# Patient Record
Sex: Male | Born: 1935 | Race: White | Hispanic: No | Marital: Married | Smoking: Never smoker
Health system: Southern US, Community
[De-identification: ages and names within clinical notes are randomized; demographics above are authoritative.]

## PROBLEM LIST (undated history)

## (undated) DIAGNOSIS — A159 Respiratory tuberculosis unspecified: Secondary | ICD-10-CM

## (undated) DIAGNOSIS — I639 Cerebral infarction, unspecified: Secondary | ICD-10-CM

## (undated) DIAGNOSIS — I10 Essential (primary) hypertension: Secondary | ICD-10-CM

## (undated) DIAGNOSIS — K4090 Unilateral inguinal hernia, without obstruction or gangrene, not specified as recurrent: Secondary | ICD-10-CM

## (undated) DIAGNOSIS — Z923 Personal history of irradiation: Secondary | ICD-10-CM

## (undated) DIAGNOSIS — G4700 Insomnia, unspecified: Principal | ICD-10-CM

## (undated) DIAGNOSIS — H269 Unspecified cataract: Secondary | ICD-10-CM

## (undated) DIAGNOSIS — R21 Rash and other nonspecific skin eruption: Secondary | ICD-10-CM

## (undated) DIAGNOSIS — C029 Malignant neoplasm of tongue, unspecified: Secondary | ICD-10-CM

## (undated) DIAGNOSIS — Z978 Presence of other specified devices: Secondary | ICD-10-CM

## (undated) DIAGNOSIS — E785 Hyperlipidemia, unspecified: Secondary | ICD-10-CM

## (undated) DIAGNOSIS — E119 Type 2 diabetes mellitus without complications: Secondary | ICD-10-CM

## (undated) DIAGNOSIS — T884XXA Failed or difficult intubation, initial encounter: Secondary | ICD-10-CM

## (undated) HISTORY — DX: Essential (primary) hypertension: I10

## (undated) HISTORY — DX: Type 2 diabetes mellitus without complications: E11.9

## (undated) HISTORY — PX: EYE SURGERY: SHX253

## (undated) HISTORY — DX: Insomnia, unspecified: G47.00

## (undated) HISTORY — DX: Unilateral inguinal hernia, without obstruction or gangrene, not specified as recurrent: K40.90

## (undated) HISTORY — DX: Unspecified cataract: H26.9

## (undated) HISTORY — DX: Hyperlipidemia, unspecified: E78.5

## (undated) HISTORY — DX: Rash and other nonspecific skin eruption: R21

## (undated) HISTORY — DX: Malignant neoplasm of tongue, unspecified: C02.9

## (undated) HISTORY — DX: Cerebral infarction, unspecified: I63.9

---

## 1999-02-14 DIAGNOSIS — I639 Cerebral infarction, unspecified: Secondary | ICD-10-CM

## 1999-02-14 HISTORY — DX: Cerebral infarction, unspecified: I63.9

## 2000-10-28 ENCOUNTER — Inpatient Hospital Stay (HOSPITAL_COMMUNITY): Admission: EM | Admit: 2000-10-28 | Discharge: 2000-10-31 | Payer: Self-pay | Admitting: Emergency Medicine

## 2000-10-28 ENCOUNTER — Encounter: Payer: Self-pay | Admitting: Emergency Medicine

## 2000-11-02 ENCOUNTER — Encounter: Admission: RE | Admit: 2000-11-02 | Discharge: 2000-11-12 | Payer: Self-pay | Admitting: Neurology

## 2004-12-27 ENCOUNTER — Ambulatory Visit: Payer: Self-pay | Admitting: Sports Medicine

## 2005-01-31 ENCOUNTER — Ambulatory Visit: Payer: Self-pay | Admitting: Sports Medicine

## 2011-09-19 DIAGNOSIS — E785 Hyperlipidemia, unspecified: Secondary | ICD-10-CM | POA: Diagnosis not present

## 2011-09-19 DIAGNOSIS — I10 Essential (primary) hypertension: Secondary | ICD-10-CM | POA: Diagnosis not present

## 2011-09-19 DIAGNOSIS — E119 Type 2 diabetes mellitus without complications: Secondary | ICD-10-CM | POA: Diagnosis not present

## 2011-09-25 DIAGNOSIS — Z1212 Encounter for screening for malignant neoplasm of rectum: Secondary | ICD-10-CM | POA: Diagnosis not present

## 2011-09-27 DIAGNOSIS — E11339 Type 2 diabetes mellitus with moderate nonproliferative diabetic retinopathy without macular edema: Secondary | ICD-10-CM | POA: Diagnosis not present

## 2011-09-27 DIAGNOSIS — Z125 Encounter for screening for malignant neoplasm of prostate: Secondary | ICD-10-CM | POA: Diagnosis not present

## 2011-09-27 DIAGNOSIS — Z0000 Encounter for general adult medical examination without abnormal findings: Secondary | ICD-10-CM | POA: Diagnosis not present

## 2012-01-22 DIAGNOSIS — E1139 Type 2 diabetes mellitus with other diabetic ophthalmic complication: Secondary | ICD-10-CM | POA: Diagnosis not present

## 2012-01-22 DIAGNOSIS — H35049 Retinal micro-aneurysms, unspecified, unspecified eye: Secondary | ICD-10-CM | POA: Diagnosis not present

## 2012-01-22 DIAGNOSIS — E11339 Type 2 diabetes mellitus with moderate nonproliferative diabetic retinopathy without macular edema: Secondary | ICD-10-CM | POA: Diagnosis not present

## 2012-01-25 ENCOUNTER — Telehealth (INDEPENDENT_AMBULATORY_CARE_PROVIDER_SITE_OTHER): Payer: Self-pay | Admitting: General Surgery

## 2012-01-25 NOTE — Telephone Encounter (Signed)
Called and left message for patient to return call. Need to know if he was seen recently by any physician who confirmed he has an inguinal hernia. Also need to know if any tests have been performed as they are needed for review before appointment on 01/31/12 with Dr. Derrell Lolling.

## 2012-01-26 ENCOUNTER — Telehealth (INDEPENDENT_AMBULATORY_CARE_PROVIDER_SITE_OTHER): Payer: Self-pay | Admitting: General Surgery

## 2012-01-26 NOTE — Telephone Encounter (Signed)
Pt returned call; he has not seen physician but is self-diagnosed.  He has been able to reduce the hernia twice and will provide a full history at the time of his appt.

## 2012-01-31 ENCOUNTER — Ambulatory Visit (INDEPENDENT_AMBULATORY_CARE_PROVIDER_SITE_OTHER): Payer: Medicare Other | Admitting: General Surgery

## 2012-01-31 ENCOUNTER — Encounter (INDEPENDENT_AMBULATORY_CARE_PROVIDER_SITE_OTHER): Payer: Self-pay | Admitting: General Surgery

## 2012-01-31 VITALS — BP 118/68 | HR 70 | Temp 97.8°F | Resp 16 | Ht 74.0 in | Wt 186.1 lb

## 2012-01-31 DIAGNOSIS — K4090 Unilateral inguinal hernia, without obstruction or gangrene, not specified as recurrent: Secondary | ICD-10-CM

## 2012-01-31 NOTE — Patient Instructions (Signed)
Your examination reveals a reducible right inguinal hernia. There is no evidence of hernia on the side.  You will be scheduled for open repair of your right inguinal hernia with mesh.    Inguinal Hernia, Adult Muscles help keep everything in the body in its proper place. But if a weak spot in the muscles develops, something can poke through. That is called a hernia. When this happens in the lower part of the belly (abdomen), it is called an inguinal hernia. (It takes its name from a part of the body in this region called the inguinal canal.) A weak spot in the wall of muscles lets some fat or part of the small intestine bulge through. An inguinal hernia can develop at any age. Men get them more often than women. CAUSES  In adults, an inguinal hernia develops over time.  It can be triggered by:  Suddenly straining the muscles of the lower abdomen.  Lifting heavy objects.  Straining to have a bowel movement. Difficult bowel movements (constipation) can lead to this.  Constant coughing. This may be caused by smoking or lung disease.  Being overweight.  Being pregnant.  Working at a job that requires long periods of standing or heavy lifting.  Having had an inguinal hernia before. One type can be an emergency situation. It is called a strangulated inguinal hernia. It develops if part of the small intestine slips through the weak spot and cannot get back into the abdomen. The blood supply can be cut off. If that happens, part of the intestine may die. This situation requires emergency surgery. SYMPTOMS  Often, a small inguinal hernia has no symptoms. It is found when a healthcare provider does a physical exam. Larger hernias usually have symptoms.   In adults, symptoms may include:  A lump in the groin. This is easier to see when the person is standing. It might disappear when lying down.  In men, a lump in the scrotum.  Pain or burning in the groin. This occurs especially when  lifting, straining or coughing.  A dull ache or feeling of pressure in the groin.  Signs of a strangulated hernia can include:  A bulge in the groin that becomes very painful and tender to the touch.  A bulge that turns red or purple.  Fever, nausea and vomiting.  Inability to have a bowel movement or to pass gas. DIAGNOSIS  To decide if you have an inguinal hernia, a healthcare provider will probably do a physical examination.  This will include asking questions about any symptoms you have noticed.  The healthcare provider might feel the groin area and ask you to cough. If an inguinal hernia is felt, the healthcare provider may try to slide it back into the abdomen.  Usually no other tests are needed. TREATMENT  Treatments can vary. The size of the hernia makes a difference. Options include:  Watchful waiting. This is often suggested if the hernia is small and you have had no symptoms.  No medical procedure will be done unless symptoms develop.  You will need to watch closely for symptoms. If any occur, contact your healthcare provider right away.  Surgery. This is used if the hernia is larger or you have symptoms.  Open surgery. This is usually an outpatient procedure (you will not stay overnight in a hospital). An cut (incision) is made through the skin in the groin. The hernia is put back inside the abdomen. The weak area in the muscles is then repaired  by herniorrhaphy or hernioplasty. Herniorrhaphy: in this type of surgery, the weak muscles are sewn back together. Hernioplasty: a patch or mesh is used to close the weak area in the abdominal wall.  Laparoscopy. In this procedure, a surgeon makes small incisions. A thin tube with a tiny video camera (called a laparoscope) is put into the abdomen. The surgeon repairs the hernia with mesh by looking with the video camera and using two long instruments. HOME CARE INSTRUCTIONS   After surgery to repair an inguinal hernia:  You  will need to take pain medicine prescribed by your healthcare provider. Follow all directions carefully.  You will need to take care of the wound from the incision.  Your activity will be restricted for awhile. This will probably include no heavy lifting for several weeks. You also should not do anything too active for a few weeks. When you can return to work will depend on the type of job that you have.  During "watchful waiting" periods, you should:  Maintain a healthy weight.  Eat a diet high in fiber (fruits, vegetables and whole grains).  Drink plenty of fluids to avoid constipation. This means drinking enough water and other liquids to keep your urine clear or pale yellow.  Do not lift heavy objects.  Do not stand for long periods of time.  Quit smoking. This should keep you from developing a frequent cough. SEEK MEDICAL CARE IF:   A bulge develops in your groin area.  You feel pain, a burning sensation or pressure in the groin. This might be worse if you are lifting or straining.  You develop a fever of more than 100.5 F (38.1 C). SEEK IMMEDIATE MEDICAL CARE IF:   Pain in the groin increases suddenly.  A bulge in the groin gets bigger suddenly and does not go down.  For men, there is sudden pain in the scrotum. Or, the size of the scrotum increases.  A bulge in the groin area becomes red or purple and is painful to touch.  You have nausea or vomiting that does not go away.  You feel your heart beating much faster than normal.  You cannot have a bowel movement or pass gas.  You develop a fever of more than 102.0 F (38.9 C). Document Released: 06/18/2008 Document Revised: 04/24/2011 Document Reviewed: 06/18/2008 [facility] Patient Information 2013 [place], [place].

## 2012-01-31 NOTE — Progress Notes (Signed)
Patient ID: Brent Franklin, male   DOB: August 17, 1931, 78 y.o.   MRN: [id number]  Chief Complaint  Patient presents with  . Inguinal Hernia    HPI Brent Franklin is a 78 y.o. male.  He is a self-referred for evaluation and management of a symptomatic right inguinal hernia. Dr. Sigmund Hazel his primary care physician. He also sees Fawn Kirk periodically for surveillance of his diabetic retinopathy.  Park Breed has never had a hernia before. He was lifting some furniture at church recently and developed right groin pain, a small bulge and had to stop and push it back in. He's had a second episode of incarceration that he had to recline in his car and push it back in. No abdominal pain or vomiting. He feels fine today. He called and set up an appointment to see me.  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 No major surgical problems. HPI  Past Medical History  Diagnosis Date  . Diabetes mellitus without complication   . Hyperlipidemia   . Stroke 2001  . Inguinal hernia     Past Surgical History  Procedure Date  . Eye surgery 2000, 2001    cataract    Family History  Problem Relation Age of Onset  . Cancer Mother     breast    Social History History  Substance Use Topics  . Smoking status: Never Smoker   . Smokeless tobacco: Never Used  . Alcohol Use: Yes     Comment: glass of wine - 4 days a week    No Known Allergies  Current Outpatient Prescriptions  Medication Sig Dispense Refill  . aspirin 81 MG tablet Take 81 mg by mouth daily.      Marland Kitchen glipiZIDE (GLUCOTROL) 5 MG tablet Take 5 mg by mouth daily.      Marland Kitchen LIPITOR 10 MG tablet Daily.      . metFORMIN (GLUCOPHAGE) 850 MG tablet Take 850 mg by mouth 2 (two) times daily with a meal.      . valsartan-hydrochlorothiazide (DIOVAN-HCT) 320-12.5 MG per tablet Take 1 tablet by mouth daily.        Review of Systems Review of Systems  Constitutional:  Negative for fever, chills and unexpected weight change.  HENT: Negative for hearing loss, congestion, sore throat, trouble swallowing and voice change.   Eyes: Negative for visual disturbance.  Respiratory: Negative for cough and wheezing.   Cardiovascular: Negative for chest pain, palpitations and leg swelling.  Gastrointestinal: Negative for nausea, vomiting, abdominal pain, diarrhea, constipation, blood in stool, abdominal distention, anal bleeding and rectal pain.  Genitourinary: Negative for hematuria and difficulty urinating.  Musculoskeletal: Negative for arthralgias.  Skin: Negative for rash and wound.  Neurological: Negative for seizures, syncope, weakness and headaches.  Hematological: Negative for adenopathy. Does not bruise/bleed easily.  Psychiatric/Behavioral: Negative for confusion.    Blood pressure 118/68, pulse 70, temperature 97.8 F (36.6 C), temperature source Temporal, resp. rate 16, height 6\' 2"  (1.88 m), weight 186 lb 2 oz (84.426 kg).  Physical Exam Physical Exam  Constitutional: He is oriented to person, place, and time. He appears well-developed and well-nourished. No distress.  HENT:  Head: Normocephalic.  Nose: Nose normal.  Mouth/Throat: No oropharyngeal exudate.  Eyes: Conjunctivae normal and EOM are normal. Pupils are equal, round, and reactive to light. Right eye exhibits no discharge. Left eye exhibits no discharge. No scleral icterus.  Neck: Normal range of motion. Neck supple.  No JVD present. No tracheal deviation present. No thyromegaly present.  Cardiovascular: Normal rate, regular rhythm, normal heart sounds and intact distal pulses.   No murmur heard. Pulmonary/Chest: Effort normal and breath sounds normal. No stridor. No respiratory distress. He has no wheezes. He has no rales. He exhibits no tenderness.  Abdominal: Soft. Bowel sounds are normal. He exhibits no distension and no mass. There is no tenderness. There is no rebound and no guarding.        Diastases recti  Genitourinary:       Medium size right inguinal hernia, does not extend into the scrotum, does not extend past the external ring. Easily reducible. No evidence of hernia on the left side. Penis scrotum and testes are normal. No scrotal mass.  Musculoskeletal: Normal range of motion. He exhibits no edema and no tenderness.  Lymphadenopathy:    He has no cervical adenopathy.  Neurological: He is alert and oriented to person, place, and time. He has normal reflexes. Coordination normal.  Skin: Skin is warm and dry. No rash noted. He is not diaphoretic. No erythema. No pallor.  Psychiatric: He has a normal mood and affect. His behavior is normal. Judgment and thought content normal.    Data Reviewed None  Assessment    Right inguinal hernia, recent onset, two episodes of painful incarceration, self reduced. Elective repair  is indicated  Type 2 diabetes, controlled on oral medication  Hypertension, controlled  Prior cerebellar stroke without residual  History cataract surgery  History diabetic retinopathy    Plan    He would like to have his inguinal hernia repair performed in the near future, which is appropriate considering the recent episodes of incarceration. I spent a long time discussing this. I discussed Cytogeneticist, laparoscopic repair, philosophy and experience with the use of mesh. We talked about long-term results and short term disability issues. At the end of the conversation he stated that he would like to have an open repair of his right inguinal hernia with mesh.  We will schedule him for elective open repair of right inguinal hernia with mesh under general anesthesia in the near future.  I discussed the indications, details, techniques, and the numerous risks of the surgery with him. I reviewed patient information booklets and diagrams. He has an excellent understanding of these issues because of his medical background. His questions  were answered. He agrees with this plan.       Angelia Mould. Derrell Lolling, M.D., [facility] General and Minimally invasive Surgery Breast and Colorectal Surgery Office:   [phone] Pager:   [phone]  01/31/2012, 5:01 PM

## 2012-02-01 ENCOUNTER — Encounter (HOSPITAL_COMMUNITY): Payer: Self-pay | Admitting: Pharmacy Technician

## 2012-02-05 ENCOUNTER — Encounter (HOSPITAL_COMMUNITY)
Admission: RE | Admit: 2012-02-05 | Discharge: 2012-02-05 | Disposition: A | Discharge status: Home / Self Care | Payer: Medicare Other | Source: Ambulatory Visit | Attending: General Surgery | Admitting: General Surgery

## 2012-02-05 ENCOUNTER — Ambulatory Visit (HOSPITAL_COMMUNITY)
Admission: RE | Admit: 2012-02-05 | Discharge: 2012-02-05 | Disposition: A | Discharge status: Home / Self Care | Payer: Medicare Other | Source: Ambulatory Visit | Attending: Anesthesiology | Admitting: Anesthesiology

## 2012-02-05 ENCOUNTER — Encounter (HOSPITAL_COMMUNITY): Payer: Self-pay

## 2012-02-05 DIAGNOSIS — K4090 Unilateral inguinal hernia, without obstruction or gangrene, not specified as recurrent: Secondary | ICD-10-CM | POA: Diagnosis not present

## 2012-02-05 DIAGNOSIS — Z8673 Personal history of transient ischemic attack (TIA), and cerebral infarction without residual deficits: Secondary | ICD-10-CM | POA: Diagnosis not present

## 2012-02-05 DIAGNOSIS — E119 Type 2 diabetes mellitus without complications: Secondary | ICD-10-CM | POA: Diagnosis not present

## 2012-02-05 DIAGNOSIS — E785 Hyperlipidemia, unspecified: Secondary | ICD-10-CM | POA: Diagnosis not present

## 2012-02-05 DIAGNOSIS — Z01818 Encounter for other preprocedural examination: Secondary | ICD-10-CM | POA: Diagnosis not present

## 2012-02-05 DIAGNOSIS — I10 Essential (primary) hypertension: Secondary | ICD-10-CM | POA: Diagnosis not present

## 2012-02-05 DIAGNOSIS — Z9849 Cataract extraction status, unspecified eye: Secondary | ICD-10-CM | POA: Diagnosis not present

## 2012-02-05 LAB — CBC
MCH: 28.7 pg (ref 26.0–34.0)
MCHC: 32.7 g/dL (ref 30.0–36.0)
Platelets: 163 10*3/uL (ref 150–400)
RBC: 4.81 MIL/uL (ref 4.22–5.81)

## 2012-02-05 LAB — BASIC METABOLIC PANEL
Calcium: 9.2 mg/dL (ref 8.4–10.5)
GFR calc Af Amer: 89 mL/min — ABNORMAL LOW (ref >90)
GFR calc non Af Amer: 77 mL/min — ABNORMAL LOW (ref >90)
Glucose, Bld: 231 mg/dL — ABNORMAL HIGH (ref 70–99)
Sodium: 138 mEq/L (ref 135–145)

## 2012-02-05 LAB — SURGICAL PCR SCREEN: MRSA, PCR: NEGATIVE

## 2012-02-05 NOTE — Pre-Procedure Instructions (Signed)
[name]  02/05/2012   Your procedure is scheduled on:  Tues, Dec 31 @ 12:20 PM  Report to [facility] at 10:15 AM.  Call this number if you have problems the morning of surgery: [phone]   Remember:   Do not eat food:After Midnight.       Do not wear jewelry  Do not wear lotions, powders, or colognes. You may wear deodorant.  Men may shave face and neck.  Do not bring valuables to the hospital.  Contacts, dentures or bridgework may not be worn into surgery.  Leave suitcase in the car. After surgery it may be brought to your room.  For patients admitted to the hospital, checkout time is 11:00 AM the day of discharge.   Patients discharged the day of surgery will not be allowed to drive home.    Special Instructions: Shower using CHG 2 nights before surgery and the night before surgery.  If you shower the day of surgery use CHG.  Use special wash - you have one bottle of CHG for all showers.  You should use approximately 1/3 of the bottle for each shower.   Please read over the following fact sheets that you were given: Pain Booklet, Coughing and Deep Breathing, MRSA Information and Surgical Site Infection Prevention

## 2012-02-08 NOTE — H&P (Signed)
Brent Franklin    MRN: [id number]   Description: 78 year old male  Provider: Ernestene Mention, MD  Department: [facility]       Diagnoses     Right inguinal hernia   - Primary    550.90        Vitals    BP Pulse Temp Resp Ht Wt    118/68 70 97.8 F (36.6 C) (Temporal) 16 6\' 2"  (1.88 m) 186 lb 2 oz (84.426 kg)   BMI -23.90 kg/m2                 History and Physical   Ernestene Mention, MD  Patient ID: Brent Franklin, male   DOB: 1931/09/24, 78 y.o.   MRN: [id number]             HPI Brent Franklin is a 78 y.o. male.  He is a self-referred for evaluation and management of a symptomatic right inguinal hernia. Dr. Felipa Eth his primary care physician. He also sees Fawn Kirk periodically for surveillance of his diabetic retinopathy.   Park Breed has never had a hernia before. He was lifting some furniture at church recently and developed right groin pain, a small bulge and had to stop and push it back in. He's had a second episode of incarceration that he had to recline in his car and push it back in. No abdominal pain or vomiting. He feels fine today. He called and set up an appointment to see me.   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 No major surgical problems.       Past Medical History   Diagnosis  Date   .  Diabetes mellitus without complication     .  Hyperlipidemia     .  Stroke  2001   .  Inguinal hernia         Past Surgical History   Procedure  Date   .  Eye surgery  2000, 2001       cataract       Family History   Problem  Relation  Age of Onset   .  Cancer  Mother         breast      Social History History   Substance Use Topics   .  Smoking status:  Never Smoker    .  Smokeless tobacco:  Never Used   .  Alcohol Use:  Yes         Comment: glass of wine - 4 days a week      No Known Allergies    Current Outpatient Prescriptions   Medication   Sig  Dispense  Refill   .  aspirin 81 MG tablet  Take 81 mg by mouth daily.         Marland Kitchen  glipiZIDE (GLUCOTROL) 5 MG tablet  Take 5 mg by mouth daily.         Marland Kitchen  LIPITOR 10 MG tablet  Daily.         .  metFORMIN (GLUCOPHAGE) 850 MG tablet  Take 850 mg by mouth 2 (two) times daily with a meal.         .  valsartan-hydrochlorothiazide (DIOVAN-HCT) 320-12.5 MG per tablet  Take 1 tablet by mouth daily.            Review of Systems   Constitutional: Negative for fever, chills and unexpected  weight change.  HENT: Negative for hearing loss, congestion, sore throat, trouble swallowing and voice change.   Eyes: Negative for visual disturbance.  Respiratory: Negative for cough and wheezing.   Cardiovascular: Negative for chest pain, palpitations and leg swelling.  Gastrointestinal: Negative for nausea, vomiting, abdominal pain, diarrhea, constipation, blood in stool, abdominal distention, anal bleeding and rectal pain.  Genitourinary: Negative for hematuria and difficulty urinating.  Musculoskeletal: Negative for arthralgias.  Skin: Negative for rash and wound.  Neurological: Negative for seizures, syncope, weakness and headaches.  Hematological: Negative for adenopathy. Does not bruise/bleed easily.  Psychiatric/Behavioral: Negative for confusion.    Blood pressure 118/68, pulse 70, temperature 97.8 F (36.6 C), temperature source Temporal, resp. rate 16, height 6\' 2"  (1.88 m), weight 186 lb 2 oz (84.426 kg).   Physical Exam   Constitutional: He is oriented to person, place, and time. He appears well-developed and well-nourished. No distress.  HENT:   Head: Normocephalic.   Nose: Nose normal.   Mouth/Throat: No oropharyngeal exudate.  Eyes: Conjunctivae normal and EOM are normal. Pupils are equal, round, and reactive to light. Right eye exhibits no discharge. Left eye exhibits no discharge. No scleral icterus.  Neck: Normal range of motion. Neck supple. No JVD present. No tracheal deviation  present. No thyromegaly present.  Cardiovascular: Normal rate, regular rhythm, normal heart sounds and intact distal pulses.    No murmur heard. Pulmonary/Chest: Effort normal and breath sounds normal. No stridor. No respiratory distress. He has no wheezes. He has no rales. He exhibits no tenderness.  Abdominal: Soft. Bowel sounds are normal. He exhibits no distension and no mass. There is no tenderness. There is no rebound and no guarding.       Diastases recti  Genitourinary:       Medium size right inguinal hernia, does not extend into the scrotum, does not extend past the external ring. Easily reducible. No evidence of hernia on the left side. Penis scrotum and testes are normal. No scrotal mass.  Musculoskeletal: Normal range of motion. He exhibits no edema and no tenderness.  Lymphadenopathy:    He has no cervical adenopathy.  Neurological: He is alert and oriented to person, place, and time. He has normal reflexes. Coordination normal.  Skin: Skin is warm and dry. No rash noted. He is not diaphoretic. No erythema. No pallor.  Psychiatric: He has a normal mood and affect. His behavior is normal. Judgment and thought content normal.       Assessment Right inguinal hernia, recent onset, two episodes of painful incarceration, self reduced. Elective repair  is indicated   Type 2 diabetes, controlled on oral medication   Hypertension, controlled   Prior cerebellar stroke without residual   History cataract surgery   History diabetic retinopathy   Plan He would like to have his inguinal hernia repair performed in the near future, which is appropriate considering the recent episodes of incarceration. I spent a long time discussing this. I discussed Cytogeneticist, laparoscopic repair, philosophy and experience with the use of mesh. We talked about long-term results and short term disability issues. At the end of the conversation he stated that he would like to have an open  repair of his right inguinal hernia with mesh.   We will schedule him for elective open repair of right inguinal hernia with mesh under general anesthesia in the near future.   I discussed the indications, details, techniques, and the numerous risks of the surgery with him. I reviewed patient information  booklets and diagrams. He has an excellent understanding of these issues because of his medical background. His questions were answered. He agrees with this plan.       Angelia Mould. Derrell Lolling, M.D., [facility] General and Minimally invasive Surgery Breast and Colorectal Surgery Office:   [phone] Pager:   [phone]

## 2012-02-12 MED ORDER — CEFAZOLIN SODIUM-DEXTROSE 2-3 GM-% IV SOLR
2.0000 g | INTRAVENOUS | Status: AC
  Administered 2012-02-13: 2 g via INTRAVENOUS
  Filled 2012-02-12: qty 50

## 2012-02-12 MED ORDER — HEPARIN SODIUM (PORCINE) 5000 UNIT/ML IJ SOLN
5000.0000 [IU] | Freq: Once | INTRAMUSCULAR | Status: DC
  Filled 2012-02-12: qty 1

## 2012-02-12 MED ORDER — CHLORHEXIDINE GLUCONATE 4 % EX LIQD: 1.0000 "application " | Freq: Once | CUTANEOUS | Status: DC

## 2012-02-13 ENCOUNTER — Ambulatory Visit (HOSPITAL_COMMUNITY): Payer: Medicare Other | Admitting: *Deleted

## 2012-02-13 ENCOUNTER — Ambulatory Visit (HOSPITAL_COMMUNITY)
Admission: RE | Admit: 2012-02-13 | Discharge: 2012-02-13 | Disposition: A | Discharge status: Home / Self Care | Payer: Medicare Other | Source: Ambulatory Visit | Attending: General Surgery | Admitting: General Surgery

## 2012-02-13 ENCOUNTER — Encounter (HOSPITAL_COMMUNITY): Payer: Self-pay | Admitting: *Deleted

## 2012-02-13 ENCOUNTER — Encounter (HOSPITAL_COMMUNITY)
Admission: RE | Disposition: A | Discharge status: Home / Self Care | Payer: Self-pay | Source: Ambulatory Visit | Attending: General Surgery

## 2012-02-13 DIAGNOSIS — Z9849 Cataract extraction status, unspecified eye: Secondary | ICD-10-CM | POA: Insufficient documentation

## 2012-02-13 DIAGNOSIS — Z8673 Personal history of transient ischemic attack (TIA), and cerebral infarction without residual deficits: Secondary | ICD-10-CM | POA: Insufficient documentation

## 2012-02-13 DIAGNOSIS — E119 Type 2 diabetes mellitus without complications: Secondary | ICD-10-CM | POA: Diagnosis not present

## 2012-02-13 DIAGNOSIS — I10 Essential (primary) hypertension: Secondary | ICD-10-CM | POA: Insufficient documentation

## 2012-02-13 DIAGNOSIS — K4090 Unilateral inguinal hernia, without obstruction or gangrene, not specified as recurrent: Secondary | ICD-10-CM | POA: Diagnosis not present

## 2012-02-13 DIAGNOSIS — I6789 Other cerebrovascular disease: Secondary | ICD-10-CM | POA: Diagnosis not present

## 2012-02-13 HISTORY — PX: INGUINAL HERNIA REPAIR: SHX194

## 2012-02-13 HISTORY — PX: INSERTION OF MESH: SHX5868

## 2012-02-13 LAB — GLUCOSE, CAPILLARY
Glucose-Capillary: 106 mg/dL — ABNORMAL HIGH (ref 70–99)
Glucose-Capillary: 110 mg/dL — ABNORMAL HIGH (ref 70–99)

## 2012-02-13 SURGERY — REPAIR, HERNIA, INGUINAL, ADULT
Anesthesia: General | Laterality: Right | Wound class: Clean

## 2012-02-13 MED ORDER — LACTATED RINGERS IV SOLN
INTRAVENOUS | Status: DC
  Administered 2012-02-13 (×3): via INTRAVENOUS

## 2012-02-13 MED ORDER — PHENYLEPHRINE HCL 10 MG/ML IJ SOLN
INTRAMUSCULAR | Status: DC | PRN
  Administered 2012-02-13: 80 ug via INTRAVENOUS

## 2012-02-13 MED ORDER — EPHEDRINE SULFATE 50 MG/ML IJ SOLN
INTRAMUSCULAR | Status: DC | PRN
  Administered 2012-02-13 (×2): 5 mg via INTRAVENOUS

## 2012-02-13 MED ORDER — BUPIVACAINE HCL (PF) 0.5 % IJ SOLN
INTRAMUSCULAR | Status: AC
  Filled 2012-02-13: qty 30

## 2012-02-13 MED ORDER — FENTANYL CITRATE 0.05 MG/ML IJ SOLN
INTRAMUSCULAR | Status: DC | PRN
  Administered 2012-02-13: 50 ug via INTRAVENOUS
  Administered 2012-02-13 (×2): 100 ug via INTRAVENOUS

## 2012-02-13 MED ORDER — HEPARIN SODIUM (PORCINE) 5000 UNIT/ML IJ SOLN
5000.0000 [IU] | Freq: Once | INTRAMUSCULAR | Status: AC
  Administered 2012-02-13: 5000 [IU] via SUBCUTANEOUS

## 2012-02-13 MED ORDER — ROCURONIUM BROMIDE 100 MG/10ML IV SOLN
INTRAVENOUS | Status: DC | PRN
  Administered 2012-02-13: 50 mg via INTRAVENOUS

## 2012-02-13 MED ORDER — ONDANSETRON HCL 4 MG/2ML IJ SOLN
INTRAMUSCULAR | Status: DC | PRN
  Administered 2012-02-13: 4 mg via INTRAVENOUS

## 2012-02-13 MED ORDER — MEPERIDINE HCL 25 MG/ML IJ SOLN: 6.2500 mg | INTRAMUSCULAR | Status: DC | PRN

## 2012-02-13 MED ORDER — PROPOFOL 10 MG/ML IV BOLUS
INTRAVENOUS | Status: DC | PRN
  Administered 2012-02-13: 120 mg via INTRAVENOUS

## 2012-02-13 MED ORDER — OXYCODONE HCL 5 MG PO TABS: 5.0000 mg | ORAL_TABLET | Freq: Once | ORAL | Status: DC | PRN

## 2012-02-13 MED ORDER — DEXAMETHASONE SODIUM PHOSPHATE 4 MG/ML IJ SOLN
INTRAMUSCULAR | Status: DC | PRN
  Administered 2012-02-13: 4 mg via INTRAVENOUS

## 2012-02-13 MED ORDER — ONDANSETRON HCL 4 MG/2ML IJ SOLN: 4.0000 mg | Freq: Once | INTRAMUSCULAR | Status: DC | PRN

## 2012-02-13 MED ORDER — BUPIVACAINE LIPOSOME 1.3 % IJ SUSP
20.0000 mL | INTRAMUSCULAR | Status: AC
  Administered 2012-02-13: 18.5 mL
  Filled 2012-02-13: qty 20

## 2012-02-13 MED ORDER — MIDAZOLAM HCL 5 MG/5ML IJ SOLN
INTRAMUSCULAR | Status: DC | PRN
  Administered 2012-02-13 (×2): 1 mg via INTRAVENOUS

## 2012-02-13 MED ORDER — NEOSTIGMINE METHYLSULFATE 1 MG/ML IJ SOLN
INTRAMUSCULAR | Status: DC | PRN
  Administered 2012-02-13: 5 mg via INTRAVENOUS

## 2012-02-13 MED ORDER — OXYCODONE-ACETAMINOPHEN 7.5-325 MG PO TABS: 1.0000 | ORAL_TABLET | ORAL | Status: DC | PRN

## 2012-02-13 MED ORDER — 0.9 % SODIUM CHLORIDE (POUR BTL) OPTIME
TOPICAL | Status: DC | PRN
  Administered 2012-02-13: 1000 mL

## 2012-02-13 MED ORDER — SODIUM CHLORIDE 0.9 % IJ SOLN
INTRAMUSCULAR | Status: AC
  Filled 2012-02-13: qty 6

## 2012-02-13 MED ORDER — GLYCOPYRROLATE 0.2 MG/ML IJ SOLN
INTRAMUSCULAR | Status: DC | PRN
  Administered 2012-02-13: .6 mg via INTRAVENOUS

## 2012-02-13 MED ORDER — LIDOCAINE-EPINEPHRINE (PF) 1 %-1:200000 IJ SOLN
INTRAMUSCULAR | Status: AC
  Filled 2012-02-13: qty 10

## 2012-02-13 MED ORDER — HYDROMORPHONE HCL PF 1 MG/ML IJ SOLN: 0.2500 mg | INTRAMUSCULAR | Status: DC | PRN

## 2012-02-13 MED ORDER — LIDOCAINE HCL (CARDIAC) 20 MG/ML IV SOLN
INTRAVENOUS | Status: DC | PRN
  Administered 2012-02-13: 100 mg via INTRAVENOUS

## 2012-02-13 MED ORDER — OXYCODONE HCL 5 MG/5ML PO SOLN: 5.0000 mg | Freq: Once | ORAL | Status: DC | PRN

## 2012-02-13 SURGICAL SUPPLY — 47 items
BLADE SURG 10 STRL SS (BLADE) ×3 IMPLANT
BLADE SURG 15 STRL LF DISP TIS (BLADE) ×2 IMPLANT
BLADE SURG 15 STRL SS (BLADE) ×1
BLADE SURG ROTATE 9660 (MISCELLANEOUS) ×3 IMPLANT
CANISTER SUCTION 2500CC (MISCELLANEOUS) ×3 IMPLANT
CHLORAPREP W/TINT 26ML (MISCELLANEOUS) ×3 IMPLANT
CLOTH BEACON ORANGE TIMEOUT ST (SAFETY) ×3 IMPLANT
COVER SURGICAL LIGHT HANDLE (MISCELLANEOUS) ×3 IMPLANT
DERMABOND ADVANCED (GAUZE/BANDAGES/DRESSINGS) ×1
DERMABOND ADVANCED .7 DNX12 (GAUZE/BANDAGES/DRESSINGS) ×2 IMPLANT
DRAIN PENROSE 1/2X12 LTX STRL (WOUND CARE) ×3 IMPLANT
DRAPE LAPAROTOMY TRNSV 102X78 (DRAPE) ×3 IMPLANT
DRAPE UTILITY 15X26 W/TAPE STR (DRAPE) ×6 IMPLANT
ELECT CAUTERY BLADE 6.4 (BLADE) ×3 IMPLANT
ELECT REM PT RETURN 9FT ADLT (ELECTROSURGICAL) ×3
ELECTRODE REM PT RTRN 9FT ADLT (ELECTROSURGICAL) ×2 IMPLANT
GLOVE EUDERMIC 7 POWDERFREE (GLOVE) ×3 IMPLANT
GOWN PREVENTION PLUS XLARGE (GOWN DISPOSABLE) ×3 IMPLANT
GOWN STRL NON-REIN LRG LVL3 (GOWN DISPOSABLE) ×3 IMPLANT
KIT BASIN OR (CUSTOM PROCEDURE TRAY) ×3 IMPLANT
KIT ROOM TURNOVER OR (KITS) ×3 IMPLANT
MESH ULTRAPRO 3X6 7.6X15CM (Mesh General) ×3 IMPLANT
NEEDLE HYPO 25GX1X1/2 BEV (NEEDLE) ×3 IMPLANT
NS IRRIG 1000ML POUR BTL (IV SOLUTION) ×3 IMPLANT
PACK SURGICAL SETUP 50X90 (CUSTOM PROCEDURE TRAY) ×3 IMPLANT
PAD ARMBOARD 7.5X6 YLW CONV (MISCELLANEOUS) ×3 IMPLANT
PENCIL BUTTON HOLSTER BLD 10FT (ELECTRODE) ×3 IMPLANT
SPECIMEN JAR SMALL (MISCELLANEOUS) IMPLANT
SPONGE INTESTINAL PEANUT (DISPOSABLE) ×3 IMPLANT
SPONGE LAP 18X18 X RAY DECT (DISPOSABLE) ×3 IMPLANT
SPONGE LAP 4X18 X RAY DECT (DISPOSABLE) ×3 IMPLANT
SUT MNCRL AB 4-0 PS2 18 (SUTURE) ×3 IMPLANT
SUT PROLENE 2 0 CT2 30 (SUTURE) ×6 IMPLANT
SUT SILK 2 0 (SUTURE) ×2
SUT SILK 2 0 SH (SUTURE) IMPLANT
SUT SILK 2-0 18XBRD TIE 12 (SUTURE) ×2 IMPLANT
SUT VIC AB 2-0 CT1 27 (SUTURE) ×1
SUT VIC AB 2-0 CT1 TAPERPNT 27 (SUTURE) ×2 IMPLANT
SUT VIC AB 3-0 SH 27 (SUTURE) ×1
SUT VIC AB 3-0 SH 27XBRD (SUTURE) ×2 IMPLANT
SUT VICRYL AB 2 0 TIES (SUTURE) ×3 IMPLANT
SYR BULB 3OZ (MISCELLANEOUS) ×3 IMPLANT
SYR CONTROL 10ML LL (SYRINGE) ×3 IMPLANT
TOWEL OR 17X24 6PK STRL BLUE (TOWEL DISPOSABLE) ×3 IMPLANT
TOWEL OR 17X26 10 PK STRL BLUE (TOWEL DISPOSABLE) ×3 IMPLANT
TUBE CONNECTING 12X1/4 (SUCTIONS) ×3 IMPLANT
YANKAUER SUCT BULB TIP NO VENT (SUCTIONS) ×3 IMPLANT

## 2012-02-13 NOTE — Anesthesia Preprocedure Evaluation (Addendum)
Anesthesia Evaluation  Patient identified by MRN, date of birth, ID band Patient awake    Reviewed: Allergy & Precautions, H&P , NPO status , Patient's Chart, lab work & pertinent test results, reviewed documented beta blocker date and time   Airway Mallampati: II TM Distance: >3 FB Neck ROM: Full    Dental  (+) Teeth Intact and Dental Advisory Given   Pulmonary          Cardiovascular hypertension, Pt. on medications     Neuro/Psych    GI/Hepatic   Endo/Other  diabetes, Type 2  Renal/GU      Musculoskeletal   Abdominal   Peds  Hematology   Anesthesia Other Findings   Reproductive/Obstetrics                           Anesthesia Physical Anesthesia Plan  ASA: II  Anesthesia Plan: General   Post-op Pain Management:    Induction: Intravenous  Airway Management Planned: LMA  Additional Equipment:   Intra-op Plan:   Post-operative Plan: Extubation in OR  Informed Consent: I have reviewed the patients History and Physical, chart, labs and discussed the procedure including the risks, benefits and alternatives for the proposed anesthesia with the patient or authorized representative who has indicated his/her understanding and acceptance.   Dental advisory given  Plan Discussed with: CRNA and Surgeon  Anesthesia Plan Comments:        Anesthesia Quick Evaluation

## 2012-02-13 NOTE — Op Note (Signed)
Patient Name:           Brent Franklin   Date of Surgery:        02/13/2012  Pre op Diagnosis:      Right inguinal hernia  Post op Diagnosis:    Direct, sliding, right inguinal hernia  Procedure:                 Repair right inguinal hernia with mesh Armanda Heritage repair)  Surgeon:                     Angelia Mould. Derrell Lolling, M.D., FACS  Assistant:                      none  Operative Indications:   Brent Franklin is a 78 y.o. Retired Development worker, community.Marland Kitchen He is a self-referred for evaluation and management of a symptomatic right inguinal hernia. Dr. Felipa Eth his primary care physician. He also sees Fawn Kirk periodically for surveillance of his diabetic retinopathy.  Brent Franklin has never had a hernia before. He was lifting some furniture at church recently and developed right groin pain, a small bulge and had to stop and push it back in. He's had a second episode of incarceration that he had to recline in his car and push it back in. No abdominal pain or vomiting. He feels fine now. Examination in the office reveals a medium-sized right inguinal hernia that is reducible. Techniques and risks of surgical repair were discussed.  He is admitted for elective repair of his writing hernia. 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 No major surgical problems.   Operative Findings:       There was a moderately large, thickwalled, direct, sliding right inguinal hernia. 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 We were able to separate the testicular artery and vein from the hernia without much difficulty, but the hernia was severely scarred to the vas deferens, and this fibriosis was so intense that it had to be divided and ligated with silk ties.  Procedure in Detail:          Following the induction of general endotracheal anesthesia the patient's lower abdomen and  groin and genitalia were prepped and draped in a sterile fashion. Intravenous antibiotics were given. Surgical time out was performed. A 50% solution of Exparel was then infiltrated into the skin, subcutaneous tissue, and muscle layers during the procedure. A transverse incision was made in the right groin overlying the inguinal canal. Dissection was carried down through the subcutaneous tissue exposing the aponeurosis of the external oblique. The external oblique was incised in the direction of its fibers, opening up the external inguinal ring. The external oblique was dissected away from the underlying structures and self-retaining retractors were placed. The cord structures were very bulky and were mobilized and encircled with a Penrose drain. There was a sensory nerve which was densely associated with cord structures. It was traced back to its emergence from the muscles laterally, clamped, divided and ligated with 2-0 silk tie. The cremasteric muscle fibers were then skeletonized off the cord structures and hernia. I spent a long-time dissecting the hernia away from the cord structures. There was a large lipoma that was found lateral to the cord structures. This was dissected back to the level of the internal ring, clamped, divided and ligated with 2-0 chromic ties.  I then dissected the direct hernia sac away from the cord structures. This  was a large thickwalled sac medial to the cord structures partially filling the floor of the inguinal canal medial to the cord structures. The vas deferens was chronically and severely scarred into the hernia sac and had to be divided and ligated with 2-0 silk ties. The cord structures and testicular artery and vein were otherwise preserved. We slowly dissected the  direct sac back to the level of the internal ring and then reduced it. We then closed the overlying tissues with a running suture of 2-0 Vicryl to hold it in place during the repair. The wound was irrigated with  saline. The floor of the inguinal canal was repaired and reinforced with an onlay graft of ultra Pro mesh. A 3" x 6" piece of mesh was brought to the operative field and trimmed at the corners to accommodate the anatomy of the repair. The mesh was sutured in place with running sutures of 2-0 Prolene and interrupted mattress sutures of 2-0 Prolene. The mesh was sutured so as to generously overlap the fascia at the pubic tubercle, and along the inguinal ligament inferiorly. Medially, superiorly and superolaterally several interrupted mattress sutures of 2-0 Prolene were placed. The mesh was incised laterally so as to wrap around the cord structures at the internal ring. The tails of the mesh were overlapped laterally and several other Prolene sutures were placed. This provided very secure coverage and repair both medial and lateral to the internal ring but allowed adequate opening for the cord structures. There was no bleeding. The wound was irrigated with saline. The external oblique was closed with a running suture of 2-0 Vicryl, placing in the cord structures deep to the external oblique. Scarpa's fascia was closed with 3-0 Vicryl and the skin closed with a running subcuticular suture of 4-0 Monocryl and Dermabond. Patient tolerated the procedure well taken to recovery in stable condition. EBL 10 cc. Complications none. Counts correct.d     [facility]. Derrell Lolling, M.D., FACS General and Minimally Invasive Surgery Breast and Colorectal Surgery  02/13/2012 2:06 PM

## 2012-02-13 NOTE — Preoperative (Signed)
Beta Blockers   Reason not to administer Beta Blockers:Not Applicable 

## 2012-02-13 NOTE — Transfer of Care (Signed)
Immediate Anesthesia Transfer of Care Note  Patient: Brent Franklin  Procedure(s) Performed: Procedure(s) (LRB) with comments: HERNIA REPAIR INGUINAL ADULT (Right) INSERTION OF MESH (N/A)  Patient Location: PACU  Anesthesia Type:General  Level of Consciousness: sedated  Airway & Oxygen Therapy: Patient Spontanous Breathing and Patient connected to face mask oxygen  Post-op Assessment: Report given to PACU RN and Post -op Vital signs reviewed and stable  Post vital signs: Reviewed and stable  Complications: No apparent anesthesia complications

## 2012-02-13 NOTE — Anesthesia Postprocedure Evaluation (Signed)
  Anesthesia Post-op Note  Patient: Brent Franklin  Procedure(s) Performed: Procedure(s) (LRB) with comments: HERNIA REPAIR INGUINAL ADULT (Right) INSERTION OF MESH (N/A)  Patient Location: PACU  Anesthesia Type:General  Level of Consciousness: awake  Airway and Oxygen Therapy: Patient Spontanous Breathing  Post-op Pain: mild  Post-op Assessment: Post-op Vital signs reviewed  Post-op Vital Signs: stable  Complications: No apparent anesthesia complications

## 2012-02-13 NOTE — Interval H&P Note (Signed)
History and Physical Interval Note:  02/13/2012 12:28 PM  Brent Franklin  has presented today for surgery, with the diagnosis of right ingunial hernia  The goals and the various methods of treatment have been discussed with the patient and family. After consideration of risks, benefits and other options for treatment, the patient has consented to  Procedure(s) (LRB) with comments: HERNIA REPAIR INGUINAL ADULT (Right) INSERTION OF MESH (N/A) as a surgical intervention .  The patient's history has been reviewed, patient examined today, no change in status, stable for surgery.  I have reviewed the patient's chart and labs.  Questions were answered to the patient's satisfaction.     Ernestene Mention

## 2012-02-15 ENCOUNTER — Encounter (HOSPITAL_COMMUNITY): Payer: Self-pay | Admitting: General Surgery

## 2012-03-05 ENCOUNTER — Encounter (INDEPENDENT_AMBULATORY_CARE_PROVIDER_SITE_OTHER): Payer: Medicare Other | Admitting: General Surgery

## 2012-03-07 ENCOUNTER — Ambulatory Visit (INDEPENDENT_AMBULATORY_CARE_PROVIDER_SITE_OTHER): Payer: Medicare Other | Admitting: General Surgery

## 2012-03-07 ENCOUNTER — Encounter (INDEPENDENT_AMBULATORY_CARE_PROVIDER_SITE_OTHER): Payer: Self-pay | Admitting: General Surgery

## 2012-03-07 VITALS — BP 142/82 | HR 76 | Temp 97.1°F | Resp 16 | Ht 74.0 in | Wt 184.2 lb

## 2012-03-07 DIAGNOSIS — K4090 Unilateral inguinal hernia, without obstruction or gangrene, not specified as recurrent: Secondary | ICD-10-CM

## 2012-03-07 NOTE — Patient Instructions (Signed)
Your right inguinal hernia incision is healing uneventfully and without obvious complication.  You may resume all normal physical activities without resection  approximately 30 days after the date of surgery.  Give me a call if you have any problems.

## 2012-03-07 NOTE — Progress Notes (Signed)
Patient ID: Brent Franklin, male   DOB: Dec 02, 1931, 79 y.o.   MRN: [id number] History: Dr. Judie Grieve underwent open repair of a right inguinal hernia with mesh : 02/13/2012. He had a large sliding hernia and the vas deferens had to be divided to get the reduction performed. We then performed a standard onlay mesh repair with ultra Pro mesh. He has done very well. Pain has subsided. Ecchymoses have resolved. He feels well  Exam: Patient looks well. No distress Right groin incision is healing normally. Minimal thickening. Hernia repair intact. Penis scrotum and testes normal.  Assessment: Right renal hernia, recovering uneventfully following repair with mesh.  Plan: I discussed the operative findings and technique details of the procedure including division of the vas deferens. Okay to resume normal physical activities after February 1 Return to see me if further problems arise.    Angelia Mould. Derrell Lolling, M.D., [facility] General and Minimally invasive Surgery Breast and Colorectal Surgery Office:   [phone] Pager:   [phone]

## 2012-08-28 DIAGNOSIS — I10 Essential (primary) hypertension: Secondary | ICD-10-CM | POA: Diagnosis not present

## 2012-08-28 DIAGNOSIS — E785 Hyperlipidemia, unspecified: Secondary | ICD-10-CM | POA: Diagnosis not present

## 2012-08-28 DIAGNOSIS — E1169 Type 2 diabetes mellitus with other specified complication: Secondary | ICD-10-CM | POA: Diagnosis not present

## 2012-08-28 DIAGNOSIS — Z125 Encounter for screening for malignant neoplasm of prostate: Secondary | ICD-10-CM | POA: Diagnosis not present

## 2012-09-10 DIAGNOSIS — E11339 Type 2 diabetes mellitus with moderate nonproliferative diabetic retinopathy without macular edema: Secondary | ICD-10-CM | POA: Diagnosis not present

## 2012-09-10 DIAGNOSIS — Z1331 Encounter for screening for depression: Secondary | ICD-10-CM | POA: Diagnosis not present

## 2012-09-10 DIAGNOSIS — E1169 Type 2 diabetes mellitus with other specified complication: Secondary | ICD-10-CM | POA: Diagnosis not present

## 2012-09-10 DIAGNOSIS — E785 Hyperlipidemia, unspecified: Secondary | ICD-10-CM | POA: Diagnosis not present

## 2012-09-10 DIAGNOSIS — I10 Essential (primary) hypertension: Secondary | ICD-10-CM | POA: Diagnosis not present

## 2012-09-10 DIAGNOSIS — Z125 Encounter for screening for malignant neoplasm of prostate: Secondary | ICD-10-CM | POA: Diagnosis not present

## 2012-09-10 DIAGNOSIS — Z0000 Encounter for general adult medical examination without abnormal findings: Secondary | ICD-10-CM | POA: Diagnosis not present

## 2012-09-10 DIAGNOSIS — R972 Elevated prostate specific antigen [PSA]: Secondary | ICD-10-CM | POA: Diagnosis not present

## 2012-09-11 DIAGNOSIS — Z1212 Encounter for screening for malignant neoplasm of rectum: Secondary | ICD-10-CM | POA: Diagnosis not present

## 2013-01-20 DIAGNOSIS — E11339 Type 2 diabetes mellitus with moderate nonproliferative diabetic retinopathy without macular edema: Secondary | ICD-10-CM | POA: Diagnosis not present

## 2013-01-20 DIAGNOSIS — H3560 Retinal hemorrhage, unspecified eye: Secondary | ICD-10-CM | POA: Diagnosis not present

## 2013-01-20 DIAGNOSIS — E1139 Type 2 diabetes mellitus with other diabetic ophthalmic complication: Secondary | ICD-10-CM | POA: Diagnosis not present

## 2013-02-04 DIAGNOSIS — R599 Enlarged lymph nodes, unspecified: Secondary | ICD-10-CM | POA: Diagnosis not present

## 2013-02-04 DIAGNOSIS — IMO0002 Reserved for concepts with insufficient information to code with codable children: Secondary | ICD-10-CM | POA: Diagnosis not present

## 2013-03-02 ENCOUNTER — Ambulatory Visit (INDEPENDENT_AMBULATORY_CARE_PROVIDER_SITE_OTHER): Payer: Medicare Other | Admitting: Internal Medicine

## 2013-03-02 VITALS — BP 142/64 | HR 93 | Temp 99.8°F | Resp 16 | Ht 74.25 in | Wt 179.0 lb

## 2013-03-02 DIAGNOSIS — R509 Fever, unspecified: Secondary | ICD-10-CM

## 2013-03-02 DIAGNOSIS — R05 Cough: Secondary | ICD-10-CM

## 2013-03-02 DIAGNOSIS — R52 Pain, unspecified: Secondary | ICD-10-CM

## 2013-03-02 DIAGNOSIS — E119 Type 2 diabetes mellitus without complications: Secondary | ICD-10-CM

## 2013-03-02 DIAGNOSIS — I10 Essential (primary) hypertension: Secondary | ICD-10-CM

## 2013-03-02 DIAGNOSIS — J09X2 Influenza due to identified novel influenza A virus with other respiratory manifestations: Secondary | ICD-10-CM

## 2013-03-02 DIAGNOSIS — R059 Cough, unspecified: Secondary | ICD-10-CM | POA: Diagnosis not present

## 2013-03-02 LAB — POCT GLYCOSYLATED HEMOGLOBIN (HGB A1C): HEMOGLOBIN A1C: 6.1

## 2013-03-02 LAB — POCT INFLUENZA A/B
INFLUENZA A, POC: POSITIVE
Influenza B, POC: NEGATIVE

## 2013-03-02 LAB — POCT CBC
GRANULOCYTE PERCENT: 86.5 % — AB (ref 37–80)
HCT, POC: 46.8 % (ref 43.5–53.7)
HEMOGLOBIN: 14.4 g/dL (ref 14.1–18.1)
Lymph, poc: 0.8 (ref 0.6–3.4)
MCH: 28.7 pg (ref 27–31.2)
MCHC: 30.8 g/dL — AB (ref 31.8–35.4)
MCV: 93.5 fL (ref 80–97)
MID (cbc): 0.5 (ref 0–0.9)
MPV: 9.5 fL (ref 0–99.8)
POC Granulocyte: 8 — AB (ref 2–6.9)
POC LYMPH PERCENT: 8.6 %L — AB (ref 10–50)
POC MID %: 4.9 % (ref 0–12)
Platelet Count, POC: 179 10*3/uL (ref 142–424)
RBC: 5.01 M/uL (ref 4.69–6.13)
RDW, POC: 14.4 %
WBC: 9.2 10*3/uL (ref 4.6–10.2)

## 2013-03-02 LAB — POCT SEDIMENTATION RATE: POCT SED RATE: 24 mm/hr — AB (ref 0–22)

## 2013-03-02 LAB — POCT RAPID STREP A (OFFICE): RAPID STREP A SCREEN: NEGATIVE

## 2013-03-02 LAB — GLUCOSE, POCT (MANUAL RESULT ENTRY): POC Glucose: 204 mg/dl — AB (ref 70–99)

## 2013-03-02 MED ORDER — HYDROCODONE-ACETAMINOPHEN 7.5-325 MG/15ML PO SOLN: 5.0000 mL | Freq: Four times a day (QID) | ORAL | Status: DC | PRN

## 2013-03-02 MED ORDER — OSELTAMIVIR PHOSPHATE 75 MG PO CAPS: 75.0000 mg | ORAL_CAPSULE | Freq: Two times a day (BID) | ORAL | Status: DC

## 2013-03-02 NOTE — Progress Notes (Signed)
   Subjective:    Patient ID: Brent Franklin, male    DOB: July 15, 1931, 80 y.o.   MRN: [id number]  HPI Acute onset of cough, fever, body aches. Dr. Radene Gunning his internist. He has controlled diabetes, last A1c 6.6 No sob but sputum copious and purulent. No sob, cp.   Review of Systems DM, HTN    Objective:   Physical Exam  Vitals reviewed. Constitutional: He is oriented to person, place, and time. He appears well-developed and well-nourished. No distress.  HENT:  Head: Normocephalic.  Right Ear: External ear normal.  Left Ear: External ear normal.  Nose: Mucosal edema, rhinorrhea and sinus tenderness present. No epistaxis. Right sinus exhibits no maxillary sinus tenderness and no frontal sinus tenderness. Left sinus exhibits no maxillary sinus tenderness and no frontal sinus tenderness.  Mouth/Throat: Uvula swelling present. Posterior oropharyngeal edema and posterior oropharyngeal erythema present.  Cardiovascular: Normal rate.   Pulmonary/Chest: Effort normal. Not tachypneic. No respiratory distress. He has no decreased breath sounds. He has rhonchi. He has no rales.  Lymphadenopathy:       Head (right side): Submental adenopathy present.       Head (left side): Submental and submandibular adenopathy present.  Neurological: He is alert and oriented to person, place, and time. He has normal reflexes. No cranial nerve deficit. He exhibits normal muscle tone. Coordination normal.  Skin: No rash noted.  Psychiatric: He has a normal mood and affect. His behavior is normal.    Results for orders placed in visit on 03/02/13  POCT CBC      Result Value Range   WBC 9.2  4.6 - 10.2 K/uL   Lymph, poc 0.8  0.6 - 3.4   POC LYMPH PERCENT 8.6 (*) 10 - 50 %L   MID (cbc) 0.5  0 - 0.9   POC MID % 4.9  0 - 12 %M   POC Granulocyte 8.0 (*) 2 - 6.9   Granulocyte percent 86.5 (*) 37 - 80 %G   RBC 5.01  4.69 - 6.13 M/uL   Hemoglobin 14.4  14.1 - 18.1 g/dL   HCT, POC 46.8  43.5 - 53.7 %   MCV 93.5  80  - 97 fL   MCH, POC 28.7  27 - 31.2 pg   MCHC 30.8 (*) 31.8 - 35.4 g/dL   RDW, POC 14.4     Platelet Count, POC 179  142 - 424 K/uL   MPV 9.5  0 - 99.8 fL  GLUCOSE, POCT (MANUAL RESULT ENTRY)      Result Value Range   POC Glucose 204 (*) 70 - 99 mg/dl  POCT GLYCOSYLATED HEMOGLOBIN (HGB A1C)      Result Value Range   Hemoglobin A1C 6.1    POCT RAPID STREP A (OFFICE)      Result Value Range   Rapid Strep A Screen Negative  Negative  POCT INFLUENZA A/B      Result Value Range   Influenza A, POC Positive     Influenza B, POC Negative           Assessment & Plan:  Influenza A Tamiflu/Lortab elixir

## 2013-03-02 NOTE — Patient Instructions (Signed)

## 2013-03-02 NOTE — Progress Notes (Signed)
   Subjective:    Patient ID: Brent Franklin, male    DOB: [birth year], 80 y.o.   MRN: [id number]  HPI 80 yr old Caucasian is here with complaints of tender lymph nodes - neck  for two months; minor dysphasia. He states he has sinus press, Nasal/coughl-yellow, headache - right side, fever for three to four days. He has no other complaints.   Review of Systems     Objective:   Physical Exam        Assessment & Plan:

## 2013-03-03 DIAGNOSIS — I10 Essential (primary) hypertension: Secondary | ICD-10-CM | POA: Insufficient documentation

## 2013-03-21 DIAGNOSIS — IMO0002 Reserved for concepts with insufficient information to code with codable children: Secondary | ICD-10-CM | POA: Diagnosis not present

## 2013-03-21 DIAGNOSIS — R599 Enlarged lymph nodes, unspecified: Secondary | ICD-10-CM | POA: Diagnosis not present

## 2013-03-25 DIAGNOSIS — R599 Enlarged lymph nodes, unspecified: Secondary | ICD-10-CM | POA: Diagnosis not present

## 2013-03-26 ENCOUNTER — Other Ambulatory Visit: Payer: Self-pay | Admitting: Otolaryngology

## 2013-03-26 DIAGNOSIS — R599 Enlarged lymph nodes, unspecified: Secondary | ICD-10-CM

## 2013-04-01 ENCOUNTER — Inpatient Hospital Stay: Admission: RE | Admit: 2013-04-01 | Payer: Medicare Other | Source: Ambulatory Visit

## 2013-04-01 ENCOUNTER — Ambulatory Visit
Admission: RE | Admit: 2013-04-01 | Discharge: 2013-04-01 | Disposition: A | Discharge status: Home / Self Care | Payer: Medicare Other | Source: Ambulatory Visit | Attending: Otolaryngology | Admitting: Otolaryngology

## 2013-04-01 DIAGNOSIS — R221 Localized swelling, mass and lump, neck: Secondary | ICD-10-CM | POA: Diagnosis not present

## 2013-04-01 DIAGNOSIS — R220 Localized swelling, mass and lump, head: Secondary | ICD-10-CM | POA: Diagnosis not present

## 2013-04-01 MED ORDER — IOHEXOL 300 MG/ML  SOLN
75.0000 mL | Freq: Once | INTRAMUSCULAR | Status: AC | PRN
  Administered 2013-04-01: 75 mL via INTRAVENOUS

## 2013-04-02 ENCOUNTER — Inpatient Hospital Stay: Admission: RE | Admit: 2013-04-02 | Payer: Medicare Other | Source: Ambulatory Visit

## 2013-04-08 DIAGNOSIS — R599 Enlarged lymph nodes, unspecified: Secondary | ICD-10-CM | POA: Diagnosis not present

## 2013-04-08 DIAGNOSIS — R220 Localized swelling, mass and lump, head: Secondary | ICD-10-CM | POA: Diagnosis not present

## 2013-04-08 DIAGNOSIS — R221 Localized swelling, mass and lump, neck: Secondary | ICD-10-CM | POA: Diagnosis not present

## 2013-04-15 DIAGNOSIS — D3705 Neoplasm of uncertain behavior of pharynx: Secondary | ICD-10-CM | POA: Diagnosis not present

## 2013-04-15 DIAGNOSIS — D3701 Neoplasm of uncertain behavior of lip: Secondary | ICD-10-CM | POA: Diagnosis not present

## 2013-04-15 DIAGNOSIS — C01 Malignant neoplasm of base of tongue: Secondary | ICD-10-CM | POA: Diagnosis not present

## 2013-04-15 DIAGNOSIS — R220 Localized swelling, mass and lump, head: Secondary | ICD-10-CM | POA: Diagnosis not present

## 2013-04-15 DIAGNOSIS — R221 Localized swelling, mass and lump, neck: Secondary | ICD-10-CM | POA: Diagnosis not present

## 2013-04-15 DIAGNOSIS — D3709 Neoplasm of uncertain behavior of other specified sites of the oral cavity: Secondary | ICD-10-CM | POA: Diagnosis not present

## 2013-04-17 ENCOUNTER — Other Ambulatory Visit: Payer: Self-pay | Admitting: Hematology and Oncology

## 2013-04-17 ENCOUNTER — Telehealth: Payer: Self-pay | Admitting: Hematology and Oncology

## 2013-04-17 NOTE — Telephone Encounter (Signed)
C/D 04/17/13 for appt. 04/18/13

## 2013-04-17 NOTE — Telephone Encounter (Signed)
S/W PT WIFE IN REF TO NP APPT ON 04/18/13@1 :30 (PER DR GORSUCH) DX-TONGUE LESION DR G  ASKED DR GORSUCH TO SEE PT

## 2013-04-18 ENCOUNTER — Encounter: Payer: Self-pay | Admitting: *Deleted

## 2013-04-18 ENCOUNTER — Encounter: Payer: Self-pay | Admitting: Radiation Oncology

## 2013-04-18 ENCOUNTER — Ambulatory Visit
Admission: RE | Admit: 2013-04-18 | Discharge: 2013-04-18 | Disposition: A | Discharge status: Home / Self Care | Payer: Medicare Other | Source: Ambulatory Visit | Attending: Radiation Oncology | Admitting: Radiation Oncology

## 2013-04-18 ENCOUNTER — Encounter: Payer: Self-pay | Admitting: Hematology and Oncology

## 2013-04-18 ENCOUNTER — Ambulatory Visit: Payer: Medicare Other

## 2013-04-18 ENCOUNTER — Telehealth: Payer: Self-pay | Admitting: *Deleted

## 2013-04-18 ENCOUNTER — Ambulatory Visit (HOSPITAL_BASED_OUTPATIENT_CLINIC_OR_DEPARTMENT_OTHER): Payer: Medicare Other | Admitting: Hematology and Oncology

## 2013-04-18 ENCOUNTER — Other Ambulatory Visit: Payer: Self-pay | Admitting: Hematology and Oncology

## 2013-04-18 ENCOUNTER — Telehealth: Payer: Self-pay | Admitting: Hematology and Oncology

## 2013-04-18 VITALS — BP 152/61 | HR 66 | Temp 97.4°F | Resp 18 | Ht 74.0 in | Wt 176.0 lb

## 2013-04-18 DIAGNOSIS — C770 Secondary and unspecified malignant neoplasm of lymph nodes of head, face and neck: Secondary | ICD-10-CM | POA: Diagnosis not present

## 2013-04-18 DIAGNOSIS — Z7982 Long term (current) use of aspirin: Secondary | ICD-10-CM | POA: Diagnosis not present

## 2013-04-18 DIAGNOSIS — C01 Malignant neoplasm of base of tongue: Secondary | ICD-10-CM

## 2013-04-18 DIAGNOSIS — E785 Hyperlipidemia, unspecified: Secondary | ICD-10-CM | POA: Diagnosis not present

## 2013-04-18 DIAGNOSIS — I10 Essential (primary) hypertension: Secondary | ICD-10-CM | POA: Insufficient documentation

## 2013-04-18 DIAGNOSIS — C029 Malignant neoplasm of tongue, unspecified: Secondary | ICD-10-CM

## 2013-04-18 DIAGNOSIS — Z8673 Personal history of transient ischemic attack (TIA), and cerebral infarction without residual deficits: Secondary | ICD-10-CM | POA: Insufficient documentation

## 2013-04-18 DIAGNOSIS — Z79899 Other long term (current) drug therapy: Secondary | ICD-10-CM | POA: Diagnosis not present

## 2013-04-18 DIAGNOSIS — E119 Type 2 diabetes mellitus without complications: Secondary | ICD-10-CM | POA: Insufficient documentation

## 2013-04-18 HISTORY — DX: Malignant neoplasm of tongue, unspecified: C02.9

## 2013-04-18 MED ORDER — LARYNGOSCOPY SOLUTION RAD-ONC
15.0000 mL | Freq: Once | TOPICAL | Status: AC
  Administered 2013-04-18: 15 mL via TOPICAL
  Filled 2013-04-18: qty 15

## 2013-04-18 NOTE — Telephone Encounter (Signed)
Message copied by Cathlean Cower on Fri Apr 18, 2013  2:21 PM ------      Message from: [facility], [place]      Created: Fri Apr 18, 2013  1:17 PM      Regarding: need additional test       This patient had tongue biopsy at [facility]      Please call them to add HPV testing, tissue path ID is [id number] ------

## 2013-04-18 NOTE — Patient Instructions (Signed)
Cetuximab injection  What is this medicine?  CETUXIMAB (se TUX i mab) is a chemotherapy drug. It targets a specific protein within cancer cells and stops the cells from growing. It is used to treat colorectal cancer and head and neck cancer.  This medicine may be used for other purposes; ask your health care provider or pharmacist if you have questions.  COMMON BRAND NAME(S): Erbitux  What should I tell my health care provider before I take this medicine?  They need to know if you have any of these conditions:  -heart disease  -history of irregular heartbeat  -history of low levels of calcium, magnesium, or potassium in the blood  -lung or breathing disease, like asthma  -an unusual or allergic reaction to cetuximab, other medicines, foods, dyes, or preservatives  -pregnant or trying to get pregnant  -breast-feeding  How should I use this medicine?  This drug is given as an infusion into a vein. It is administered in a hospital or clinic by a specially trained health care professional.  Talk to your pediatrician regarding the use of this medicine in children. Special care may be needed.  Overdosage: If you think you have taken too much of this medicine contact a poison control center or emergency room at once.  NOTE: This medicine is only for you. Do not share this medicine with others.  What if I miss a dose?  It is important not to miss your dose. Call your doctor or health care professional if you are unable to keep an appointment.  What may interact with this medicine?  Interactions are not expected.  This list may not describe all possible interactions. Give your health care provider a list of all the medicines, herbs, non-prescription drugs, or dietary supplements you use. Also tell them if you smoke, drink alcohol, or use illegal drugs. Some items may interact with your medicine.  What should I watch for while using this medicine?  Visit your doctor or health care professional for regular checks on your  progress. This drug may make you feel generally unwell. This is not uncommon, as chemotherapy can affect healthy cells as well as cancer cells. Report any side effects. Continue your course of treatment even though you feel ill unless your doctor tells you to stop.  This medicine can make you more sensitive to the sun. Keep out of the sun while taking this medicine and for 2 months after the last dose. If you cannot avoid being in the sun, wear protective clothing and use sunscreen. Do not use sun lamps or tanning beds/booths.  You may need blood work done while you are taking this medicine.  In some cases, you may be given additional medicines to help with side effects. Follow all directions for their use.  Call your doctor or health care professional for advice if you get a fever, chills or sore throat, or other symptoms of a cold or flu. Do not treat yourself. This drug decreases your body's ability to fight infections. Try to avoid being around people who are sick.  Avoid taking products that contain aspirin, acetaminophen, ibuprofen, naproxen, or ketoprofen unless instructed by your doctor. These medicines may hide a fever.  Do not become pregnant while taking this medicine. Women should inform their doctor if they wish to become pregnant or think they might be pregnant. There is a potential for serious side effects to an unborn child. Use adequate birth control methods. Avoid pregnancy for at least 6 months after   your last dose. Talk to your health care professional or pharmacist for more information. Do not breast-feed an infant while taking this medicine or during the 2 months after your last dose.  What side effects may I notice from receiving this medicine?  Side effects that you should report to your doctor or health care professional as soon as possible:  -allergic reactions like skin rash, itching or hives, swelling of the face, lips, or tongue  -breathing problems  -changes in vision  -fast, irregular  heartbeat  -feeling faint or lightheaded, falls  -fever, chills  -mouth sores  -trouble passing urine or change in the amount of urine  -unusually weak or tired  Side effects that usually do not require medical attention (report to your doctor or health care professional if they continue or are bothersome):  -changes in skin like acne, cracks, skin dryness  -constipation  -diarrhea  -headache  -nail changes  -nausea, vomiting  -stomach upset  -weight loss  This list may not describe all possible side effects. Call your doctor for medical advice about side effects. You may report side effects to FDA at 1-800-FDA-1088.  Where should I keep my medicine?  This drug is given in a hospital or clinic and will not be stored at home.  NOTE: This sheet is a summary. It may not cover all possible information. If you have questions about this medicine, talk to your doctor, pharmacist, or health care provider.   2014, [employer]. (2009-12-21 14:01:41)

## 2013-04-18 NOTE — Telephone Encounter (Signed)
Left VM for [facility] ph [phone].  Requested HPV add on to Bx .  Asked for return call to confirm request.

## 2013-04-18 NOTE — Progress Notes (Signed)
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 male is here because of newly diagnosed squamous cell carcinoma of the base of tongue. According to the patient, the first initial presentation was due to swallowing difficulty, discomfort and palpable bilateral lymphadenopathy. According to the patient, he start to notice mild enlargement of the lymph node around November of 2014. He did not notice any change in the size. The patient took antibiotics with no improvement. He was referred to ENT and was noted to have abnormalities. CT scan confirmed base of tongue cancer with regional lymph node metastasis. He was referred to [facility] in biopsy from 04/14/2013 confirmed the diagnosis. The patient was recommended consideration for concurrent chemoradiation therapy and elected to return to [facility] for further management. The patient had chronic hearing deficit.  Recently, he complained of mild swallowing difficulties, mild painful swallowing, mild changes in the quality of his voice and 15 pounds abnormal weight loss. He also has a sensation of swallowing discomfort and he takes ibuprofen.  Summary of his oncology history is as follows: Oncology History   Tongue cancer, HPV pending   Primary site: Lip and Oral Cavity (Bilateral)   Staging method: AJCC 7th Edition   Clinical: Stage IVA (T2, N2c, M0) signed by Heath Lark, MD on 04/18/2013  2:21 PM   Summary: Stage IVA (T2, N2c, M0)       Tongue cancer   04/01/2013 Imaging Ct scan of neck showed base of tongue cancer involving vallecula and  bilateral LN metastasis   04/15/2013 Surgery Laryngoscopy and biopsy confirmed Squamous cell carcinoma of base of tongue involving vallecula, favoring the right side proximally and midline-to-left distally near the epiglottis   MEDICAL HISTORY:  Past Medical History  Diagnosis Date  . Diabetes mellitus without complication   . Hyperlipidemia   . Inguinal hernia   . 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 Hypertension   . Cataract   . Tongue cancer 04/18/2013    SURGICAL HISTORY: Past Surgical History  Procedure Laterality Date  . Eye surgery  2000, 2001    cataract  . Inguinal hernia repair  02/13/2012    Procedure: HERNIA REPAIR INGUINAL ADULT;  Surgeon: Adin Hector, MD;  Location: [facility];  Service: General;  Laterality: Right;  . Insertion of mesh  02/13/2012    Procedure: INSERTION OF MESH;  Surgeon: Adin Hector, MD;  Location: [facility];  Service: General;  Laterality: N/A;    SOCIAL HISTORY: History   Social History  . Marital Status: Married    Spouse Name: N/A    Number of Children: N/A  . Years of Education: N/A   Occupational History  . Not on file.   Social History Main Topics  . Smoking status: Never Smoker   . Smokeless tobacco: Never Used  . Alcohol Use: Yes     Comment: glass of wine - 4 days a week  . Drug Use: No  . Sexual Activity: Not on file   Other Topics Concern  . Not on file   Social History Narrative  . No narrative on file    FAMILY HISTORY: Family  History  Problem Relation Age of Onset  . Cancer Mother 28    breast ca  . Stroke Father   . Cancer Brother 70    prostate ca  . Heart disease Brother     ALLERGIES:  has No Known Allergies.  MEDICATIONS:  Current Outpatient Prescriptions  Medication Sig Dispense Refill  . aspirin 81 MG tablet Take 81 mg by mouth daily.      Marland Kitchen atorvastatin (LIPITOR) 10 MG tablet Take 10 mg by mouth daily.      Marland Kitchen glipiZIDE (GLUCOTROL XL) 5 MG 24 hr tablet Take 5 mg by mouth daily.      Marland Kitchen ibuprofen  (ADVIL,MOTRIN) 200 MG tablet Take 200 mg by mouth every 6 (six) hours as needed.      . metFORMIN (GLUCOPHAGE) 850 MG tablet Take 850 mg by mouth 2 (two) times daily with a meal.      . Multiple Vitamins-Minerals (PRESERVISION AREDS 2) CAPS Take 1 capsule by mouth 2 (two) times daily.      . valsartan-hydrochlorothiazide (DIOVAN-HCT) 320-12.5 MG per tablet        No current facility-administered medications for this visit.    REVIEW OF SYSTEMS:   Constitutional: Denies fevers, chills or abnormal night sweats Eyes: Denies blurriness of vision, double vision or watery eyes Respiratory: Denies cough, dyspnea or wheezes Cardiovascular: Denies palpitation, chest discomfort or lower extremity swelling Gastrointestinal:  Denies nausea, heartburn or change in bowel habits Skin: Denies abnormal skin rashes Neurological:Denies numbness, tingling or new weaknesses Behavioral/Psych: Mood is stable, no new changes  All other systems were reviewed with the patient and are negative.  PHYSICAL EXAMINATION: ECOG PERFORMANCE STATUS: 0 - Asymptomatic  Filed Vitals:   04/18/13 1315  BP: 152/61  Pulse: 66  Temp: 97.4 F (36.3 C)  Resp: 18   Filed Weights   04/18/13 1315  Weight: 176 lb (79.833 kg)    GENERAL:alert, no distress and comfortable SKIN: skin color, texture, turgor are normal, no rashes or significant lesions EYES: normal, conjunctiva are pink and non-injected, sclera clear OROPHARYNX:no exudate, no erythema and lips, buccal mucosa, and tongue normal  NECK: supple, thyroid normal size, non-tender, without nodularity LYMPH: Palpable lymphadenopathy in the cervical region. LUNGS: clear to auscultation and percussion with normal breathing effort HEART: regular rate & rhythm and no murmurs and no lower extremity edema ABDOMEN:abdomen soft, non-tender and normal bowel sounds Musculoskeletal:no cyanosis of digits and no clubbing  PSYCH: alert & oriented x 3 with fluent speech NEURO: no  focal motor/sensory deficits  LABORATORY DATA:  I have reviewed the data as listed Lab Results  Component Value Date   WBC 9.2 03/02/2013   HGB 14.4 03/02/2013   HCT 46.8 03/02/2013   MCV 93.5 03/02/2013   PLT 163 02/05/2012   Lab Results  Component Value Date   NA 138 02/05/2012   K 4.5 02/05/2012   CL 101 02/05/2012   CO2 29 02/05/2012    RADIOGRAPHIC STUDIES: I have personally reviewed the radiological images as listed and agreed with the findings in the report. Ct Soft Tissue Neck W Contrast  04/01/2013   CLINICAL DATA:  80 year old male with enlarged lymph nodes, bilateral submandibular nodes. Treated with antibiotics. Initial encounter.  BUN and creatinine were obtained on site at [facility] at [address]Results: BUN 24 mg/dL, Creatinine 0.9 mg/dL.  EXAM: CT NECK WITH CONTRAST  TECHNIQUE: Multidetector CT imaging of the neck was performed using the standard protocol following the bolus administration  of intravenous contrast.  CONTRAST:  75 mL Omnipaque 300.  COMPARISON:  None.  FINDINGS: Negative lung apices except for mild dependent atelectasis. No superior mediastinal lymphadenopathy. Some calcified atherosclerosis of the aortic arch and great vessel origins.  Negative thyroid, larynx, nasopharynx, parapharyngeal spaces, retropharyngeal space, and parotid spaces.  There is a lobulated intermediate density mildly heterogeneously enhancing soft tissue mass centered at the base of tongue and vallecula at near the midline. See series 2, image 44. The lesion tracks anteriorly involving the intrinsic muscles of the tongue mostly to the right of midline. Some of the margins of the mass or indistinct. It has a lobulated component involving the central vallecula on both sides of midline, and adjacent 2 but not definitely affecting the epiglottis. All told, the mass encompasses 39 mm by 33 mm x 38 mm (AP by transverse by CC). The mass involves the right posterior mylohyoid muscle,  and partially effaces the posterior midline wrap for a. The sublingual space otherwise appears unaffected. Surface of the oral tongue does not appear affected.  The sublingual glands do not appear directly involved. No level 1 lymphadenopathy identified. Right level IIa 9 mm node and left level IIa 8 mm node are heterogeneous in density. There is also a small but suspicious right level 2 a node which is rounded with mildly spiculated margins measuring 6 x 8 mm (series 2, image 36 and sagittal image 37. Small level 2 B and level 3 nodes are within normal limits.  Major vascular structures in the neck and at the skullbase are patent. Grossly negative visualized brain parenchyma. Negative visualized orbits soft tissues. Mild left maxillary sinus alveolar recess mucosal thickening. Other Visualized paranasal sinuses and mastoids are clear. No acute osseous abnormality identified. Mild chronic appearing T5 compression fracture.  IMPRESSION: 1. Lobulated oropharynx mass, epicenter at the base of tongue with involvement of the intrinsic tongue muscles, and vallecula along both sides of midline. 39 x 33 x 38 mm. 2. Two small but suspicious right level IIa nodes (up to 9 mm short axis), and a single small but suspicious left level 2a node. 3. Imaging stage T2  N2c.   Electronically Signed   By: Lars Pinks M.D.   On: 04/01/2013 16:25    ASSESSMENT:  Newly diagnosed squamous cell carcinoma of the base of the tongue/vallecula with bilateral lymph node metastasis, HPV N/A  PLAN:  I have a long discussion with the patient regarding approach to his case. I recommend consideration for concurrent chemoradiation therapy. I will get his case presented at the next tumor board. I have requested my nursing staff to contact the pathologist office to add HPV stain for prognostic value. Stage of the disease is to be determined, a PET/CT scan is recommended but the patient declined.  In preparation for treatment, the patient will  need the following tests or referrals, to be arranged #1 Referral to Radiation Oncology for consultation - the patient will see Dr. Isidore Moos today.  #2 Referral to dentist for full dental evaluation and possible dental extraction  #3 Referral for feeding tube placement (I. have extensive discussion with the patient the reason for feeding tube, especially in view of ongoing swallowing difficulties and weight loss) #4 Infusaport placement #5 Referral to Speech Pathologist (the patient is questioning the value for this) #6 Referral to Nutritionist  #7 Referral to chemotherapy class to learn about practical tips while on treatment.   I will see him back in 2 half weeks for further discussion.  Orders Placed This Encounter  Procedures  . Ambulatory referral to General Surgery    Referral Priority:  Routine    Referral Type:  Surgical    Referral Reason:  Specialty Services Required    Referred to Provider:  Adin Hector, MD    Requested Specialty:  General Surgery    Number of Visits Requested:  1  . Amb Referral to Nutrition and Diabetic E    Referral Priority:  Routine    Referral Type:  Consultation    Referral Reason:  Specialty Services Required    Referred to Provider:  Karie Mainland, RD    Number of Visits Requested:  1  . Ambulatory referral to Speech Therapy    Referral Priority:  Routine    Referral Type:  Speech Therapy    Referral Reason:  Specialty Services Required    Requested Specialty:  Speech Pathology    Number of Visits Requested:  1  . Ambulatory referral to Dentistry    Referral Priority:  Routine    Referral Type:  Consultation    Referral Reason:  Specialty Services Required    Requested Specialty:  Dental General Practice    Number of Visits Requested:  1    All questions were answered. The patient knows to call the clinic with any problems, questions or concerns. I spent 55 minutes counseling the patient face to face. The total time spent in the  appointment was 60 minutes and more than 50% was on counseling.     [place], [place], MD 04/18/2013 2:25 PM

## 2013-04-18 NOTE — Progress Notes (Signed)
Head and Neck Cancer Location of Tumor / Histology: Squamous Cell of the Base of Tongue  Patient presented with bilateral neck masses and upper neck which have been present  for 2 months.  "Maybe some voice changes, some globus sensation the last few weeks." as noted by Dr. Nicolette Bang on 04/15/13.  Treated initially with antibiotics with no improvement  Biopsies of Base of Tongue (if applicable) revealed:   04/16/13 Squamous Cell Carcinoma of the Base of Tongue, Moderately Differentiated  Nutrition Status:  Weight changes:   Swallowing status: Since three weeks ago he has experience pain and difficulty swallowing.  No Otalgia, nor hemoptysis  Plans, if any, for PEG tube:  Tobacco/Marijuana/Snuff/ETOH use: Never smoked, 4 Drinks per week, No illicit Drug use  Past/Anticipated interventions by otolaryngology, if any: Biospy of Base of Tongue - Dr. Nicolette Bang  Past/Anticipated interventions by medical oncology, if any: Seen by Dr. Alvy Bimler 04/18/13  Referrals yet, to any of the following?  Social Work?  Dentistry? Referred to Dr. Enrique Sack  Swallowing therapy? Appointment with Almyra Deforest  Nutrition?  Med/Onc? DR Alvy Bimler  PEG placement?   SAFETY ISSUES:  Prior radiation? No  Pacemaker/ICD? No  Possible current pregnancy? N/A  Is the patient on methotrexate? NO  Current Complaints / other details:

## 2013-04-18 NOTE — Progress Notes (Signed)
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 male who is a retired Administrator, [employer], he is a nonsmoker, and he first noted bilateral upper neck masses about 2 months ago. He had no improvement with abx.   No odynophagia, otalgia, hemoptysis.  He does report that for the past 3 weeks he has a had a little bit of dysphagia. He's noticed that his voice has changed and has become more mucousy in nature. He reports about 20 pound weight loss in the past several months. He believes some of this may have been purposeful.  Workup included CT scan on 03-26-13 that revealed a lobulated oropharynx mass, epicenter at the base of tongue with  involvement of the intrinsic tongue muscles, and vallecula along both sides of midline. 39 x 33 x 38 mm.  Two small but suspicious right level IIa nodes (up to 9 mm short axis), and a single small but suspicious left level 2a node.    He initially saw Dr Wilburn Cornelia followed by Dr Nicolette Bang on 04-16-13 at [facility]: Laryngoscopy revealed : The nasal cavity and nasopharynx are unremarkable. The pharyngeal walls, piriform sinuses, vallecula, epiglottis and postcricoid region are normal in appearance. The subglottis and trachea are widely patent. The vocal folds are mobile bilaterally. There is a large friable mass basically centered in the vallecula. This displaces the tongue base anteriorly and the epiglottis posteriorly.   Biopsy on 04-16-13 of "Base of Tongue" showed Squamous cell carcinoma, moderately differentiated. p16 status not recorded thus far.  Notes from [facility] office imply that surgery would  carry a high risk of significant morbidity, and beyond that, patient favors non-surgical alternatives.  He has seen Dr. Alvy Bimler of medical oncology and is leaning toward cetuximab as he has a history of hearing loss and is hesitant to pursue chemotherapy. She is looking into obtaining the patient's P. 16 status   PREVIOUS RADIATION THERAPY: No  PAST MEDICAL HISTORY:  has a past medical history of Diabetes mellitus without complication; Hyperlipidemia; Inguinal hernia; Stroke (2001); Hypertension; Cataract; and Tongue cancer (04/18/2013).    PAST SURGICAL HISTORY: Past Surgical History  Procedure Laterality Date  . Eye surgery  2000, 2001    cataract  . Inguinal hernia repair  02/13/2012    Procedure: HERNIA REPAIR INGUINAL ADULT;  Surgeon: Adin Hector, MD;  Location: [place];  Service: General;  Laterality: Right;  . Insertion of mesh  02/13/2012    Procedure: INSERTION OF MESH;  Surgeon: Adin Hector, MD;  Location: [place];  Service: General;  Laterality: N/A;    FAMILY HISTORY: family history includes Cancer (age of onset: 28) in his brother and mother; Heart disease in his brother; Stroke in his father.  SOCIAL HISTORY:  reports that he has never smoked. He has never used smokeless tobacco. He reports that he drinks alcohol. He reports that he does not use illicit drugs.  ALLERGIES: Review of patient's allergies indicates no known allergies.  MEDICATIONS:  Current Outpatient Prescriptions  Medication Sig Dispense Refill  . aspirin 81 MG tablet Take 81 mg by mouth daily.      Marland Kitchen atorvastatin (LIPITOR)  10 MG tablet Take 10 mg by mouth daily.      Marland Kitchen glipiZIDE (GLUCOTROL XL) 5 MG 24 hr tablet Take 5 mg by mouth daily.      Marland Kitchen ibuprofen (ADVIL,MOTRIN) 200 MG tablet Take 200 mg by mouth every 6 (six) hours as needed.      . metFORMIN (GLUCOPHAGE) 850 MG tablet Take 850 mg by mouth 2 (two) times daily with a meal.      . Multiple Vitamins-Minerals (PRESERVISION AREDS 2) CAPS Take 1  capsule by mouth 2 (two) times daily.      . valsartan-hydrochlorothiazide (DIOVAN-HCT) 320-12.5 MG per tablet        No current facility-administered medications for this encounter.    REVIEW OF SYSTEMS:  Notable for that above.   PHYSICAL EXAM:   Vitals with Age-Percentiles 04/18/2013  Length 0000000 cm  Systolic 0000000  Diastolic 61  Pulse 66  Respiration 18  Weight 79.833 kg  BMI 22.6  VISIT REPORT    General: Alert and oriented, in no acute distress HEENT: Head is normocephalic.  Extraocular movements are intact. Oropharynx -possible irregularity appreciated upon palpation of the right base of tongue. No visible lesions.  Neck: Bilateral palpable level II lymph nodes, on exam they are approximately 2 cm in dimension.  No palpable supraclavicular lymphadenopathy. Heart: Regular in rate and rhythm with no murmurs, rubs, or gallops. Chest: Clear to auscultation bilaterally, with no rhonchi, wheezes, or rales. Abdomen: Soft, nontender, nondistended, with no rigidity or guarding. Extremities: No cyanosis or edema. Lymphatics: As above Musculoskeletal: symmetric strength and muscle tone throughout. Neurologic: Cranial nerves II through XII are grossly intact. No obvious focalities. Speech is fluent. Coordination is intact. Psychiatric: Judgment and insight are intact. Affect is appropriate. After anesthetizing the nasal cavity with topical anesthetic, the flexible endoscope was introduced and passed through the nasal cavity.  There is a distal base of tongue mass that appears to be involving the vallecula bilaterally.  No laryngeal lesions appreciated.  ECOG = 0  0 - Asymptomatic (Fully active, able to carry on all predisease activities without restriction)  1 - Symptomatic but completely ambulatory (Restricted in physically strenuous activity but ambulatory and able to carry out work of a light or sedentary nature. For example, light housework, office work)  2 - Symptomatic, <50% in bed  during the day (Ambulatory and capable of all self care but unable to carry out any work activities. Up and about more than 50% of waking hours)  3 - Symptomatic, >50% in bed, but not bedbound (Capable of only limited self-care, confined to bed or chair 50% or more of waking hours)  4 - Bedbound (Completely disabled. Cannot carry on any self-care. Totally confined to bed or chair)  5 - Death   Eustace Pen MM, Creech RH, Tormey DC, et al. [phone]). "Toxicity and response criteria of the [facility]". [place] Oncol. 5 (6): 649-55   LABORATORY DATA:  Lab Results  Component Value Date   WBC 9.2 03/02/2013   HGB 14.4 03/02/2013   HCT 46.8 03/02/2013   MCV 93.5 03/02/2013   PLT 163 02/05/2012   CMP     Component Value Date/Time   NA 138 02/05/2012 0914   K 4.5 02/05/2012 0914   CL 101 02/05/2012 0914   CO2 29 02/05/2012 0914   GLUCOSE 231* 02/05/2012 0914   BUN 25* 02/05/2012 0914   CREATININE 0.97 02/05/2012 0914   CALCIUM 9.2 02/05/2012 0914   GFRNONAA 77*  02/05/2012 0914   GFRAA 89* 02/05/2012 0914         RADIOGRAPHY: Ct Soft Tissue Neck W Contrast  04/01/2013   CLINICAL DATA:  80 year old male with enlarged lymph nodes, bilateral submandibular nodes. Treated with antibiotics. Initial encounter.  BUN and creatinine were obtained on site at [facility] at [address]Results: BUN 24 mg/dL, Creatinine 0.9 mg/dL.  EXAM: CT NECK WITH CONTRAST  TECHNIQUE: Multidetector CT imaging of the neck was performed using the standard protocol following the bolus administration of intravenous contrast.  CONTRAST:  75 mL Omnipaque 300.  COMPARISON:  None.  FINDINGS: Negative lung apices except for mild dependent atelectasis. No superior mediastinal lymphadenopathy. Some calcified atherosclerosis of the aortic arch and great vessel origins.  Negative thyroid, larynx, nasopharynx, parapharyngeal spaces, retropharyngeal space, and parotid spaces.  There is a lobulated  intermediate density mildly heterogeneously enhancing soft tissue mass centered at the base of tongue and vallecula at near the midline. See series 2, image 44. The lesion tracks anteriorly involving the intrinsic muscles of the tongue mostly to the right of midline. Some of the margins of the mass or indistinct. It has a lobulated component involving the central vallecula on both sides of midline, and adjacent 2 but not definitely affecting the epiglottis. All told, the mass encompasses 39 mm by 33 mm x 38 mm (AP by transverse by CC). The mass involves the right posterior mylohyoid muscle, and partially effaces the posterior midline wrap for a. The sublingual space otherwise appears unaffected. Surface of the oral tongue does not appear affected.  The sublingual glands do not appear directly involved. No level 1 lymphadenopathy identified. Right level IIa 9 mm node and left level IIa 8 mm node are heterogeneous in density. There is also a small but suspicious right level 2 a node which is rounded with mildly spiculated margins measuring 6 x 8 mm (series 2, image 36 and sagittal image 37. Small level 2 B and level 3 nodes are within normal limits.  Major vascular structures in the neck and at the skullbase are patent. Grossly negative visualized brain parenchyma. Negative visualized orbits soft tissues. Mild left maxillary sinus alveolar recess mucosal thickening. Other Visualized paranasal sinuses and mastoids are clear. No acute osseous abnormality identified. Mild chronic appearing T5 compression fracture.  IMPRESSION: 1. Lobulated oropharynx mass, epicenter at the base of tongue with involvement of the intrinsic tongue muscles, and vallecula along both sides of midline. 39 x 33 x 38 mm. 2. Two small but suspicious right level IIa nodes (up to 9 mm short axis), and a single small but suspicious left level 2a node. 3. Imaging stage T2  N2c.   Electronically Signed   By: Lars Pinks M.D.   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   He is an excellent candidate for concurrent cetuximab and radiotherapy. We will discuss him at our new tumor board. Plan is as below:  1a) I had a lengthy discussion with the patient regarding utility of a PETscan for staging and treatment planning. He is concerned about the health care costs to society from obtaining this test, but after lengthy discussion he understands the utility of the PET and will proceed.  1b) he has already seen med/onc to discuss chemotherapy and is choosing cetuximab in light of his hearing loss  2) he has been referred to dentistry for dental evaluation/extractions if needed, as well as dental hygiene counseling, and scatter guards  3) I will refer to social work for social support  4) he has been referred to nutrition for nutrition support  5) I will defer to medical oncology to refer him for PEG tube placement. Patient understands the benefits of this which we discussed in detail and is willing to proceed  6) he has been referred to swallowing therapy for dysphagia prevention   7) Simulation once cleared by dentistry. Anticipate 7 weeks of RT - 70 Gy in 35 fractions.   8) I referred him to physical therapy for baseline measurements, as he will be at risk for lymphedema in his neck  9) The patient was offered enrollment on our single institutional trial investigating open-faced vs close-face head/shoulder masks used to immobilize patients during head and neck radiotherapy. The patient has elected to enroll on this trial.  In regards to the trial, the patient has voluntarily signed copies of the consent forms and all trial related questions were answered.  It was a pleasure meeting the patient today. We discussed the risks, benefits, and side effects of radiotherapy. He understands salvage neck dissection can be  considered if he has residual disease after treatment.  We talked in detail about acute and late effects. He understands that some of the most bothersome acute effects will be significant soreness of the mouth and throat, changes in taste, changes in salivary function, skin irritation, hair loss, dehydration, weight loss and fatigue. We talked about late effects which include but are not necessarily limited to dysphagia, hypothyroidism, dry mouth, trismus, spinal cord/nerve injury, and neck edema. No guarantees of treatment were given. A consent form was signed and placed in the patient's medical record. The patient is enthusiastic about proceeding with treatment. I look forward to participating in the patient's care.   I spent 80 minutes  face to face with the patient and more than 50% of that time was spent in counseling and/or coordination of care.    __________________________________________   Eppie Gibson, MD

## 2013-04-18 NOTE — Telephone Encounter (Signed)
Gave pt appt for MD, chemo class, surgeon, nutrition and talked to Edward [place] from Dr. Enrique Sack , she will call pt once chart has been reviewed

## 2013-04-19 NOTE — Progress Notes (Signed)
Met with patient during initial consults with Drs. Alvy Bimler and Isidore Moos.  Introduced myself as his Navigator, explained my role as a member of the Care Team, provided contact information, encouraged him to contact me with questions/concerns as treatments/procedures begin.  He verbalized understanding.    Showed him location of Dr. Ritta Slot office and [facility] pending future appts, provided him a tour of [facility] and [place] areas, explained treatments and arrival procedures.  He verbalized understanding.  Initiating navigation as L1 patient (new) with this encounter.  Gayleen Orem, RN, BSN, [facility] Head & Neck Oncology Navigator [phone]

## 2013-04-21 ENCOUNTER — Telehealth: Payer: Self-pay | Admitting: *Deleted

## 2013-04-21 ENCOUNTER — Ambulatory Visit (HOSPITAL_COMMUNITY): Payer: Self-pay | Admitting: Dentistry

## 2013-04-21 ENCOUNTER — Encounter (INDEPENDENT_AMBULATORY_CARE_PROVIDER_SITE_OTHER): Payer: Self-pay

## 2013-04-21 ENCOUNTER — Other Ambulatory Visit: Payer: Self-pay | Admitting: *Deleted

## 2013-04-21 ENCOUNTER — Encounter (HOSPITAL_COMMUNITY): Payer: Self-pay | Admitting: Dentistry

## 2013-04-21 ENCOUNTER — Ambulatory Visit: Payer: Medicare Other

## 2013-04-21 ENCOUNTER — Telehealth (INDEPENDENT_AMBULATORY_CARE_PROVIDER_SITE_OTHER): Payer: Self-pay | Admitting: *Deleted

## 2013-04-21 ENCOUNTER — Other Ambulatory Visit (HOSPITAL_COMMUNITY): Payer: Self-pay | Admitting: Dentistry

## 2013-04-21 VITALS — BP 123/74 | HR 67 | Temp 98.2°F

## 2013-04-21 DIAGNOSIS — Z0189 Encounter for other specified special examinations: Secondary | ICD-10-CM | POA: Diagnosis not present

## 2013-04-21 DIAGNOSIS — K036 Deposits [accretions] on teeth: Secondary | ICD-10-CM

## 2013-04-21 DIAGNOSIS — C01 Malignant neoplasm of base of tongue: Secondary | ICD-10-CM | POA: Diagnosis not present

## 2013-04-21 DIAGNOSIS — K0530 Chronic periodontitis, unspecified: Secondary | ICD-10-CM

## 2013-04-21 DIAGNOSIS — K089 Disorder of teeth and supporting structures, unspecified: Secondary | ICD-10-CM

## 2013-04-21 MED ORDER — SODIUM FLUORIDE 1.1 % DT GEL
DENTAL | Status: DC
Start: 2013-04-21 — End: 2013-04-24

## 2013-04-21 NOTE — Progress Notes (Signed)
DENTAL CONSULTATION  Date of Consultation:  04/21/2013 Patient Name:   Brent Franklin Date of Birth:   02-15-1931 Medical Record Number: [id number]  VITALS: BP 123/74  Pulse 67  Temp(Src) 98.2 F (36.8 C) (Oral)   CHIEF COMPLAINT: Patient was referred by Dr. Alvy Bimler for a pre-chemoradiation therapy dental protocol examination.  HPI: Brent Franklin is a 80 year old male recently diagnosed with squamous cell carcinoma of the base of tongue. Patient with anticipated chemoradiation therapy. Patient is now seen as part of a medically necessary pre-chemoradiation therapy dental protocol examination.  The patient currently denies acute toothache, swellings, or abscesses. Patient was last seen by his dentist, Dr. Jerold Coombe, for an exam and cleaning in July, 2014. The patient is seen on a yearly basis.  The patient denies having any unmet dental needs.     PROBLEM LIST: Patient Active Problem List   Diagnosis Date Noted  . Tongue cancer 04/18/2013  . Malignant neoplasm of base of tongue 04/18/2013  . HTN (hypertension) 03/03/2013  . Right inguinal hernia 01/31/2012    PMH: Past Medical History  Diagnosis Date  . Diabetes mellitus without complication   . Hyperlipidemia   . Inguinal hernia   . 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 Hypertension   . Cataract   . Tongue cancer 04/18/2013    Base of tongue    PSH: Past Surgical History  Procedure Laterality Date  . Eye surgery  2000, 2001    cataract  . Inguinal hernia repair  02/13/2012    Procedure: HERNIA REPAIR INGUINAL ADULT;  Surgeon: Adin Hector, MD;  Location: [place];  Service: General;  Laterality: Right;  . Insertion of mesh  02/13/2012    Procedure: INSERTION OF MESH;  Surgeon: Adin Hector, MD;  Location: [place];  Service: General;  Laterality: N/A;    ALLERGIES: No Known Allergies  MEDICATIONS: Current Outpatient Prescriptions  Medication Sig Dispense Refill  . aspirin 81 MG tablet Take 81 mg by mouth daily.      Marland Kitchen  atorvastatin (LIPITOR) 10 MG tablet Take 10 mg by mouth daily.      Marland Kitchen glipiZIDE (GLUCOTROL XL) 5 MG 24 hr tablet Take 5 mg by mouth daily.      Marland Kitchen ibuprofen (ADVIL,MOTRIN) 200 MG tablet Take 200 mg by mouth every 6 (six) hours as needed.      . metFORMIN (GLUCOPHAGE) 850 MG tablet Take 850 mg by mouth 2 (two) times daily with a meal.      . Multiple Vitamins-Minerals (PRESERVISION AREDS 2) CAPS Take 1 capsule by mouth 2 (two) times daily.      . valsartan-hydrochlorothiazide (DIOVAN-HCT) 320-12.5 MG per tablet        No current facility-administered medications for this visit.    LABS: Lab Results  Component Value Date   WBC 9.2 03/02/2013   HGB 14.4 03/02/2013   HCT 46.8 03/02/2013   MCV 93.5 03/02/2013   PLT 163 02/05/2012      Component Value Date/Time   NA 138 02/05/2012 0914   K 4.5 02/05/2012 0914   CL 101 02/05/2012 0914   CO2 29 02/05/2012 0914   GLUCOSE 231* 02/05/2012 0914   BUN 25* 02/05/2012 0914   CREATININE 0.97 02/05/2012 0914   CALCIUM 9.2 02/05/2012 0914   GFRNONAA 77* 02/05/2012 0914   GFRAA 89* 02/05/2012 0914   No results found for this basename: INR, PROTIME   No results found for this basename: PTT  SOCIAL HISTORY: History   Social History  . Marital Status: Married    Spouse Name: N/A    Number of Children: N/A  . Years of Education: N/A   Occupational History  .      Retired-- Internal Medicine   Social History Main Topics  . Smoking status: Never Smoker   . Smokeless tobacco: Never Used  . Alcohol Use: Yes     Comment: glass of wine - 4 days a week  . Drug Use: No  . Sexual Activity: Not on file   Other Topics Concern  . Not on file   Social History Narrative  . No narrative on file    FAMILY HISTORY: Family History  Problem Relation Age of Onset  . Cancer Mother 49    breast ca  . Stroke Father   . Cancer Brother 73    prostate ca  . Heart disease Brother      REVIEW OF SYSTEMS: Reviewed  with patient and included in  dental record.  DENTAL HISTORY: CHIEF COMPLAINT: Patient was referred by Dr. Alvy Bimler for a pre-chemoradiation therapy dental protocol examination.  HPI: Brent Franklin is a 80 year old male recently diagnosed with squamous cell carcinoma of the base of tongue. Patient with anticipated chemoradiation therapy. Patient is now seen as part of a medically necessary pre-chemoradiation therapy dental protocol examination.  The patient currently denies acute toothache, swellings, or abscesses. Patient was last seen by his dentist, Dr. Jerold Coombe, for an exam and cleaning in July, 2014. Patient had been seen by Dr. Laurin Coder prior to Dr. Barrie Dunker. The patient is seen on a yearly basis.  The patient denies having any unmet dental needs.    DENTAL EXAMINATION:  GENERAL:  The patient is a tall, well-developed, well-nourished male in no acute distress. HEAD AND NECK: The patient has bilateral sub-mandibular lymphadenopathy per CT report. The patient denies acute TMJ symptoms. INTRAORAL EXAM: The patient has normal saliva. I do not see any evidence of abscess formation. DENTITION: The patient has an intact dentition.  PERIODONTAL: The patient has chronic periodontitis with minimal plaque accumulations, selective areas of gingival recession and no significant tooth mobility. There is incipient to moderate bone loss. DENTAL CARIES/SUBOPTIMAL RESTORATIONS: There are no dental caries noted. Patient has multiple abfraction/flexure lesions as per dental charting form. ENDODONTIC: The patient denies acute pulpitis symptoms. There is no evidence of periapical pathology. CROWN AND BRIDGE: There are crown the tooth numbers 3, 4, 5, 14, 18, 19, 30, and 31. PROSTHODONTIC: There are no partial dentures. OCCLUSION: The patient has a stable occlusion.  RADIOGRAPHIC INTERPRETATION: An orthopantogram was taken and supplemented with a full series of dental radiographs. The orthopantogram was suboptimal secondary to  patient movement. There are no missing teeth. There is incipient to moderate bone loss. There is no evidence of periapical pathology. There are multiple dental restorations noted. There are multiple cervical radiolucencies consistent with multiple abfraction lesions.   ASSESSMENTS: 1. Squamous cell carcinoma of the base of tongue with anticipated chemoradiation therapy. 2. Pre-chemoradiation therapy dental protocol examination 3. Chronic periodontitis with incipient bone loss 4. Selective areas of gingival recession 5. Accretions. 6. Multiple abfraction lesions 7. Stable occlusion  PLAN/RECOMMENDATIONS: 1. I discussed the risks, benefits, and complications of various treatment options with the patient in relationship to his medical and dental conditions, anticipated chemoradiation therapy, chemoradiation therapy side effects to include xerostomia, radiation caries, trismus, mucositis, taste changes, gum and jawbone changes, and risk for infection and osteoradionecrosis. We  discussed various treatment options to include no treatment, extraction of all  teeth in the primary field of radiation therapy, alveoloplasty, pre-prosthetic surgery as indicated, periodontal therapy, dental restorations, root canal therapy, crown and bridge therapy, implant therapy, and replacement of missing teeth as indicated. We also discussed fabrication of fluoride trays and scatter protection devices. We also discussed referral to an oral surgeon for second opinion concerning extraction of teeth in the primary field of radiation therapy.  The patient currently wishes to avoid dental extractions at this time. The patient did agree to impressions today for the fabrication of fluoride trays and scatter protection devices. The patient will call Dr. Barrie Dunker to schedule a dental cleaning appointment prior to the start of radiation therapy. The patient has been scheduled for insertion of fluoride trays and scatter protection devices  for this coming Wednesday, 04/23/2013 at 9 AM. A prescription for FluoroSHIELD was sent to [facility]   2. Discussion of findings with medical team and coordination of future medical and dental care as needed.  I spent 60 minutes face to face with patient and more than 50% of time was spent in counseling and /or coordination of care.   Lenn Cal, DDS

## 2013-04-21 NOTE — Telephone Encounter (Signed)
Attempted to call back, was able to speak with wife who took down appt date and time.  She is agreeable with appt at this time and will let patient know.

## 2013-04-21 NOTE — Patient Instructions (Signed)
RADIATION THERAPY AND DECISIONS REGARDING YOUR TEETH  Xerostomia (dry mouth) Your salivary glands may be in the filed of radiation.  Radiation may include all or part of your saliva glands.  This will cause your saliva to dry up and you will have a dry mouth.  The dry mouth will be for the rest of your life unless your radiation oncologist tells you otherwise.  Your saliva has many functions:  Saliva wets your tongue for speaking.  It coats your teeth and the inside of your mouth for easier movement.  It helps with chewing and swallowing food.  It helps clean away harmful acid and toxic products made by the germs in your mouth, therefore it helps prevent cavities.  It kills some germs in your mouth and helps to prevent gum disease.  It helps to carry flavor to your taste buds.  Once you have lost your saliva you will be at higher risk for tooth decay and gum disease.  What can be done to help improve your mouth when there's not enough saliva:  1.  Your dentist may give a prescription for Salagen.  It will not bring back all of your saliva but may bring back some of it.  Also your saliva may be thick and ropy or white and foamy. It will not feel like it use to feel.  2.  You will need to swish with water every time your mouth feels dry.  YOU CANNOT suck on any cough drops, mints, lemon drops, candy, vitamin C or any other products.  You cannot use anything other than water to make your mouth feel less dry.  If you want to drink anything else you have to drink it all at once and brush afterwards.  Be sure to discuss the details of your diet habits with your dentist or hygienist.  Radiation caries: This is decay that happens very quickly once your mouth is very dry due to radiation therapy.  Normally cavities take six months to two years to become a problem.  When you have dry mouth cavities may take as little as eight weeks to cause you a problem.  This is why dental check ups every two  months are necessary as long as you have a dry mouth. Radiation caries typically, but not always, start at your gum line where it is hard to see the cavity.  It is therefore also hard to fill these cavities adequately.  This high rate of cavities happens because your mouth no longer has saliva and therefore the acid made by the germs starts the decay process.  Whenever you eat anything the germs in your mouth change the food into acid.  The acid then burns a small hole in your tooth.  This small hole is the beginning of a cavity.  If this is not treated then it will grow bigger and become a cavity.  The way to avoid this hole getting bigger is to use fluoride every evening as prescribed by your dentist.  You have to make sure that your teeth are very clean before you use the fluoride.  This fluoride in turn will strengthen your teeth and prepare them for another day of fighting acid.  If you develop radiation caries many times the damage is so large that you will have to have all your teeth removed.  This could be a big problem if some of these teeth are in the field of radiation.  Further details of why this could be   a big problem will follow.  (See Osteoradionecrosis).  Loss of taste (dysgeusia) This happens to varying degrees once you've had radiation therapy to your jaw region.  Many times taste is not completely lost but becomes limited.  The loss of taste is mostly due to radiation affecting your taste buds.  However if you have no saliva in your mouth to carry the flavor to your taste buds it would be difficult for your taste buds to taste anything.  That is why using water or a prescription for Salagen prior to meals and during meals may help with some of the taste.  Keep in mind that taste generally returns very slowly over the course of several months or several years after radiation therapy.  Don't give up hope.  Trismus According to your Radiation Oncologist your TMJ or jaw joints are going to be  partially or fully in the field of radiation.  This means that over time the muscles that help you open and close your mouth may get stiff.  This will potentially result in your not being able to open your mouth wide enough or as wide as you can open it now.  Le me give you an example of how slowly this happens and how unaware people are of it.  A gentlemen that had radiation therapy two years ago came back to me complaining that bananas are just too large for him to be able to fit them in between his teeth.  He was not able to open wide enough to bite into a banana.  This happens slowly and over a period of time.  What do we do to try and prevent this?  Your dentist will probably give you a stack of sticks called a trismus exercise device .  This stack will help your remind your muscles and your jaw joint to open up to the same distance every day.  Use these sticks every morning when you wake up according to the instructions given by the dentist.   You must use these sticks for at least one to two years after radiation therapy.  The reason for that is because it happens so slowly and keeps going on for about two years after radiation therapy.  Your hospital dentist will help you monitor your mouth opening and make sure that it's not getting smaller.  Osteoradionecrosis (ORN) This is a condition where your jaw bone after having had radiation therapy becomes very dry.  It has very little blood supply to keep it alive.  If you develop a cavity that turns into an abscess or an infection then the jaw bone does not have enough blood supply to help fight the infection.  At this point it is very likely that the infection could cause the death of your jaw bone.  When you have dead bone it has to be removed.  Therefore you might end up having to have surgery to remove part of your jaw bone, the part of the jaw bone that has been affected.   Healing is also a problem if you are to have surgery in the areas where the bone  has had radiation therapy.  The same reasons apply.  If you have surgery you need more blood supply which is not available.  When blood supply and oxygen are not available again, there is a chance for the bone to die.  Occasionally ORN happens on its own with no obvious reason.  This is quite rare.  We believe that   patients who continue to smoke and/or drink alcohol have a higher chance of having this bone problem.  Therefore once your jaw bone has had radiation therapy if there are any teeth in that area, you should never have them pulled.  You should also never have any surgery on your teeth or gums in that area unless the oral surgeon or Periodontist is aware of your history of radiation. There is some expensive management techniques that might be used to limit your risks.  The risks for ORN either from infection or spontaneous ( or on it's own) are life long.      FLUORIDE TRAYS PATIENT INSTRUCTIONS    Obtain prescription from the pharmacy.  Don't be surprised if it needs to be ordered.  Be sure to let the pharmacy know when you are close to needing a new refill for them to have it ready for you without interruption of Fluoride use.  The best time to use your Fluoride is before bed time.  You must brush your teeth very well and floss before using the Fluoride in order to get the best use out of the Fluoride treatments.  Place 1 drop of Fluoride gel per tooth in the tray.  Place the tray on your lower teeth and your upper teeth.  Make sure the trays are seated all the way.  Remember, they only fit one way on your teeth.  Insert for 5 full minutes.  At the end of the 5 minutes, take the trays out.  SPIT OUT excess. .  Do NOT rinse your mouth!  Do NOT eat or drink after treatments for at least 30 minutes.  This is why the best time for your treatments is before bedtime.  Clean the inside of your Fluoride trays using COLD WATER and a toothbrush.  In order to keep your Trays from  discoloring and free from odors, soak them overnight in denture cleaners such as Efferdent.  Do not use bleach or non denture products.  Store the trays in a safe dry place AWAY from any heat until your next treatment.  If anything happens to your Fluoride trays, or they don't fit as well after any dental work, please let us know as soon as possible.  FLUORIDE TRAYS PATIENT INSTRUCTIONS    Obtain prescription from the pharmacy.  Don't be surprised if it needs to be ordered.  Be sure to let the pharmacy know when you are close to needing a new refill for them to have it ready for you without interruption of Fluoride use.  The best time to use your Fluoride is before bed time.  You must brush your teeth very well and floss before using the Fluoride in order to get the best use out of the Fluoride treatments.  Place 1 drop of Fluoride gel per tooth in the tray.  Place the tray on your lower teeth and your upper teeth.  Make sure the trays are seated all the way.  Remember, they only fit one way on your teeth.  Insert for 5 full minutes.  At the end of the 5 minutes, take the trays out.  SPIT OUT excess. .  Do NOT rinse your mouth!  Do NOT eat or drink after treatments for at least 30 minutes.  This is why the best time for your treatments is before bedtime.  Clean the inside of your Fluoride trays using COLD WATER and a toothbrush.  In order to keep your Trays from discoloring and free  from odors, soak them overnight in denture cleaners such as Efferdent.  Do not use bleach or non denture products.  Store the trays in a safe dry place AWAY from any heat until your next treatment.  If anything happens to your Fluoride trays, or they don't fit as well after any dental work, please let us know as soon as possible.

## 2013-04-21 NOTE — Telephone Encounter (Signed)
Called patient to inform of test, PT appt. And social work consult, spoke with patient and he aware of these appts.

## 2013-04-21 NOTE — Telephone Encounter (Signed)
Attempted to call patient to make him aware of appt date and time however there is no answer and no answering machine.

## 2013-04-22 ENCOUNTER — Other Ambulatory Visit: Payer: Self-pay | Admitting: Hematology and Oncology

## 2013-04-23 ENCOUNTER — Encounter (INDEPENDENT_AMBULATORY_CARE_PROVIDER_SITE_OTHER): Payer: Self-pay

## 2013-04-23 ENCOUNTER — Telehealth: Payer: Self-pay | Admitting: Hematology and Oncology

## 2013-04-23 ENCOUNTER — Telehealth: Payer: Self-pay | Admitting: *Deleted

## 2013-04-23 ENCOUNTER — Ambulatory Visit (HOSPITAL_COMMUNITY): Payer: Self-pay | Admitting: Dentistry

## 2013-04-23 ENCOUNTER — Encounter (HOSPITAL_COMMUNITY): Payer: Self-pay | Admitting: Dentistry

## 2013-04-23 ENCOUNTER — Ambulatory Visit: Payer: Medicare Other | Attending: Radiation Oncology | Admitting: Physical Therapy

## 2013-04-23 VITALS — BP 128/73 | HR 71 | Temp 98.6°F

## 2013-04-23 DIAGNOSIS — Z0189 Encounter for other specified special examinations: Secondary | ICD-10-CM

## 2013-04-23 DIAGNOSIS — C01 Malignant neoplasm of base of tongue: Secondary | ICD-10-CM | POA: Diagnosis not present

## 2013-04-23 DIAGNOSIS — Z463 Encounter for fitting and adjustment of dental prosthetic device: Secondary | ICD-10-CM

## 2013-04-23 DIAGNOSIS — R1310 Dysphagia, unspecified: Secondary | ICD-10-CM | POA: Insufficient documentation

## 2013-04-23 DIAGNOSIS — Z5189 Encounter for other specified aftercare: Secondary | ICD-10-CM | POA: Insufficient documentation

## 2013-04-23 DIAGNOSIS — R293 Abnormal posture: Secondary | ICD-10-CM | POA: Diagnosis not present

## 2013-04-23 NOTE — Progress Notes (Signed)
04/23/2013  Patient:            Brent Franklin Date of Birth:  May 14, 1931 MRN:                [id number]   BP 128/73  Pulse 71  Temp(Src) 98.6 F (23 C) (Oral)  Larey Dresser now presents for insertion of upper and lower fluoride trays and scatter protection devices.  PROCEDURE: Appliances were tried in and adjusted as needed. [place] Trismus device was fabricated 55 mm using 35 sticks.  Postop instructions were provided and a written and verbal format concerning the use and care of appliances. All questions were answered. Patient to call for appointment if he desires periodic oral examination during radiation therapy, otherwise patient will be seen approximately one month after the last radiation dose has been provided. Patient to call if questions or problems arise before then.  Lenn Cal, DDS

## 2013-04-23 NOTE — Patient Instructions (Signed)

## 2013-04-23 NOTE — Telephone Encounter (Signed)
, °

## 2013-04-23 NOTE — Telephone Encounter (Signed)
Per staff message and POF I have scheduled appts.  JMW  

## 2013-04-24 ENCOUNTER — Encounter (INDEPENDENT_AMBULATORY_CARE_PROVIDER_SITE_OTHER): Payer: Self-pay | Admitting: General Surgery

## 2013-04-24 ENCOUNTER — Encounter (HOSPITAL_COMMUNITY): Payer: Self-pay | Admitting: Pharmacy Technician

## 2013-04-24 ENCOUNTER — Ambulatory Visit (INDEPENDENT_AMBULATORY_CARE_PROVIDER_SITE_OTHER): Payer: Medicare Other | Admitting: General Surgery

## 2013-04-24 VITALS — BP 108/76 | HR 66 | Resp 12 | Ht 74.0 in | Wt 174.4 lb

## 2013-04-24 DIAGNOSIS — C029 Malignant neoplasm of tongue, unspecified: Secondary | ICD-10-CM

## 2013-04-24 DIAGNOSIS — C01 Malignant neoplasm of base of tongue: Secondary | ICD-10-CM

## 2013-04-24 NOTE — Progress Notes (Addendum)
Patient ID: Brent Franklin, male   DOB: [birth year], 80 y.o.   MRN: [id number]  No chief complaint on file.   HPI Brent Franklin is a 80 y.o. male.  He is referred by Dr. Simeon Craft such for consideration of Port-A-Cath placement and a gastrostomy feeding tube placement. Dr. Dedra Skeens is his PCP. Dr. Wilburn Cornelia is his otolaryngologist.  Had recently had swallowing difficulty, palpable cervical adenopathy, discomfort, slight voice change. 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 15 lb weight loss. They are planning chemotherapy and radiation therapy. The course and has requested a feeding gastrostomy tube to help him through the swelling and salivary problems with the treatment and the Port-A-Cath to facilitate chemotherapy. This is scheduled for March 23.  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 Inguinal hernia repair and did well with that 14 months ago. HPI  Past Medical History  Diagnosis Date  . Diabetes mellitus without complication   . Hyperlipidemia   . Inguinal hernia   . 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 Hypertension   . Cataract   . Tongue cancer 04/18/2013    Base of tongue    Past Surgical History  Procedure Laterality Date  . Eye surgery  2000, 2001    cataract  . Inguinal hernia repair  02/13/2012    Procedure: HERNIA REPAIR INGUINAL ADULT;  Surgeon: Adin Hector, MD;  Location: [facility];  Service: General;  Laterality: Right;  . Insertion of mesh  02/13/2012    Procedure: INSERTION OF MESH;  Surgeon: Adin Hector, MD;  Location: [facility];  Service: General;  Laterality: N/A;    Family History  Problem Relation Age of Onset  . Cancer Mother 34    breast ca  . Stroke Father   . Cancer Brother 43    prostate ca  . Heart disease Brother     Social History History  Substance Use Topics  . Smoking status: Never Smoker   . Smokeless tobacco:  Never Used  . Alcohol Use: Yes     Comment: glass of wine - 4 days a week    No Known Allergies  Current Outpatient Prescriptions  Medication Sig Dispense Refill  . aspirin 81 MG tablet Take 81 mg by mouth daily.      Marland Kitchen atorvastatin (LIPITOR) 10 MG tablet Take 10 mg by mouth daily.      Marland Kitchen glipiZIDE (GLUCOTROL XL) 5 MG 24 hr tablet Take 5 mg by mouth daily.      Marland Kitchen ibuprofen (ADVIL,MOTRIN) 200 MG tablet Take 200 mg by mouth every 6 (six) hours as needed.      . metFORMIN (GLUCOPHAGE) 850 MG tablet Take 850 mg by mouth 2 (two) times daily with a meal.      . Multiple Vitamins-Minerals (PRESERVISION AREDS 2) CAPS Take 1 capsule by mouth 2 (two) times daily.      . sodium fluoride (FLUORISHIELD) 1.1 % GEL dental gel Instill one drop of fluoride per toothspace of fluoride tray. Place over teeth for 5 minutes. Remove. Spit out excess. Repeat nightly.  120 mL  prn  . valsartan-hydrochlorothiazide (DIOVAN-HCT) 320-12.5 MG per tablet        No current facility-administered medications for this visit.    Review of Systems Review of Systems  Constitutional: Positive for unexpected weight change. Negative for fever and chills.  HENT: Positive for hearing loss, trouble swallowing and voice  change. Negative for congestion and sore throat.   Eyes: Negative for visual disturbance.  Respiratory: Negative for cough and wheezing.   Cardiovascular: Negative for chest pain, palpitations and leg swelling.  Gastrointestinal: Negative for nausea, vomiting, abdominal pain, diarrhea, constipation, blood in stool, abdominal distention, anal bleeding and rectal pain.  Genitourinary: Negative for hematuria and difficulty urinating.  Musculoskeletal: Negative for arthralgias.  Skin: Negative for rash and wound.  Neurological: Negative for seizures, syncope, weakness and headaches.  Hematological: Negative for adenopathy. Does not bruise/bleed easily.  Psychiatric/Behavioral: Negative for confusion.    Blood  pressure 108/76, pulse 66, resp. rate 12, height 6\' 2"  (1.88 m), weight 174 lb 6.4 oz (79.107 kg).  Physical Exam Physical Exam  Constitutional: He is oriented to person, place, and time. He appears well-developed and well-nourished. No distress.  HENT:  Head: Normocephalic.  Nose: Nose normal.  Mouth/Throat: No oropharyngeal exudate.  Eyes: Conjunctivae and EOM are normal. Pupils are equal, round, and reactive to light. Right eye exhibits no discharge. Left eye exhibits no discharge. No scleral icterus.  Neck: Normal range of motion. Neck supple. No JVD present. No tracheal deviation present. No thyromegaly present.  Cardiovascular: Normal rate, regular rhythm, normal heart sounds and intact distal pulses.   No murmur heard. Pulmonary/Chest: Effort normal and breath sounds normal. No stridor. No respiratory distress. He has no wheezes. He has no rales. He exhibits no tenderness.  Clavicles and suprasternal notch without deformity.  Abdominal: Soft. Bowel sounds are normal. He exhibits no distension and no mass. There is no tenderness. There is no rebound and no guarding.  Musculoskeletal: Normal range of motion. He exhibits no edema and no tenderness.  Lymphadenopathy:    He has cervical adenopathy.  Neurological: He is alert and oriented to person, place, and time. He has normal reflexes. Coordination normal.  Skin: Skin is warm and dry. No rash noted. He is not diaphoretic. No erythema. No pallor.  Psychiatric: He has a normal mood and affect. His behavior is normal. Judgment and thought content normal.    Data Reviewed Office notes from [facility]. Imaging studies.  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     Plan    Scheduled for Port-A-Cath insertion with C-arm and  ultrasound, laparoscopic gastrostomy tube placement, possible open  I discussed the indications, details, techniques, and numerous risk of the surgery with him. He is aware of the risk of bleeding, infection, pneumothorax, malfunction of either catheter requiring revision, intra-abdominal leakage, injury to adjacent organs. He understands all these issues well, given his medical background. All his questions were answered. He is in full agreement with this plan.  We talked about care of the Port-A-Cath and care of the feeding tube and he seems to understand all this quite well.       Edsel Petrin. Dalbert Batman, M.D., [facility] General and Minimally invasive Surgery Breast and Colorectal Surgery Office:   [phone] Pager:   [phone]  04/24/2013, 1:06 PM

## 2013-04-24 NOTE — Patient Instructions (Addendum)
We have gone over the details of Port-A-Cath insertion and gastrostomy tube placement.  You are scheduled for this surgery on March 23.            Gastrostomy Tube Home Guide A gastrostomy tube is a tube that is surgically placed into the stomach. It is also called a "G-tube." G-tubes are used when a person is unable to eat and drink enough on their own to stay healthy. The tube is inserted into the stomach through a small cut (incision) in the skin. This tube is used for:  Feeding.  Giving medication. GASTROSTOMY TUBE CARE  Wash your hands with soap and water.  Remove the old dressing (if any). Some styles of G-tubes may need a dressing inserted between the skin and the G-tube. Other types of G-tubes do not require a dressing. Ask your health care provider if a dressing is needed.  Check the area where the tube enters the skin (insertion site) for redness, swelling, or pus-like (purulent) drainage. A small amount of clear or tan liquid drainage is normal. Check to make sure scar tissue (skin) is not growing around the insertion site. This could have a raised, bumpy appearance.  A cotton swab can be used to clean the skin around the tube:  When the G-tube is first put in, a normal saline solution or water can be used to clean the skin.  Mild soap and warm water can be used when the skin around the G-tube site has healed.  Roll the cotton swab around the G-tube insertion site to remove any drainage or crusting at the insertion site. STOMACH RESIDUALS Feeding tube residuals are the amount of liquids that are in the stomach at any given time. Residuals may be checked before giving feedings, medications, or as instructed by your health care provider.  Ask your health care provider if there are instances when you would not start tube feedings depending on the amount or type of contents withdrawn from the stomach.  Check residuals by attaching a syringe to the G-tube and pull back  on the syringe plunger. Note the amount and return the residual back into the stomach. FLUSHING THE G-TUBE  The G-tube should be periodically flushed with clean warm water to keep it from clogging.  Flush the G-tube after feedings or medications. Draw up 30 mL of warm water in a syringe. Connect the syringe to the G-tube and slowly push the water into the tube.  Do not push feedings, medications, or flushes rapidly. Flush the G-tube gently and slowly.  Only use syringes made for G-tubes to flush medications or feedings.  Your health care provider may want the G-tube flushed more often or with more water. If this is the case, follow your health care provider's instructions. FEEDINGS Your health care provider will determine whether feedings are given as a bolus (a certain amount given at one time and at scheduled times) or whether feedings will be given continuously on a feeding pump.   Formulas should be given at room temperature.  If feedings are continuous, no more than 4 hours worth of feedings should be placed in the feeding bag. This helps prevent spoilage or accidental excess infusion.  Cover and place unused formula in the refrigerator.  If feedings are continuous, stop the feedings when medications or flushes are given. Be sure to restart the feedings.  Feeding bags and syringes should be replaced as instructed by your health care provider. GIVING MEDICATION   In general, it is  best if all medications are in a liquid form for G-tube administration. Liquid medications are less likely to clog the G-tube.  Mix the liquid medication with 30 mL (or amount recommended by your health care provider) of warm water.  Draw up the medication into the syringe.  Attach the syringe to the G-tube and slowly push the mixture into the G-tube.  After giving the medication, draw up 30 mL of warm water in the syringe and slowly flush the G-tube.  For pills or capsules, check with your health  care provider first before crushing medications. Some pills are not effective if they are crushed. Some capsules are sustained release medications.  If appropriate, crush the pill or capsule and mix with 30 mL of warm water. Using the syringe, slowly push the medication through the tube, then flush the tube with another 30 mL of tap water. G-TUBE PROBLEMS G-tube was pulled out.  Cause: May have been pulled out accidentally.  Solutions: Cover the opening with clean dressing and tape. Call your health care provider right away. The G-tube should be put in as soon as possible (within 4 hours) so the G-tube opening (tract) does not close. The G-tube needs to be put in at a healthcare setting. An X-ray needs to be done to confirm placement before the G-tube can be used again. Redness, irritation, soreness, or foul odor around the gastrostomy site.  Cause: May be caused by leakage or infection.  Solutions: Call your health care provider right away. Large amount of leakage of fluid or mucus-like liquid present (a large amount means it soaks clothing).  Cause: Many reasons could cause the G-tube to leak.  Solutions: Call your health care provider to discuss the amount of leakage. Skin or scar tissue appears to be growing where tube enters skin.   Cause: Tissue growth may develop around the insertion site if the G-tube is moved or pulled on excessively.  Solutions: Secure tube with tape so that excess movement does not occur. Call your health care provider. G-tube is clogged.  Cause: Thick formula or medication.  Solutions: Try to slowly push warm water into the tube with a large syringe. Never try to push any object into the tube to unclog it. Do not force fluid into the G-tube. If you are unable to unclog the tube, call your health care provider right away. TIPS  Head of Bed [facility]) position refers to the upright position of a person's upper body.  When giving medications or a feeding bolus,  keep the [facility] up as told by your health care provider. Do this during the feeding and for 1 hour after the feeding or medication administration.  If continuous feedings are being given, it is best to keep the [facility] up as told by your health care provider. When ADLs (Activities of Daily Living) are performed and the [facility] needs to be flat, be sure to turn the feeding pump off. Restart the feeding pump when the [facility] is returned to the recommended height.  Do not pull or put tension on the tube.  To prevent fluid backflow, kink the G-tube before removing the cap or disconnecting a syringe.  Check the G-tube length every day. Measure from the insertion site to the end of the G-tube. If the length is longer than previous measurements, the tube may be coming out. Call your health care provider if you notice increasing G-tube length.  Oral care, such as brushing teeth, must be continued.  You may need to  remove excess air (vent) from the G-tube. Your health care provider will tell you if this is needed.  Always call your health care provider if you have questions or problems with the G-tube. SEEK IMMEDIATE MEDICAL CARE IF:   You have severe abdominal pain, tenderness, or abdominal bloating (distension).  You have nausea or vomiting.  You are constipated or have problems moving your bowels.  The G-tube insertion site is red, swollen, has a foul smell, or has yellow or brown drainage.  You have difficulty breathing or shortness of breath.  You have a fever.  You have a large amount of feeding tube residuals.  The G-tube is clogged and cannot be flushed. MAKE SURE YOU:   Understand these instructions.  Will watch your condition.  Will get help right away if you are not doing well or get worse. Document Released: 04/10/2001 Document Revised: 11/20/2012 Document Reviewed: 10/07/2012 [facility] Patient Information 2014 [place]. Implanted Port Insertion An implanted port is a central line  that has a round shape and is placed under the skin. It is used as a long-term IV access for:   Medicines, such as chemotherapy.   Fluids.   Liquid nutrition, such as total parenteral nutrition (TPN).   Blood samples.  LET [facility] CARE PROVIDER KNOW ABOUT:  Allergies to food or medicine.   Medicines taken, including vitamins, herbs, eye drops, creams, and over-the-counter medicines.   Any allergies to heparin.  Use of steroids (by mouth or creams).   Previous problems with anesthetics or numbing medicines.   History of bleeding problems or blood clots.   Previous surgery.   Other health problems, including diabetes and kidney problems.   Possibility of pregnancy, if this applies. RISKS AND COMPLICATIONS  Damage to the blood vessel, bruising, or bleeding at the puncture site.   Infection.  Blood clot in the vessel that the port is in.  Breakdown of the skin over your port.  Very rarely a person may develop a condition called a pneumothorax, a collection of air in the chest that may cause one of the lungs to collapse. The placement of these catheters with the appropriate imaging guidance significantly decreases the risk of a pneumothorax.  BEFORE THE PROCEDURE   Your health care provider may want you to have blood tests. These tests can help tell how well your kidneys and liver are working. They can also show how well your blood clots.   If you take blood thinners (anticoagulant medicines), ask your health care provider when you should stop taking them.   Make arrangements for someone to drive you home. This is necessary if you have been sedated for your procedure.  PROCEDURE  Port insertion usually takes about 30 45 minutes.   An IV needle will be inserted in your arm. Medicine for pain and medicine to help relax you (sedative) will flow directly into your body through this needle.   You will lie on an exam table, and you will be connected to  monitors to keep track of your heart rate, blood pressure, and breathing throughout the procedure.  An oxygen monitoring device may be attached to your finger. Oxygen will be given.   Everything is kept as germ free (sterile) as possible during the procedure. The skin near the point of the incision will be cleaned with antiseptic, and the area will be draped with sterile towels. The skin and deeper tissues over the port area will be made numb with a local anesthetic.  Two small cuts (incisions) will be made in the skin to insert the port. One will be made in the neck to obtain access to the vein where the catheter will lie.   Because the port reservoir is placed under the skin, a small skin incision is made in the upper chest, and a small pocket for the port is made under the skin. The catheter connected to the port tunnels to a large central vein in the chest. A small, raised area remains on your body at the site of the reservoir when the procedure is complete.  The port placement is done under imaging guidance to ensure the proper placement.  The reservoir has a silicone covering that can be punctured with a special needle.   The port is flushed with normal saline, and blood is drawn to make sure it is working properly.  There is nothing remaining outside the skin when the procedure is finished.   Incisions are held together by stitches, surgical glue, or a special tape.  AFTER THE PROCEDURE  You will stay in a recovery area until the anesthesia has worn off. Your blood pressure and pulse will be checked.  A final chest X-ray is taken to check the placement of the port and that there is no injury to your lung.  If there are no problems, you should be able to go home after the procedure.  Document Released: 11/20/2012 Document Reviewed: 10/07/2012 [facility] Patient Information 2014 [place], [place].

## 2013-04-28 ENCOUNTER — Ambulatory Visit
Admission: RE | Admit: 2013-04-28 | Discharge: 2013-04-28 | Disposition: A | Discharge status: Home / Self Care | Payer: Medicare Other | Source: Ambulatory Visit | Attending: Radiation Oncology | Admitting: Radiation Oncology

## 2013-04-28 ENCOUNTER — Other Ambulatory Visit: Payer: Medicare Other

## 2013-04-28 ENCOUNTER — Ambulatory Visit: Payer: Medicare Other

## 2013-04-28 ENCOUNTER — Encounter: Payer: Self-pay | Admitting: *Deleted

## 2013-04-28 ENCOUNTER — Other Ambulatory Visit: Payer: Self-pay | Admitting: Radiation Oncology

## 2013-04-28 ENCOUNTER — Other Ambulatory Visit: Payer: Self-pay | Admitting: *Deleted

## 2013-04-28 ENCOUNTER — Telehealth: Payer: Self-pay | Admitting: *Deleted

## 2013-04-28 ENCOUNTER — Ambulatory Visit: Payer: Medicare Other | Admitting: Nutrition

## 2013-04-28 DIAGNOSIS — C01 Malignant neoplasm of base of tongue: Secondary | ICD-10-CM

## 2013-04-28 DIAGNOSIS — R599 Enlarged lymph nodes, unspecified: Secondary | ICD-10-CM | POA: Diagnosis not present

## 2013-04-28 DIAGNOSIS — B370 Candidal stomatitis: Secondary | ICD-10-CM | POA: Diagnosis not present

## 2013-04-28 DIAGNOSIS — Z5189 Encounter for other specified aftercare: Secondary | ICD-10-CM | POA: Diagnosis not present

## 2013-04-28 DIAGNOSIS — Y842 Radiological procedure and radiotherapy as the cause of abnormal reaction of the patient, or of later complication, without mention of misadventure at the time of the procedure: Secondary | ICD-10-CM | POA: Insufficient documentation

## 2013-04-28 DIAGNOSIS — R21 Rash and other nonspecific skin eruption: Secondary | ICD-10-CM | POA: Insufficient documentation

## 2013-04-28 DIAGNOSIS — Z931 Gastrostomy status: Secondary | ICD-10-CM | POA: Diagnosis not present

## 2013-04-28 DIAGNOSIS — R070 Pain in throat: Secondary | ICD-10-CM | POA: Diagnosis not present

## 2013-04-28 DIAGNOSIS — K1370 Unspecified lesions of oral mucosa: Secondary | ICD-10-CM | POA: Insufficient documentation

## 2013-04-28 DIAGNOSIS — B977 Papillomavirus as the cause of diseases classified elsewhere: Secondary | ICD-10-CM | POA: Diagnosis not present

## 2013-04-28 DIAGNOSIS — R1310 Dysphagia, unspecified: Secondary | ICD-10-CM | POA: Insufficient documentation

## 2013-04-28 DIAGNOSIS — L539 Erythematous condition, unspecified: Secondary | ICD-10-CM | POA: Diagnosis not present

## 2013-04-28 DIAGNOSIS — Z510 Encounter for antineoplastic radiation therapy: Secondary | ICD-10-CM | POA: Diagnosis not present

## 2013-04-28 DIAGNOSIS — R799 Abnormal finding of blood chemistry, unspecified: Secondary | ICD-10-CM | POA: Insufficient documentation

## 2013-04-28 DIAGNOSIS — R293 Abnormal posture: Secondary | ICD-10-CM | POA: Diagnosis not present

## 2013-04-28 LAB — BUN AND CREATININE (CC13)
BUN: 27.4 mg/dL — ABNORMAL HIGH (ref 7.0–26.0)
CREATININE: 1.2 mg/dL (ref 0.7–1.3)

## 2013-04-28 MED ORDER — SODIUM CHLORIDE 0.9 % IJ SOLN
10.0000 mL | Freq: Once | INTRAMUSCULAR | Status: AC
  Administered 2013-04-28: 10 mL via INTRAVENOUS

## 2013-04-28 NOTE — Progress Notes (Signed)
Patient tolerated ct simulation well, a little anxiety with the mask"but it is an open mask,didn't feel the IV contrast at all", d/c IV catheter right forearm, catheter intact, no brakage,  placed 2x2 gause over suite and papet tape secured,held pressure for 1 minute, gave instructions to keep dressing on for a few hours, and when removing if bleeding recurs to place gause back over and hold pressure, pt gave verbal understanding,  d/c patient home, ambulatory,steady gait,  1:54 PM

## 2013-04-28 NOTE — Progress Notes (Signed)
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  He is a patient of Dr. Alvy Bimler.  Past medical history includes diabetes, hyperlipidemia, stroke, and hypertension.  Medications include Lipitor, Glucotrol, Glucophage, and multivitamin.  Labs were reviewed.  Height: 6 feet 2 inches. Weight: 174.8 pounds March 16. Usual body weight: 184 pounds January 2014. BMI: 22.43.  Patient presents with good information regarding nutrition needs during treatment.  He has questions regarding feeding tube placement and the types of formula that will be required.  Nutrition diagnosis: Predicted suboptimal energy intake related to new diagnosis of tongue cancer and associated treatments as evidenced by history or presence of the condition for which research shows an increased incidence of suboptimal energy intake.  Intervention: Patient was educated on potential side effects and strategies for consuming adequate calories and protein throughout treatment.  Nutrition fact sheets were provided on side effect management.  Discussed the importance of continuing oral intake as long as possible throughout treatment and gradually supplementing nutrition and hydration through feeding tube.  Questions were answered.  Teach back method used.  Oral nutrition supplement samples were provided.  Monitoring, evaluation, goals:.  Patient will tolerate tube feedings and oral intake to minimize weight loss and nutritional side effects throughout treatment.  Next visit: Wednesday, April 1, during chemotherapy.

## 2013-04-28 NOTE — Progress Notes (Signed)
Simulation, IMRT treatment planning, and Special treatment procedure note   Outpatient  Diagnosis: head and neck cancer  The patient was taken to the CT simulator and laid in the supine position on the table. An Aquaplast head and shoulder mask was custom fitted to the patient's anatomy. High-resolution CT axial imaging was obtained of the head and neck with contrast. I verified that the quality of the imaging is good for treatment planning. 1 Medically Necessary Treatment Device was fabricated and supervised by me: Aquaplast mask.  Patient was advised to drink plenty of fluid today in light of slightly elevated BUN.  Treatment planning note I plan to treat the patient with helical Tomotherapy, IMRT. I plan to treat the patient's tumor and bilateral neck nodes. I plan to treat to a total dose of 70 Gray in 35  fractions   IMRT planning Note  IMRT is an important modality to deliver adequate dose to the patient's at risk tissues while sparing the patient's normal structures, including the: esophagus, parotid tissue, mandible, brain stem, spinal cord, oral cavity, brachial plexus.  This justifies the use of IMRT in the patient's treatment.   Special Treatment Procedure Note:  The patient will be receiving chemotherapy concurrently. Chemotherapy heightens the risk of side effects. I have considered this during the patient's treatment planning process and will monitor the patient accordingly for side effects on a weekly basis. Concurrent chemotherapy increases the complexity of this patient's treatment and therefore this constitutes a special treatment procedure.  -----------------------------------  Eppie Gibson, MD

## 2013-04-28 NOTE — Progress Notes (Signed)
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  He was informed of this and also informed that he needs to return on Wed. At 1400 to have blood drawn for a BUN/Creatinine check prior to resuming Metformin.  He stated understanding and agreed to a 2 pm Lab appt, on Wed. of this week.  Current BUN is 2.74 today and Dr. [name] aware.  Dr. Isidore Moos stated that it is okay to proceed with the contrast for the above stated BUN value.  Creatinine 1.2.

## 2013-04-28 NOTE — Progress Notes (Signed)
Spoke with patient prior to nutritional consult and SIM.  He expressed confusion re: appts for the day as he referred to and earlier AVS that did not have new appts.  I encouraged him to check My Chart in the future particularly during the early weeks of pre-treatment appts.  He verbalized understanding.   Gayleen Orem, RN, BSN, [facility] Head & Neck Oncology Navigator [phone]

## 2013-04-28 NOTE — Telephone Encounter (Signed)
Called patient home,spoke with the wife, asking if Dr.Boulter was on his way his appt with [place] was fro 730 am and ct sim,ulation at [facility], he also needed labs before IV start, "wife stated,no, he's here getting ready for his 9am appt, he doesn't have on his calendar appt with rad/onc", she wa apologetic, I let her know I or someone would call her back after I spoke with Dr.Squire and see what we could do, then went and infromed Dr,.Squire of status 8:20 AM

## 2013-04-29 ENCOUNTER — Encounter (INDEPENDENT_AMBULATORY_CARE_PROVIDER_SITE_OTHER): Payer: Medicare Other | Admitting: General Surgery

## 2013-04-29 ENCOUNTER — Encounter (HOSPITAL_COMMUNITY)
Admission: RE | Admit: 2013-04-29 | Discharge: 2013-04-29 | Disposition: A | Discharge status: Home / Self Care | Payer: Medicare Other | Source: Ambulatory Visit | Attending: General Surgery | Admitting: General Surgery

## 2013-04-29 ENCOUNTER — Ambulatory Visit (HOSPITAL_COMMUNITY)
Admission: RE | Admit: 2013-04-29 | Discharge: 2013-04-29 | Disposition: A | Discharge status: Home / Self Care | Payer: Medicare Other | Source: Ambulatory Visit | Attending: Radiation Oncology | Admitting: Radiation Oncology

## 2013-04-29 ENCOUNTER — Encounter (HOSPITAL_COMMUNITY): Payer: Self-pay

## 2013-04-29 ENCOUNTER — Ambulatory Visit (HOSPITAL_COMMUNITY): Payer: Medicare Other

## 2013-04-29 DIAGNOSIS — Z01818 Encounter for other preprocedural examination: Secondary | ICD-10-CM | POA: Diagnosis not present

## 2013-04-29 DIAGNOSIS — C01 Malignant neoplasm of base of tongue: Secondary | ICD-10-CM | POA: Insufficient documentation

## 2013-04-29 DIAGNOSIS — C029 Malignant neoplasm of tongue, unspecified: Secondary | ICD-10-CM | POA: Diagnosis not present

## 2013-04-29 DIAGNOSIS — C770 Secondary and unspecified malignant neoplasm of lymph nodes of head, face and neck: Secondary | ICD-10-CM | POA: Diagnosis not present

## 2013-04-29 DIAGNOSIS — C779 Secondary and unspecified malignant neoplasm of lymph node, unspecified: Secondary | ICD-10-CM | POA: Diagnosis not present

## 2013-04-29 HISTORY — DX: Respiratory tuberculosis unspecified: A15.9

## 2013-04-29 LAB — COMPREHENSIVE METABOLIC PANEL
ALK PHOS: 103 U/L (ref 39–117)
ALT: 28 U/L (ref 0–53)
AST: 25 U/L (ref 0–37)
Albumin: 3.8 g/dL (ref 3.5–5.2)
BILIRUBIN TOTAL: 0.6 mg/dL (ref 0.3–1.2)
BUN: 21 mg/dL (ref 6–23)
CALCIUM: 9.4 mg/dL (ref 8.4–10.5)
CHLORIDE: 103 meq/L (ref 96–112)
CO2: 29 meq/L (ref 19–32)
Creatinine, Ser: 0.98 mg/dL (ref 0.50–1.35)
GFR, EST AFRICAN AMERICAN: 88 mL/min — AB (ref >90)
GFR, EST NON AFRICAN AMERICAN: 76 mL/min — AB (ref >90)
GLUCOSE: 106 mg/dL — AB (ref 70–99)
POTASSIUM: 4.3 meq/L (ref 3.7–5.3)
Sodium: 143 mEq/L (ref 137–147)
Total Protein: 6.7 g/dL (ref 6.0–8.3)

## 2013-04-29 LAB — CBC WITH DIFFERENTIAL/PLATELET
Basophils Absolute: 0 10*3/uL (ref 0.0–0.1)
Basophils Relative: 0 % (ref 0–1)
EOS PCT: 4 % (ref 0–5)
Eosinophils Absolute: 0.3 10*3/uL (ref 0.0–0.7)
HCT: 40.7 % (ref 39.0–52.0)
Hemoglobin: 13.7 g/dL (ref 13.0–17.0)
LYMPHS ABS: 1 10*3/uL (ref 0.7–4.0)
Lymphocytes Relative: 16 % (ref 12–46)
MCH: 29 pg (ref 26.0–34.0)
MCHC: 33.7 g/dL (ref 30.0–36.0)
MCV: 86.2 fL (ref 78.0–100.0)
Monocytes Absolute: 0.6 10*3/uL (ref 0.1–1.0)
Monocytes Relative: 10 % (ref 3–12)
NEUTROS ABS: 4.5 10*3/uL (ref 1.7–7.7)
Neutrophils Relative %: 70 % (ref 43–77)
PLATELETS: 183 10*3/uL (ref 150–400)
RBC: 4.72 MIL/uL (ref 4.22–5.81)
RDW: 14.2 % (ref 11.5–15.5)
WBC: 6.4 10*3/uL (ref 4.0–10.5)

## 2013-04-29 LAB — GLUCOSE, CAPILLARY: Glucose-Capillary: 96 mg/dL (ref 70–99)

## 2013-04-29 MED ORDER — FLUDEOXYGLUCOSE F - 18 (FDG) INJECTION
8.3000 | Freq: Once | INTRAVENOUS | Status: AC | PRN
  Administered 2013-04-29: 8.3 via INTRAVENOUS

## 2013-04-29 NOTE — Progress Notes (Signed)
PATIENT WAS REFUSING CXR AND EKG STATING HE HAD NO CARDIAC HISTORY AND HAS HAD DONE NOT LONG AGO.  PER DR. SINGER WE DID NOT NEED TO DO PREOP. (CXR 12/23 AND EKG 02/16/12 OKAY FOR SURGERY)  PER ORDER OF DR. SINGER.

## 2013-04-29 NOTE — Pre-Procedure Instructions (Signed)
Leith Szafranski  04/29/2013   Your procedure is scheduled on:   Monday  05/05/13  Report to [facility]  2 * 3 at 1000 AM.  Call this number if you have problems the morning of surgery: [phone]   Remember:   Do not eat food or drink liquids after midnight.   Take these medicines the morning of surgery with A SIP OF WATER:  NONE (STOP ASPIRIN, IBUPROFEN)  NO COUMADIN, PLAVIX, EFFIENT, HERBAL MEDICINES/ VITAMINS   Do not wear jewelry, make-up or nail polish.  Do not wear lotions, powders, or perfumes. You may wear deodorant.  Do not shave 48 hours prior to surgery. Men may shave face and neck.  Do not bring valuables to the hospital.  [facility] is not responsible                  for any belongings or valuables.               Contacts, dentures or bridgework may not be worn into surgery.  Leave suitcase in the car. After surgery it may be brought to your room.  For patients admitted to the hospital, discharge time is determined by your                treatment team.               Patients discharged the day of surgery will not be allowed to drive  home.  Name and phone number of your driver:   Special Instructions:  Special Instructions: [place] - Preparing for Surgery  Before surgery, you can play an important role.  Because skin is not sterile, your skin needs to be as free of germs as possible.  You can reduce the number of germs on you skin by washing with CHG (chlorahexidine gluconate) soap before surgery.  CHG is an antiseptic cleaner which kills germs and bonds with the skin to continue killing germs even after washing.  Please DO NOT use if you have an allergy to CHG or antibacterial soaps.  If your skin becomes reddened/irritated stop using the CHG and inform your nurse when you arrive at Short Stay.  Do not shave (including legs and underarms) for at least 48 hours prior to the first CHG shower.  You may shave your face.  Please follow these  instructions carefully:   1.  Shower with CHG Soap the night before surgery and the morning of Surgery.  2.  If you choose to wash your hair, wash your hair first as usual with your normal shampoo.  3.  After you shampoo, rinse your hair and body thoroughly to remove the Shampoo.  4.  Use CHG as you would any other liquid soap. You can apply chg directly to the skin and wash gently with scrungie or a clean washcloth.  5.  Apply the CHG Soap to your body ONLY FROM THE NECK DOWN.  Do not use on open wounds or open sores.  Avoid contact with your eyes, ears, mouth and genitals (private parts).  Wash genitals (private parts with your normal soap.  6.  Wash thoroughly, paying special attention to the area where your surgery will be performed.  7.  Thoroughly rinse your body with warm water from the neck down.  8.  DO NOT shower/wash with your normal soap after using and rinsing off the CHG Soap.  9.  Pat yourself dry with a clean towel.  10.  Wear clean pajamas.            11.  Place clean sheets on your bed the night of your first shower and do not sleep with pets.  Day of Surgery  Do not apply any lotions/deodorants the morning of surgery.  Please wear clean clothes to the hospital/surgery center.   Please read over the following fact sheets that you were given: Pain Booklet, Coughing and Deep Breathing and Surgical Site Infection Prevention

## 2013-04-30 ENCOUNTER — Encounter: Payer: Self-pay | Admitting: *Deleted

## 2013-04-30 ENCOUNTER — Other Ambulatory Visit: Payer: Self-pay | Admitting: *Deleted

## 2013-04-30 ENCOUNTER — Ambulatory Visit
Admission: RE | Admit: 2013-04-30 | Discharge: 2013-04-30 | Disposition: A | Discharge status: Home / Self Care | Payer: Medicare Other | Source: Ambulatory Visit | Attending: Radiation Oncology | Admitting: Radiation Oncology

## 2013-04-30 DIAGNOSIS — C01 Malignant neoplasm of base of tongue: Secondary | ICD-10-CM

## 2013-04-30 LAB — BUN AND CREATININE (CC13)
BUN: 21.2 mg/dL (ref 7.0–26.0)
Creatinine: 1 mg/dL (ref 0.7–1.3)

## 2013-04-30 NOTE — Progress Notes (Signed)
Spoke with patient s/p today's lab work and informed him per Dr. Pearlie Oyster guidance that PET results do not indicate metastatic disease.  He expressed appreciation for information.  Continuing navigation as L1 patient (new patient).  Gayleen Orem, RN, BSN, [facility] Head & Neck Oncology Navigator [phone]

## 2013-04-30 NOTE — Addendum Note (Signed)
Encounter addended by: Bernardina Cacho Mintz Kathaleya Mcduffee, RN on: 04/30/2013 10:39 AM<BR>     Documentation filed: Charges VN

## 2013-05-01 ENCOUNTER — Ambulatory Visit (HOSPITAL_COMMUNITY): Payer: Medicare Other

## 2013-05-01 NOTE — H&P (Signed)
Brent Franklin    MRN:  [id number]   Description: 80 year old male  Provider: Adin Hector, MD  Department: [facility]            Diagnoses      Malignant neoplasm of base of tongue    -  Primary      141.0      Tongue cancer          141.9            Current Vitals - Last Recorded      BP Pulse Resp Ht Wt BMI      108/76 66 12 6\' 2"  (1.88 m) 174 lb 6.4 oz (79.107 kg) 22.38 kg/m2           History and Physical     Adin Hector, MD     Status: Addendum            Patient ID: Brent Franklin, male   DOB: 1931/08/18, 80 y.o.   MRN: [id number]          HPI Brent Franklin is a 80 y.o. male.  He is referred by Dr. Simeon Craft such for consideration of Port-A-Cath placement and a gastrostomy feeding tube placement. Dr. Dedra Skeens is his PCP. Dr. Wilburn Cornelia is his otolaryngologist.   Had recently had swallowing difficulty, palpable cervical adenopathy, discomfort, slight voice change. 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 15 lb weight loss. They are planning chemotherapy and radiation therapy. The course and has requested a feeding gastrostomy tube to help him through the swelling and salivary problems with the treatment and the Port-A-Cath to facilitate chemotherapy. This is scheduled for March 23.   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 Inguinal hernia repair and did well with that 14 months ago.        Past Medical History   Diagnosis  Date   .  Diabetes mellitus without complication     .  Hyperlipidemia     .  Inguinal hernia     .  Stroke  2001       eye   .  Hypertension     .  Cataract     .  Tongue cancer  04/18/2013       Base of tongue         Past Surgical History   Procedure  Laterality  Date   .  Eye surgery    2000, 2001       cataract   .  Inguinal hernia repair    02/13/2012       Procedure:  HERNIA REPAIR INGUINAL ADULT;  Surgeon: Adin Hector, MD;  Location: [facility];  Service: General;  Laterality: Right;   .  Insertion of mesh    02/13/2012       Procedure: INSERTION OF MESH;  Surgeon: Adin Hector, MD;  Location: [facility];  Service: General;  Laterality: N/A;         Family History   Problem  Relation  Age of Onset   .  Cancer  Mother  40       breast ca   .  Stroke  Father     .  Cancer  Brother  36       prostate ca   .  Heart disease  Brother  Social History History   Substance Use Topics   .  Smoking status:  Never Smoker    .  Smokeless tobacco:  Never Used   .  Alcohol Use:  Yes         Comment: glass of wine - 4 days a week        No Known Allergies    Current Outpatient Prescriptions   Medication  Sig  Dispense  Refill   .  aspirin 81 MG tablet  Take 81 mg by mouth daily.         Marland Kitchen  atorvastatin (LIPITOR) 10 MG tablet  Take 10 mg by mouth daily.         Marland Kitchen  glipiZIDE (GLUCOTROL XL) 5 MG 24 hr tablet  Take 5 mg by mouth daily.         Marland Kitchen  ibuprofen (ADVIL,MOTRIN) 200 MG tablet  Take 200 mg by mouth every 6 (six) hours as needed.         .  metFORMIN (GLUCOPHAGE) 850 MG tablet  Take 850 mg by mouth 2 (two) times daily with a meal.         .  Multiple Vitamins-Minerals (PRESERVISION AREDS 2) CAPS  Take 1 capsule by mouth 2 (two) times daily.         .  sodium fluoride (FLUORISHIELD) 1.1 % GEL dental gel  Instill one drop of fluoride per toothspace of fluoride tray. Place over teeth for 5 minutes. Remove. Spit out excess. Repeat nightly.   120 mL   prn   .  valsartan-hydrochlorothiazide (DIOVAN-HCT) 320-12.5 MG per tablet                 Review of Systems   Constitutional: Positive for unexpected weight change. Negative for fever and chills.  HENT: Positive for hearing loss, trouble swallowing and voice change. Negative for congestion and sore throat.   Eyes: Negative for visual disturbance.  Respiratory: Negative for cough and  wheezing.   Cardiovascular: Negative for chest pain, palpitations and leg swelling.  Gastrointestinal: Negative for nausea, vomiting, abdominal pain, diarrhea, constipation, blood in stool, abdominal distention, anal bleeding and rectal pain.  Genitourinary: Negative for hematuria and difficulty urinating.  Musculoskeletal: Negative for arthralgias.  Skin: Negative for rash and wound.  Neurological: Negative for seizures, syncope, weakness and headaches.  Hematological: Negative for adenopathy. Does not bruise/bleed easily.  Psychiatric/Behavioral: Negative for confusion.      Blood pressure 108/76, pulse 66, resp. rate 12, height 6\' 2"  (1.88 m), weight 174 lb 6.4 oz (79.107 kg).   Physical Exam  Constitutional: He is oriented to person, place, and time. He appears well-developed and well-nourished. No distress.  HENT:   Head: Normocephalic.   Nose: Nose normal.   Mouth/Throat: No oropharyngeal exudate.  Eyes: Conjunctivae and EOM are normal. Pupils are equal, round, and reactive to light. Right eye exhibits no discharge. Left eye exhibits no discharge. No scleral icterus.  Neck: Normal range of motion. Neck supple. No JVD present. No tracheal deviation present. No thyromegaly present.  Cardiovascular: Normal rate, regular rhythm, normal heart sounds and intact distal pulses.    No murmur heard. Pulmonary/Chest: Effort normal and breath sounds normal. No stridor. No respiratory distress. He has no wheezes. He has no rales. He exhibits no tenderness.  Clavicles and suprasternal notch without deformity.  Abdominal: Soft. Bowel sounds are normal. He exhibits no distension and no mass. There is no tenderness. There is no rebound and no guarding.  Musculoskeletal: Normal range of motion. He exhibits no edema and no tenderness.  Lymphadenopathy:    He has cervical adenopathy.  Neurological: He is alert and oriented to person, place, and time. He has normal reflexes. Coordination normal.   Skin: Skin is warm and dry. No rash noted. He is not diaphoretic. No erythema. No pallor.  Psychiatric: He has a normal mood and affect. His behavior is normal. Judgment and thought content normal.      Data Reviewed Office notes from [place] center. Imaging studies.   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      Plan    Scheduled for Port-A-Cath insertion with C-arm and ultrasound, laparoscopic gastrostomy tube placement, possible open   I discussed the indications, details, techniques, and numerous risk of the surgery with him. He is aware of the risk of bleeding, infection, pneumothorax, malfunction of either catheter requiring revision, intra-abdominal leakage, injury to adjacent organs. He understands all these issues well, given his medical background. All his questions were answered. He is in full agreement with this plan.   We talked about care of the Port-A-Cath and care of the feeding tube and he seems to understand all this quite well.         Edsel Petrin. Dalbert Batman, M.D., [facility] General and Minimally invasive Surgery Breast and Colorectal Surgery Office:   [phone] Pager:   [phone]

## 2013-05-04 MED ORDER — CEFAZOLIN SODIUM-DEXTROSE 2-3 GM-% IV SOLR
2.0000 g | INTRAVENOUS | Status: AC
  Administered 2013-05-05: 2 g via INTRAVENOUS
  Filled 2013-05-04: qty 50

## 2013-05-05 ENCOUNTER — Ambulatory Visit (HOSPITAL_COMMUNITY): Payer: Medicare Other

## 2013-05-05 ENCOUNTER — Other Ambulatory Visit: Payer: Self-pay | Admitting: Hematology and Oncology

## 2013-05-05 ENCOUNTER — Encounter (HOSPITAL_COMMUNITY): Payer: Medicare Other | Admitting: Anesthesiology

## 2013-05-05 ENCOUNTER — Ambulatory Visit (HOSPITAL_COMMUNITY)
Admission: RE | Admit: 2013-05-05 | Discharge: 2013-05-05 | Disposition: A | Discharge status: Home / Self Care | Payer: Medicare Other | Source: Ambulatory Visit | Attending: General Surgery | Admitting: General Surgery

## 2013-05-05 ENCOUNTER — Telehealth: Payer: Self-pay | Admitting: *Deleted

## 2013-05-05 ENCOUNTER — Encounter: Payer: Self-pay | Admitting: Hematology and Oncology

## 2013-05-05 ENCOUNTER — Ambulatory Visit (HOSPITAL_COMMUNITY): Payer: Medicare Other | Admitting: Anesthesiology

## 2013-05-05 ENCOUNTER — Encounter (HOSPITAL_COMMUNITY): Payer: Self-pay | Admitting: Anesthesiology

## 2013-05-05 ENCOUNTER — Encounter (HOSPITAL_COMMUNITY)
Admission: RE | Disposition: A | Discharge status: Home / Self Care | Payer: Self-pay | Source: Ambulatory Visit | Attending: General Surgery

## 2013-05-05 DIAGNOSIS — H269 Unspecified cataract: Secondary | ICD-10-CM | POA: Insufficient documentation

## 2013-05-05 DIAGNOSIS — C770 Secondary and unspecified malignant neoplasm of lymph nodes of head, face and neck: Secondary | ICD-10-CM | POA: Diagnosis not present

## 2013-05-05 DIAGNOSIS — I509 Heart failure, unspecified: Secondary | ICD-10-CM | POA: Insufficient documentation

## 2013-05-05 DIAGNOSIS — E119 Type 2 diabetes mellitus without complications: Secondary | ICD-10-CM | POA: Diagnosis not present

## 2013-05-05 DIAGNOSIS — E1139 Type 2 diabetes mellitus with other diabetic ophthalmic complication: Secondary | ICD-10-CM | POA: Diagnosis not present

## 2013-05-05 DIAGNOSIS — I10 Essential (primary) hypertension: Secondary | ICD-10-CM | POA: Diagnosis not present

## 2013-05-05 DIAGNOSIS — E11319 Type 2 diabetes mellitus with unspecified diabetic retinopathy without macular edema: Secondary | ICD-10-CM | POA: Diagnosis not present

## 2013-05-05 DIAGNOSIS — I252 Old myocardial infarction: Secondary | ICD-10-CM | POA: Insufficient documentation

## 2013-05-05 DIAGNOSIS — I2510 Atherosclerotic heart disease of native coronary artery without angina pectoris: Secondary | ICD-10-CM | POA: Diagnosis not present

## 2013-05-05 DIAGNOSIS — Z8673 Personal history of transient ischemic attack (TIA), and cerebral infarction without residual deficits: Secondary | ICD-10-CM | POA: Insufficient documentation

## 2013-05-05 DIAGNOSIS — Z452 Encounter for adjustment and management of vascular access device: Secondary | ICD-10-CM | POA: Diagnosis not present

## 2013-05-05 DIAGNOSIS — C01 Malignant neoplasm of base of tongue: Secondary | ICD-10-CM | POA: Diagnosis present

## 2013-05-05 DIAGNOSIS — R918 Other nonspecific abnormal finding of lung field: Secondary | ICD-10-CM | POA: Diagnosis not present

## 2013-05-05 HISTORY — PX: LAPAROSCOPIC GASTROSTOMY: SHX5896

## 2013-05-05 HISTORY — PX: PORTACATH PLACEMENT: SHX2246

## 2013-05-05 LAB — GLUCOSE, CAPILLARY
Glucose-Capillary: 107 mg/dL — ABNORMAL HIGH (ref 70–99)
Glucose-Capillary: 159 mg/dL — ABNORMAL HIGH (ref 70–99)

## 2013-05-05 SURGERY — INSERTION, TUNNELED CENTRAL VENOUS DEVICE, WITH PORT
Anesthesia: General | Site: Chest

## 2013-05-05 MED ORDER — LIDOCAINE HCL (CARDIAC) 20 MG/ML IV SOLN
INTRAVENOUS | Status: DC | PRN
  Administered 2013-05-05: 60 mg via INTRAVENOUS

## 2013-05-05 MED ORDER — NEOSTIGMINE METHYLSULFATE 1 MG/ML IJ SOLN
INTRAMUSCULAR | Status: DC | PRN
  Administered 2013-05-05: 4 mg via INTRAVENOUS

## 2013-05-05 MED ORDER — ROCURONIUM BROMIDE 50 MG/5ML IV SOLN
INTRAVENOUS | Status: AC
  Filled 2013-05-05: qty 1

## 2013-05-05 MED ORDER — 0.9 % SODIUM CHLORIDE (POUR BTL) OPTIME
TOPICAL | Status: DC | PRN
  Administered 2013-05-05: 1000 mL

## 2013-05-05 MED ORDER — ROCURONIUM BROMIDE 100 MG/10ML IV SOLN
INTRAVENOUS | Status: DC | PRN
  Administered 2013-05-05: 10 mg via INTRAVENOUS
  Administered 2013-05-05: 40 mg via INTRAVENOUS

## 2013-05-05 MED ORDER — DEXAMETHASONE SODIUM PHOSPHATE 4 MG/ML IJ SOLN
INTRAMUSCULAR | Status: DC | PRN
  Administered 2013-05-05: 4 mg via INTRAVENOUS

## 2013-05-05 MED ORDER — SODIUM CHLORIDE 0.9 % IV SOLN: 250.0000 mL | INTRAVENOUS | Status: DC | PRN

## 2013-05-05 MED ORDER — STERILE WATER FOR INJECTION IJ SOLN
INTRAMUSCULAR | Status: AC
  Filled 2013-05-05: qty 10

## 2013-05-05 MED ORDER — FENTANYL CITRATE 0.05 MG/ML IJ SOLN
INTRAMUSCULAR | Status: AC
  Filled 2013-05-05: qty 5

## 2013-05-05 MED ORDER — FENTANYL CITRATE 0.05 MG/ML IJ SOLN: 25.0000 ug | INTRAMUSCULAR | Status: DC | PRN

## 2013-05-05 MED ORDER — LACTATED RINGERS IV SOLN
INTRAVENOUS | Status: DC
  Administered 2013-05-05: 11:00:00 via INTRAVENOUS

## 2013-05-05 MED ORDER — SODIUM CHLORIDE 0.9 % IR SOLN
Status: DC | PRN
  Administered 2013-05-05: 12:00:00

## 2013-05-05 MED ORDER — ONDANSETRON HCL 4 MG/2ML IJ SOLN
INTRAMUSCULAR | Status: DC | PRN
  Administered 2013-05-05: 4 mg via INTRAVENOUS

## 2013-05-05 MED ORDER — HYDROCODONE-ACETAMINOPHEN 5-325 MG PO TABS: 1.0000 | ORAL_TABLET | ORAL | Status: DC | PRN

## 2013-05-05 MED ORDER — ONDANSETRON HCL 4 MG/2ML IJ SOLN
INTRAMUSCULAR | Status: AC
  Filled 2013-05-05: qty 2

## 2013-05-05 MED ORDER — LACTATED RINGERS IV SOLN
INTRAVENOUS | Status: DC | PRN
  Administered 2013-05-05 (×3): via INTRAVENOUS

## 2013-05-05 MED ORDER — PHENYLEPHRINE HCL 10 MG/ML IJ SOLN
INTRAMUSCULAR | Status: AC
  Filled 2013-05-05: qty 1

## 2013-05-05 MED ORDER — PROPOFOL 10 MG/ML IV BOLUS
INTRAVENOUS | Status: DC | PRN
  Administered 2013-05-05: 160 mg via INTRAVENOUS
  Administered 2013-05-05: 50 mg via INTRAVENOUS
  Administered 2013-05-05: 40 mg via INTRAVENOUS
  Administered 2013-05-05: 50 mg via INTRAVENOUS

## 2013-05-05 MED ORDER — CHLORHEXIDINE GLUCONATE 4 % EX LIQD
1.0000 "application " | Freq: Once | CUTANEOUS | Status: DC
  Filled 2013-05-05: qty 15

## 2013-05-05 MED ORDER — FENTANYL CITRATE 0.05 MG/ML IJ SOLN
INTRAMUSCULAR | Status: DC | PRN
  Administered 2013-05-05: 100 ug via INTRAVENOUS
  Administered 2013-05-05 (×2): 50 ug via INTRAVENOUS

## 2013-05-05 MED ORDER — HEPARIN SOD (PORK) LOCK FLUSH 100 UNIT/ML IV SOLN
INTRAVENOUS | Status: AC
  Filled 2013-05-05: qty 5

## 2013-05-05 MED ORDER — MIDAZOLAM HCL 2 MG/2ML IJ SOLN
INTRAMUSCULAR | Status: AC
  Filled 2013-05-05: qty 2

## 2013-05-05 MED ORDER — PROPOFOL 10 MG/ML IV BOLUS
INTRAVENOUS | Status: AC
  Filled 2013-05-05: qty 20

## 2013-05-05 MED ORDER — SODIUM CHLORIDE 0.9 % IV SOLN: INTRAVENOUS | Status: DC

## 2013-05-05 MED ORDER — ACETAMINOPHEN 650 MG RE SUPP: 650.0000 mg | RECTAL | Status: DC | PRN

## 2013-05-05 MED ORDER — OXYCODONE HCL 5 MG PO TABS: 5.0000 mg | ORAL_TABLET | ORAL | Status: DC | PRN

## 2013-05-05 MED ORDER — PHENYLEPHRINE HCL 10 MG/ML IJ SOLN
INTRAMUSCULAR | Status: DC | PRN
  Administered 2013-05-05 (×6): 40 ug via INTRAVENOUS

## 2013-05-05 MED ORDER — BUPIVACAINE-EPINEPHRINE PF 0.25-1:200000 % IJ SOLN
INTRAMUSCULAR | Status: AC
  Filled 2013-05-05: qty 30

## 2013-05-05 MED ORDER — LIDOCAINE HCL (CARDIAC) 20 MG/ML IV SOLN
INTRAVENOUS | Status: AC
  Filled 2013-05-05: qty 5

## 2013-05-05 MED ORDER — SODIUM CHLORIDE 0.9 % IJ SOLN: 3.0000 mL | Freq: Two times a day (BID) | INTRAMUSCULAR | Status: DC

## 2013-05-05 MED ORDER — VECURONIUM BROMIDE 10 MG IV SOLR
INTRAVENOUS | Status: AC
  Filled 2013-05-05: qty 10

## 2013-05-05 MED ORDER — ACETAMINOPHEN 325 MG PO TABS: 650.0000 mg | ORAL_TABLET | ORAL | Status: DC | PRN

## 2013-05-05 MED ORDER — VECURONIUM BROMIDE 10 MG IV SOLR
INTRAVENOUS | Status: DC | PRN
  Administered 2013-05-05 (×2): 1 mg via INTRAVENOUS

## 2013-05-05 MED ORDER — HEPARIN SOD (PORK) LOCK FLUSH 100 UNIT/ML IV SOLN
INTRAVENOUS | Status: DC | PRN
  Administered 2013-05-05: 500 [IU]
  Administered 2013-05-05: 500 [IU] via INTRAVENOUS

## 2013-05-05 MED ORDER — BUPIVACAINE-EPINEPHRINE 0.25% -1:200000 IJ SOLN
INTRAMUSCULAR | Status: DC | PRN
  Administered 2013-05-05 (×2): 30 mL

## 2013-05-05 MED ORDER — GLYCOPYRROLATE 0.2 MG/ML IJ SOLN
INTRAMUSCULAR | Status: DC | PRN
  Administered 2013-05-05: .5 mg via INTRAVENOUS

## 2013-05-05 MED ORDER — SODIUM CHLORIDE 0.9 % IJ SOLN: 3.0000 mL | INTRAMUSCULAR | Status: DC | PRN

## 2013-05-05 MED ORDER — SODIUM CHLORIDE 0.9 % IR SOLN
Status: DC | PRN
  Administered 2013-05-05: 1000 mL

## 2013-05-05 SURGICAL SUPPLY — 90 items
ADH SKN CLS APL DERMABOND .7 (GAUZE/BANDAGES/DRESSINGS) ×2
BAG DECANTER FOR FLEXI CONT (MISCELLANEOUS) ×4 IMPLANT
BAG URINE DRAINAGE (UROLOGICAL SUPPLIES) IMPLANT
BLADE SURG 10 STRL SS (BLADE) ×4 IMPLANT
BLADE SURG 15 STRL LF DISP TIS (BLADE) ×2 IMPLANT
BLADE SURG 15 STRL SS (BLADE) ×2
BLADE SURG ROTATE 9660 (MISCELLANEOUS) ×4 IMPLANT
CANISTER SUCTION 2500CC (MISCELLANEOUS) ×4 IMPLANT
CATH FOLEY 2WAY 5CC 20FR (CATHETERS) IMPLANT
CATH FOLEY 2WAY SLVR  5CC 22FR (CATHETERS) ×2
CATH FOLEY 2WAY SLVR 5CC 22FR (CATHETERS) ×2 IMPLANT
CHLORAPREP W/TINT 10.5 ML (MISCELLANEOUS) ×4 IMPLANT
CHLORAPREP W/TINT 26ML (MISCELLANEOUS) ×4 IMPLANT
COVER PROBE W GEL 5X96 (DRAPES) IMPLANT
COVER SURGICAL LIGHT HANDLE (MISCELLANEOUS) ×8 IMPLANT
CRADLE DONUT ADULT HEAD (MISCELLANEOUS) ×4 IMPLANT
DECANTER SPIKE VIAL GLASS SM (MISCELLANEOUS) ×4 IMPLANT
DERMABOND ADVANCED (GAUZE/BANDAGES/DRESSINGS) ×4
DERMABOND ADVANCED .7 DNX12 (GAUZE/BANDAGES/DRESSINGS) ×4 IMPLANT
DEVICE TROCAR PUNCTURE CLOSURE (ENDOMECHANICALS) ×4 IMPLANT
DRAPE C-ARM 42X72 X-RAY (DRAPES) ×4 IMPLANT
DRAPE CHEST BREAST 15X10 FENES (DRAPES) ×4 IMPLANT
DRAPE LAPAROSCOPIC ABDOMINAL (DRAPES) ×4 IMPLANT
DRAPE UTILITY 15X26 W/TAPE STR (DRAPE) ×8 IMPLANT
ELECT CAUTERY BLADE 6.4 (BLADE) ×4 IMPLANT
ELECT REM PT RETURN 9FT ADLT (ELECTROSURGICAL) ×4
ELECTRODE REM PT RTRN 9FT ADLT (ELECTROSURGICAL) ×2 IMPLANT
GAUZE SPONGE 4X4 16PLY XRAY LF (GAUZE/BANDAGES/DRESSINGS) ×4 IMPLANT
GLOVE BIO SURGEON STRL SZ7.5 (GLOVE) ×4 IMPLANT
GLOVE BIO SURGEON STRL SZ8 (GLOVE) IMPLANT
GLOVE BIOGEL PI IND STRL 6.5 (GLOVE) ×2 IMPLANT
GLOVE BIOGEL PI IND STRL 7.0 (GLOVE) ×4 IMPLANT
GLOVE BIOGEL PI IND STRL 8 (GLOVE) ×2 IMPLANT
GLOVE BIOGEL PI INDICATOR 6.5 (GLOVE) ×2
GLOVE BIOGEL PI INDICATOR 7.0 (GLOVE) ×4
GLOVE BIOGEL PI INDICATOR 8 (GLOVE) ×2
GLOVE EUDERMIC 7 POWDERFREE (GLOVE) ×8 IMPLANT
GLOVE SURG SS PI 6.5 STRL IVOR (GLOVE) ×4 IMPLANT
GLOVE SURG SS PI 7.0 STRL IVOR (GLOVE) ×8 IMPLANT
GOWN STRL REUS W/ TWL LRG LVL3 (GOWN DISPOSABLE) ×8 IMPLANT
GOWN STRL REUS W/ TWL XL LVL3 (GOWN DISPOSABLE) ×4 IMPLANT
GOWN STRL REUS W/TWL LRG LVL3 (GOWN DISPOSABLE) ×12
GOWN STRL REUS W/TWL XL LVL3 (GOWN DISPOSABLE) ×4
INTRODUCER 13FR (MISCELLANEOUS) IMPLANT
INTRODUCER COOK 11FR (CATHETERS) IMPLANT
KIT BASIN OR (CUSTOM PROCEDURE TRAY) ×8 IMPLANT
KIT PORT POWER 8FR ISP CVUE (Catheter) ×4 IMPLANT
KIT PORT POWER 9.6FR MRI PREA (Catheter) IMPLANT
KIT PORT POWER ISP 8FR (Catheter) IMPLANT
KIT POWER CATH 8FR (Catheter) IMPLANT
KIT ROOM TURNOVER OR (KITS) ×4 IMPLANT
NEEDLE 22X1 1/2 (OR ONLY) (NEEDLE) ×4 IMPLANT
NEEDLE HYPO 25GX1X1/2 BEV (NEEDLE) ×8 IMPLANT
NS IRRIG 1000ML POUR BTL (IV SOLUTION) ×4 IMPLANT
PACK SURGICAL SETUP 50X90 (CUSTOM PROCEDURE TRAY) ×4 IMPLANT
PAD ARMBOARD 7.5X6 YLW CONV (MISCELLANEOUS) ×8 IMPLANT
PENCIL BUTTON HOLSTER BLD 10FT (ELECTRODE) ×4 IMPLANT
PLUG CATH AND CAP STER (CATHETERS) ×4 IMPLANT
SCISSORS LAP 5X35 DISP (ENDOMECHANICALS) IMPLANT
SET INTRODUCER 12FR PACEMAKER (SHEATH) IMPLANT
SET IRRIG TUBING LAPAROSCOPIC (IRRIGATION / IRRIGATOR) IMPLANT
SET SHEATH INTRODUCER 10FR (MISCELLANEOUS) IMPLANT
SHEATH COOK PEEL AWAY SET 9F (SHEATH) IMPLANT
SLEEVE ADV FIXATION 5X100MM (TROCAR) ×8 IMPLANT
SLEEVE ENDOPATH XCEL 5M (ENDOMECHANICALS) ×4 IMPLANT
SLEEVE SURGEON STRL (DRAPES) ×4 IMPLANT
SPECIMEN JAR SMALL (MISCELLANEOUS) ×4 IMPLANT
SPONGE GAUZE 4X4 12PLY STER LF (GAUZE/BANDAGES/DRESSINGS) ×4 IMPLANT
SURGILUBE 3G PEEL PACK STRL (MISCELLANEOUS) IMPLANT
SUT ETHILON 2 0 FS 18 (SUTURE) ×4 IMPLANT
SUT MNCRL AB 4-0 PS2 18 (SUTURE) ×8 IMPLANT
SUT PROLENE 2 0 CT2 30 (SUTURE) ×8 IMPLANT
SUT PROLENE 2 0 SH DA (SUTURE) ×24 IMPLANT
SUT SILK 2 0 SH (SUTURE) ×4 IMPLANT
SUT VIC AB 3-0 SH 18 (SUTURE) ×4 IMPLANT
SUT VIC AB 4-0 PS2 27 (SUTURE) ×4 IMPLANT
SYR 5ML LUER SLIP (SYRINGE) ×4 IMPLANT
SYR BULB 3OZ (MISCELLANEOUS) ×4 IMPLANT
SYR CONTROL 10ML LL (SYRINGE) ×8 IMPLANT
SYRINGE 10CC LL (SYRINGE) ×4 IMPLANT
TAPE CLOTH SURG 6X10 WHT LF (GAUZE/BANDAGES/DRESSINGS) ×4 IMPLANT
TOWEL OR 17X24 6PK STRL BLUE (TOWEL DISPOSABLE) IMPLANT
TOWEL OR 17X26 10 PK STRL BLUE (TOWEL DISPOSABLE) ×4 IMPLANT
TRAY LAPAROSCOPIC (CUSTOM PROCEDURE TRAY) ×4 IMPLANT
TROCAR XCEL BLUNT TIP 100MML (ENDOMECHANICALS) ×4 IMPLANT
TROCAR XCEL NON-BLD 5MMX100MML (ENDOMECHANICALS) ×4 IMPLANT
TUBE CONNECTING 12'X1/4 (SUCTIONS)
TUBE CONNECTING 12X1/4 (SUCTIONS) IMPLANT
WATER STERILE IRR 1000ML POUR (IV SOLUTION) IMPLANT
YANKAUER SUCT BULB TIP NO VENT (SUCTIONS) IMPLANT

## 2013-05-05 NOTE — Anesthesia Procedure Notes (Signed)
Procedure Name: Intubation Date/Time: 05/05/2013 12:26 PM Performed by: Jenne Campus Pre-anesthesia Checklist: Patient identified, Emergency Drugs available, Suction available, Patient being monitored and Timeout performed Patient Re-evaluated:Patient Re-evaluated prior to inductionOxygen Delivery Method: Circle system utilized Preoxygenation: Pre-oxygenation with 100% oxygen Intubation Type: IV induction Ventilation: Mask ventilation without difficulty Grade View: Grade II Tube type: Oral Tube size: 7.5 mm Number of attempts: 1 Airway Equipment and Method: Stylet and Video-laryngoscopy Placement Confirmation: ETT inserted through vocal cords under direct vision,  positive ETCO2,  CO2 detector and breath sounds checked- equal and bilateral Secured at: 23 cm Tube secured with: Tape Dental Injury: Teeth and Oropharynx as per pre-operative assessment  Difficulty Due To: Difficulty was anticipated Comments: Smooth IV induction. EZ mask. DL x 1 Mil 2 by [facility] CRNA. DLx1 MAC 3 by Dr. Ermalene Postin. No airway structures visualized. Blood in posterior pharynx prior to first DL. Video-laryngoscopy. Right side of tongue base bloody. Epiglottis not visualized. 7.5 ETT easily passed. +ETCO2. Bbse. Oropharynx suctioned. OGT passed.

## 2013-05-05 NOTE — Interval H&P Note (Signed)
History and Physical Interval Note:  05/05/2013 12:04 PM  Brent Franklin  has presented today for surgery, with the diagnosis of tongue cancer  The various methods of treatment have been discussed with the patient and family. After consideration of risks, benefits and other options for treatment, the patient has consented to  Procedure(s): INSERTION PORT-A-CATH (N/A) LAPAROSCOPIC GASTROSTOMY (N/A), possible open as a surgical intervention .  The patient's history has been reviewed, patient examined, no change in status, stable for surgery.  I have reviewed the patient's chart and labs.  Questions were answered to the patient's satisfaction.     Adin Hector

## 2013-05-05 NOTE — Discharge Instructions (Signed)
Flush the gastrostomy tube with 10-20 cc of room temperature tap water twice a day.  You may shower, but do not take a tub bath  After you shower, change the drain sponge around the gastrostomy tube so that you have a dry dressing on it at all times.  The dietitian at the cancer center will formulate a plan for nutritional support and extra water to be given through the gastrostomy tube once you are well into your chemotherapy and radiation therapy treatments  Call Dr. Dalbert Batman if you have any problems. Especially call if you have any problems with bleeding, severe pain, or shortness of breath.       Implanted [place] Guide An implanted port is a type of central line that is placed under the skin. Central lines are used to provide IV access when treatment or nutrition needs to be given through a person's veins. Implanted ports are used for long-term IV access. An implanted port may be placed because:   You need IV medicine that would be irritating to the small veins in your hands or arms.   You need long-term IV medicines, such as antibiotics.   You need IV nutrition for a long period.   You need frequent blood draws for lab tests.   You need dialysis.  Implanted ports are usually placed in the chest area, but they can also be placed in the upper arm, the abdomen, or the leg. An implanted port has two main parts:   Reservoir. The reservoir is round and will appear as a small, raised area under your skin. The reservoir is the part where a needle is inserted to give medicines or draw blood.   Catheter. The catheter is a thin, flexible tube that extends from the reservoir. The catheter is placed into a large vein. Medicine that is inserted into the reservoir goes into the catheter and then into the vein.  HOW WILL I CARE FOR MY INCISION SITE? Do not get the incision site wet. Bathe or shower as directed by your health care provider.  HOW IS MY PORT ACCESSED? Special steps must  be taken to access the port:   Before the port is accessed, a numbing cream can be placed on the skin. This helps numb the skin over the port site.   Your health care provider uses a sterile technique to access the port.  Your health care provider must put on a mask and sterile gloves.  The skin over your port is cleaned carefully with an antiseptic and allowed to dry.  The port is gently pinched between sterile gloves, and a needle is inserted into the port.  Only "non-coring" port needles should be used to access the port. Once the port is accessed, a blood return should be checked. This helps ensure that the port is in the vein and is not clogged.   If your port needs to remain accessed for a constant infusion, a clear (transparent) bandage will be placed over the needle site. The bandage and needle will need to be changed every week, or as directed by your health care provider.   Keep the bandage covering the needle clean and dry. Do not get it wet. Follow your health care provider's instructions on how to take a shower or bath while the port is accessed.   If your port does not need to stay accessed, no bandage is needed over the port.  WHAT IS FLUSHING? Flushing helps keep the port from getting clogged.  Follow your health care provider's instructions on how and when to flush the port. Ports are usually flushed with saline solution or a medicine called heparin. The need for flushing will depend on how the port is used.   If the port is used for intermittent medicines or blood draws, the port will need to be flushed:   After medicines have been given.   After blood has been drawn.   As part of routine maintenance.   If a constant infusion is running, the port may not need to be flushed.  HOW LONG WILL MY PORT STAY IMPLANTED? The port can stay in for as long as your health care provider thinks it is needed. When it is time for the port to come out, surgery will be done to  remove it. The procedure is similar to the one performed when the port was put in.  WHEN SHOULD I SEEK IMMEDIATE MEDICAL CARE? When you have an implanted port, you should seek immediate medical care if:   You notice a bad smell coming from the incision site.   You have swelling, redness, or drainage at the incision site.   You have more swelling or pain at the port site or the surrounding area.   You have a fever that is not controlled with medicine. Document Released: 01/30/2005 Document Revised: 11/20/2012 Document Reviewed: 10/07/2012 [facility] Patient Information 2014 [place].       Gastrostomy Tube Home Guide A gastrostomy tube is a tube that is surgically placed into the stomach. It is also called a "G-tube." G-tubes are used when a person is unable to eat and drink enough on their own to stay healthy. The tube is inserted into the stomach through a small cut (incision) in the skin. This tube is used for:  Feeding.  Giving medication. GASTROSTOMY TUBE CARE  Wash your hands with soap and water.  Remove the old dressing (if any). Some styles of G-tubes may need a dressing inserted between the skin and the G-tube. Other types of G-tubes do not require a dressing. Ask your health care provider if a dressing is needed.  Check the area where the tube enters the skin (insertion site) for redness, swelling, or pus-like (purulent) drainage. A small amount of clear or tan liquid drainage is normal. Check to make sure scar tissue (skin) is not growing around the insertion site. This could have a raised, bumpy appearance.  A cotton swab can be used to clean the skin around the tube:  When the G-tube is first put in, a normal saline solution or water can be used to clean the skin.  Mild soap and warm water can be used when the skin around the G-tube site has healed.  Roll the cotton swab around the G-tube insertion site to remove any drainage or crusting at the insertion  site. STOMACH RESIDUALS Feeding tube residuals are the amount of liquids that are in the stomach at any given time. Residuals may be checked before giving feedings, medications, or as instructed by your health care provider.  Ask your health care provider if there are instances when you would not start tube feedings depending on the amount or type of contents withdrawn from the stomach.  Check residuals by attaching a syringe to the G-tube and pull back on the syringe plunger. Note the amount and return the residual back into the stomach. FLUSHING THE G-TUBE  The G-tube should be periodically flushed with clean warm water to keep it from  clogging.  Flush the G-tube after feedings or medications. Draw up 30 mL of warm water in a syringe. Connect the syringe to the G-tube and slowly push the water into the tube.  Do not push feedings, medications, or flushes rapidly. Flush the G-tube gently and slowly.  Only use syringes made for G-tubes to flush medications or feedings.  Your health care provider may want the G-tube flushed more often or with more water. If this is the case, follow your health care provider's instructions. FEEDINGS Your health care provider will determine whether feedings are given as a bolus (a certain amount given at one time and at scheduled times) or whether feedings will be given continuously on a feeding pump.   Formulas should be given at room temperature.  If feedings are continuous, no more than 4 hours worth of feedings should be placed in the feeding bag. This helps prevent spoilage or accidental excess infusion.  Cover and place unused formula in the refrigerator.  If feedings are continuous, stop the feedings when medications or flushes are given. Be sure to restart the feedings.  Feeding bags and syringes should be replaced as instructed by your health care provider. GIVING MEDICATION   In general, it is best if all medications are in a liquid form for  G-tube administration. Liquid medications are less likely to clog the G-tube.  Mix the liquid medication with 30 mL (or amount recommended by your health care provider) of warm water.  Draw up the medication into the syringe.  Attach the syringe to the G-tube and slowly push the mixture into the G-tube.  After giving the medication, draw up 30 mL of warm water in the syringe and slowly flush the G-tube.  For pills or capsules, check with your health care provider first before crushing medications. Some pills are not effective if they are crushed. Some capsules are sustained release medications.  If appropriate, crush the pill or capsule and mix with 30 mL of warm water. Using the syringe, slowly push the medication through the tube, then flush the tube with another 30 mL of tap water. G-TUBE PROBLEMS G-tube was pulled out.  Cause: May have been pulled out accidentally.  Solutions: Cover the opening with clean dressing and tape. Call your health care provider right away. The G-tube should be put in as soon as possible (within 4 hours) so the G-tube opening (tract) does not close. The G-tube needs to be put in at a healthcare setting. An X-ray needs to be done to confirm placement before the G-tube can be used again. Redness, irritation, soreness, or foul odor around the gastrostomy site.  Cause: May be caused by leakage or infection.  Solutions: Call your health care provider right away. Large amount of leakage of fluid or mucus-like liquid present (a large amount means it soaks clothing).  Cause: Many reasons could cause the G-tube to leak.  Solutions: Call your health care provider to discuss the amount of leakage. Skin or scar tissue appears to be growing where tube enters skin.   Cause: Tissue growth may develop around the insertion site if the G-tube is moved or pulled on excessively.  Solutions: Secure tube with tape so that excess movement does not occur. Call your health care  provider. G-tube is clogged.  Cause: Thick formula or medication.  Solutions: Try to slowly push warm water into the tube with a large syringe. Never try to push any object into the tube to unclog it. Do not force  fluid into the G-tube. If you are unable to unclog the tube, call your health care provider right away. TIPS  Head of Bed [facility](HOB) position refers to the upright position of a person's upper body.  When giving medications or a feeding bolus, keep the [facility] up as told by your health care provider. Do this during the feeding and for 1 hour after the feeding or medication administration.  If continuous feedings are being given, it is best to keep the [facility] up as told by your health care provider. When ADLs (Activities of Daily Living) are performed and the [facility] needs to be flat, be sure to turn the feeding pump off. Restart the feeding pump when the [facility] is returned to the recommended height.  Do not pull or put tension on the tube.  To prevent fluid backflow, kink the G-tube before removing the cap or disconnecting a syringe.  Check the G-tube length every day. Measure from the insertion site to the end of the G-tube. If the length is longer than previous measurements, the tube may be coming out. Call your health care provider if you notice increasing G-tube length.  Oral care, such as brushing teeth, must be continued.  You may need to remove excess air (vent) from the G-tube. Your health care provider will tell you if this is needed.  Always call your health care provider if you have questions or problems with the G-tube. SEEK IMMEDIATE MEDICAL CARE IF:   You have severe abdominal pain, tenderness, or abdominal bloating (distension).  You have nausea or vomiting.  You are constipated or have problems moving your bowels.  The G-tube insertion site is red, swollen, has a foul smell, or has yellow or brown drainage.  You have difficulty breathing or shortness of breath.  You have a  fever.  You have a large amount of feeding tube residuals.  The G-tube is clogged and cannot be flushed. MAKE SURE YOU:   Understand these instructions.  Will watch your condition.  Will get help right away if you are not doing well or get worse. Document Released: 04/10/2001 Document Revised: 11/20/2012 Document Reviewed: 10/07/2012 [facility] Patient Information 2014 [place]. What to eat:  For your first meals, you should eat lightly; only small meals initially.  If you do not have nausea, you may eat larger meals.  Avoid spicy, greasy and heavy food.    General Anesthesia, Adult, Care After  Refer to this sheet in the next few weeks. These instructions provide you with information on caring for yourself after your procedure. Your health care provider may also give you more specific instructions. Your treatment has been planned according to current medical practices, but problems sometimes occur. Call your health care provider if you have any problems or questions after your procedure.  WHAT TO EXPECT AFTER THE PROCEDURE  After the procedure, it is typical to experience:  Sleepiness.  Nausea and vomiting. HOME CARE INSTRUCTIONS  For the first 24 hours after general anesthesia:  Have a responsible person with you.  Do not drive a car. If you are alone, do not take public transportation.  Do not drink alcohol.  Do not take medicine that has not been prescribed by your health care provider.  Do not sign important papers or make important decisions.  You may resume a normal diet and activities as directed by your health care provider.  Change bandages (dressings) as directed.  If you have questions or problems that seem related to general  anesthesia, call the hospital and ask for the anesthetist or anesthesiologist on call. SEEK MEDICAL CARE IF:  You have nausea and vomiting that continue the day after anesthesia.  You develop a rash. SEEK IMMEDIATE MEDICAL CARE IF:  You  have difficulty breathing.  You have chest pain.  You have any allergic problems. Document Released: 05/08/2000 Document Revised: 10/02/2012 Document Reviewed: 08/15/2012  [facility] Patient Information 2014 [place], [place].

## 2013-05-05 NOTE — Op Note (Signed)
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     Post op Diagnosis:    Same    Procedure:                 Insertion of 8 French power port ClearVue tunnelled venous vascular access device Using fluoroscopy for guidance and positioned Laparoscopic Stamm gastrostomy, 22 French Foley with 10 cc balloon.  Surgeon:                     Edsel Petrin. Dalbert Batman, M.D., FACS  Assistant:                      Ralene Ok, M.D.  Operative Indications:   Brent Franklin is a 80 y.o. male. He is referred by Dr. Heath Lark for consideration of Port-A-Cath placement and a gastrostomy feeding tube placement. Dr. Dagmar Hait is his PCP. Dr. Wilburn Cornelia is his otolaryngologist.  Had recently had swallowing difficulty, palpable cervical adenopathy, discomfort, slight voice change. 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 15 lb weight loss. They are planning chemotherapy and radiation therapy. Dr. Alvy Bimler has requested a feeding gastrostomy tube to help him through the swelling and salivary problems with the treatment and the Port-A-Cath to facilitate chemotherapy. Comorbidities include hypertension, non-insulin-dependent diabetes.   Operative Findings:       The Port-A-Cath was placed through the right subclavian vein and under fluoroscopy the tip appears to be in the superior vena cava at the right atrial junction. Survey of the abdomen laparoscopically showed no gross abnormalities or peritoneal disease.  Procedure in Detail:          Following the induction of general endotracheal anesthesia the patient was positioned with a roll behind his shoulders and his arms tucked at his sides. The neck and chest were prepped and draped in a sterile fashion. Surgical time out was performed and intravenous antibiotics were given. 0.5%  Marcaine with epinephrine was used as a local infiltration anesthetic.    A right subclavian venipuncture was performed with a single pass and a guidewire inserted into the superior vena cava under fluoroscopic guidance. Using the C-arm fluoroscopy I measured a template of the chest wall so that I could measure the catheter length. I did this so that  the tip of the catheter would be at the the superior vena cava and the right atrial junction. A transverse incision was made about 3 cm below the middle third of the clavicle. A subcutaneous  pocket was created at the level of the pectoralis muscle. Using a tunneling device I passed the catheter from the wire insertion site to the port pocket site. Using the template marked on the chest wall I measured the catheter 20.5 cm. Catheter was cut and secured to the port with the locking device and flushed with heparinized saline. The port was placed in the pocket. The dilator and peel-away sheath were inserted over the guidewire into the central venous circulation. The dilator and wire were removed and the catheter threaded into the peel-away sheath and the peel-away sheath removed. The catheter flushed easily and had excellent blood return. The catheter was flushed with heparinized saline. The port was sutured  to the pectoralis fascia with 3 interrupted sutures of 2-0 Prolene. The catheter was flushed with concentrated heparinized saline. Subcutaneous tissue was closed with 3-0 Vicryl sutures and the skin closed with subcuticular sutures of 4-0 Monocryl and Dermabond.    We then changed all of the drapes. The abdomen was prepped and draped in a sterile fashion. Another surgical time out performed. An 11 mm Hassan trocar was placed in the midline just above the umbilicus with an open technique and secured with a purse string suture of 0 Vicryl. Pneumoperitoneum was created. A camera was inserted. A 5 mm trocar was placed in the right upper quadrant a 5 mm trocar placed in  the left midabdomen. After surveying the abdomen and pelvis we examined the stomach and found a suitable part of the proximal antrum that would come up to the abdominal wall. I marked the intended gastrotomy site with the cautery. I placed a purse string suture of 2-0 silk in the anterior wall of the stomach. I placed a 2-0 Prolene suture at the 3:00 position and 12:00 position about a centimeter and a half away from the pursestring suture so that I could lift the stomach up. I passed the sutures through the abdominal wall using the Endo Close device, taking a small bite of fascia at each point.   I then made a small incision in the skin and passed a 22 French Foley catheter with a 10 cc balloon through the abdominal wall. I then completed the gastrotomy with a [place] clamp and I then passed the catheter into the stomach and blew the balloon up. The catheter flushed easily. I then tied the pursestring suture of 2-0 silk,  cut that and removed the needle. I then placed 2 more Prolene sutures in the anterior gastric wall at the 9:00 position and 6:00 position so I had 4 sutures to pull up against the anterior abdominal wall. These Prolene sutures were also brought out through the abdominal wall with the Endo Close device. I then pulled these up to the abdominal wall and tied the sutures. I pulled the Foley catheter with the inflated balloon up against the abdominal wall and sutured the catheter to the skin with a 2-0 nylon suture. The catheter flushed easily. It appeared well positioned and there was no bleeding. I looked around the abdomen and pelvis and some other no other abnormalities. We released the pneumoperitoneum. The fascia at the umbilicus was closed with 0 Vicryl sutures. All the skin incisions were closed with subcuticular sutures of 4-0 Monocryl and Dermabond. A clean drain sponge is placed on the gastrostomy tube site. The patient tolerated the procedure well was taken to the PACU in stable condition.  EBL 20 cc. Counts correct. Complications none.     Edsel Petrin. Dalbert Batman, M.D., FACS General and Minimally Invasive Surgery Breast and Colorectal Surgery  05/05/2013 2:57 PM

## 2013-05-05 NOTE — Preoperative (Signed)
Beta Blockers   Reason not to administer Beta Blockers:Not Applicable 

## 2013-05-05 NOTE — Transfer of Care (Signed)
Immediate Anesthesia Transfer of Care Note  Patient: Brent Franklin  Procedure(s) Performed: Procedure(s): INSERTION PORT-A-CATH (N/A) LAPAROSCOPIC GASTROSTOMY (N/A)  Patient Location: PACU  Anesthesia Type:General  Level of Consciousness: awake, sedated and patient cooperative  Airway & Oxygen Therapy: Patient Spontanous Breathing and Patient connected to nasal cannula oxygen  Post-op Assessment: Report given to PACU RN and Post -op Vital signs reviewed and stable  Post vital signs: Reviewed  Complications: No apparent anesthesia complications

## 2013-05-05 NOTE — Telephone Encounter (Signed)
Left message that lab appointment for 3/24 is cancelled. Dr Alvy Bimler will see pt at 0900

## 2013-05-05 NOTE — Anesthesia Preprocedure Evaluation (Addendum)
Anesthesia Evaluation  Patient identified by MRN, date of birth, ID band Patient awake    Reviewed: Allergy & Precautions, H&P , NPO status , Patient's Chart, lab work & pertinent test results  History of Anesthesia Complications Negative for: history of anesthetic complications  Airway Mallampati: II TM Distance: >3 FB Neck ROM: Full    Dental  (+) Teeth Intact, Dental Advisory Given   Pulmonary neg pulmonary ROS,  breath sounds clear to auscultation        Cardiovascular hypertension, Pt. on medications - angina- CAD, - Past MI, - CHF and - DOE Rhythm:Regular Rate:Normal     Neuro/Psych CVA, No Residual Symptoms negative psych ROS   GI/Hepatic negative GI ROS, Neg liver ROS,   Endo/Other  diabetes, Type 2, Oral Hypoglycemic Agents  Renal/GU negative Renal ROS     Musculoskeletal negative musculoskeletal ROS (+)   Abdominal   Peds  Hematology   Anesthesia Other Findings   Reproductive/Obstetrics negative OB ROS                          Anesthesia Physical Anesthesia Plan  ASA: III  Anesthesia Plan: General   Post-op Pain Management:    Induction: Intravenous  Airway Management Planned: Oral ETT  Additional Equipment: None  Intra-op Plan:   Post-operative Plan: Extubation in OR  Informed Consent: I have reviewed the patients History and Physical, chart, labs and discussed the procedure including the risks, benefits and alternatives for the proposed anesthesia with the patient or authorized representative who has indicated his/her understanding and acceptance.   Dental advisory given  Plan Discussed with: CRNA and Surgeon  Anesthesia Plan Comments:         Anesthesia Quick Evaluation

## 2013-05-06 ENCOUNTER — Encounter: Payer: Self-pay | Admitting: Hematology and Oncology

## 2013-05-06 ENCOUNTER — Ambulatory Visit (HOSPITAL_BASED_OUTPATIENT_CLINIC_OR_DEPARTMENT_OTHER): Payer: Medicare Other | Admitting: Hematology and Oncology

## 2013-05-06 ENCOUNTER — Encounter: Payer: Self-pay | Admitting: Radiation Oncology

## 2013-05-06 ENCOUNTER — Other Ambulatory Visit: Payer: Self-pay

## 2013-05-06 VITALS — BP 133/61 | HR 69 | Temp 97.3°F | Resp 18 | Ht 74.0 in | Wt 178.0 lb

## 2013-05-06 DIAGNOSIS — B977 Papillomavirus as the cause of diseases classified elsewhere: Secondary | ICD-10-CM | POA: Diagnosis not present

## 2013-05-06 DIAGNOSIS — C01 Malignant neoplasm of base of tongue: Secondary | ICD-10-CM

## 2013-05-06 DIAGNOSIS — R1310 Dysphagia, unspecified: Secondary | ICD-10-CM

## 2013-05-06 DIAGNOSIS — C770 Secondary and unspecified malignant neoplasm of lymph nodes of head, face and neck: Secondary | ICD-10-CM

## 2013-05-06 DIAGNOSIS — R634 Abnormal weight loss: Secondary | ICD-10-CM | POA: Diagnosis not present

## 2013-05-06 DIAGNOSIS — R799 Abnormal finding of blood chemistry, unspecified: Secondary | ICD-10-CM | POA: Diagnosis not present

## 2013-05-06 DIAGNOSIS — G4700 Insomnia, unspecified: Secondary | ICD-10-CM

## 2013-05-06 DIAGNOSIS — Z510 Encounter for antineoplastic radiation therapy: Secondary | ICD-10-CM | POA: Diagnosis not present

## 2013-05-06 DIAGNOSIS — R21 Rash and other nonspecific skin eruption: Secondary | ICD-10-CM | POA: Diagnosis not present

## 2013-05-06 DIAGNOSIS — R599 Enlarged lymph nodes, unspecified: Secondary | ICD-10-CM | POA: Diagnosis not present

## 2013-05-06 HISTORY — DX: Insomnia, unspecified: G47.00

## 2013-05-06 MED ORDER — ONDANSETRON HCL 8 MG PO TABS
8.0000 mg | ORAL_TABLET | Freq: Three times a day (TID) | ORAL | Status: DC | PRN
Start: 2013-05-06 — End: 2013-07-14

## 2013-05-06 MED ORDER — LIDOCAINE-PRILOCAINE 2.5-2.5 % EX CREA: 1.0000 "application " | TOPICAL_CREAM | CUTANEOUS | Status: DC | PRN

## 2013-05-06 MED ORDER — ZOLPIDEM TARTRATE 10 MG PO TABS: 10.0000 mg | ORAL_TABLET | Freq: Every evening | ORAL | Status: DC | PRN

## 2013-05-06 MED ORDER — PROMETHAZINE HCL 25 MG PO TABS: 25.0000 mg | ORAL_TABLET | Freq: Four times a day (QID) | ORAL | Status: DC | PRN

## 2013-05-06 NOTE — Progress Notes (Signed)
[place] OFFICE PROGRESS NOTE  Patient Care Team: Tivis Ringer, MD as PCP - General (Internal Medicine) Brooks Sailors, RN as Registered Nurse (Oncology) Heath Lark, MD as Consulting Physician (Hematology and Oncology)  DIAGNOSIS: Base of tongue squamous cell carcinoma, HPV pending  SUMMARY OF ONCOLOGIC HISTORY: Oncology History   Base of tongue cancer, HPV pending   Primary site: Lip and Oral Cavity (Bilateral)   Staging method: AJCC 7th Edition   Clinical: Stage IVA (T2, N2c, M0) signed by Heath Lark, MD on 04/18/2013  2:21 PM   Summary: Stage IVA (T2, N2c, M0)       Malignant neoplasm of base of tongue   04/01/2013 Imaging Ct scan of neck showed base of tongue cancer involving vallecula and bilateral LN metastasis   04/15/2013 Surgery Laryngoscopy and biopsy 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   05/05/2013 Procedure The patient had placement of port and feeding tube.    INTERVAL HISTORY: Brent Franklin 80 y.o. male returns for further followup. He is doing well. He has no concern for pain. He has placement of feeding tube and Infuse-a-Port.  I have reviewed the past medical history, past surgical history, social history and family history with the patient and they are unchanged from previous note.  ALLERGIES:  has No Known Allergies.  MEDICATIONS:  Current Outpatient Prescriptions  Medication Sig Dispense Refill  . aspirin 81 MG tablet Take 81 mg by mouth daily.      Marland Kitchen atorvastatin (LIPITOR) 10 MG tablet Take 10 mg by mouth every other day.       Marland Kitchen glipiZIDE (GLUCOTROL XL) 5 MG 24 hr tablet Take 5 mg by mouth daily.      Marland Kitchen HYDROcodone-acetaminophen (NORCO/VICODIN) 5-325 MG per tablet Take 1-2 tablets by mouth every 4 (four) hours as  needed.  40 tablet  0  . ibuprofen (ADVIL,MOTRIN) 200 MG tablet Take 400 mg by mouth every 6 (six) hours as needed (pain).       . metFORMIN (GLUCOPHAGE) 850 MG tablet Take 850 mg by mouth 2 (two) times daily with a meal.      . Multiple Vitamins-Minerals (PRESERVISION AREDS 2) CAPS Take 1 capsule by mouth 2 (two) times daily.      . sodium fluoride (FLUORISHIELD) 1.1 % GEL dental gel Place 1 application onto teeth at bedtime.      . valsartan-hydrochlorothiazide (DIOVAN-HCT) 320-12.5 MG per tablet Take 1 tablet by mouth daily.       Marland Kitchen lidocaine-prilocaine (EMLA) cream Apply 1 application topically as needed.  30 g  0  . ondansetron (ZOFRAN) 8 MG tablet Take 1 tablet (8 mg total) by mouth every 8 (eight) hours as needed for nausea.  30 tablet  3  . promethazine (PHENERGAN) 25 MG tablet Take 1 tablet (25 mg total) by mouth every 6 (six) hours as needed for nausea or vomiting.  30 tablet  3  . zolpidem (AMBIEN) 10 MG tablet Take 1 tablet (10 mg total) by mouth at bedtime as needed for sleep.  30 tablet  0   No current facility-administered medications for this visit.    REVIEW OF SYSTEMS:   All other systems were reviewed with the patient and are negative.  PHYSICAL EXAMINATION: ECOG PERFORMANCE STATUS: 0 - Asymptomatic  Filed Vitals:  05/06/13 0910  BP: 133/61  Pulse: 69  Temp: 97.3 F (36.3 C)  Resp: 18   Filed Weights   05/06/13 0910  Weight: 178 lb (80.74 kg)    GENERAL:alert, no distress and comfortable Musculoskeletal:no cyanosis of digits and no clubbing  NEURO: alert & oriented x 3 with fluent speech, no focal motor/sensory deficits  LABORATORY DATA:  I have reviewed the data as listed    Component Value Date/Time   NA 143 04/29/2013 0844   K 4.3 04/29/2013 0844   CL 103 04/29/2013 0844   CO2 29 04/29/2013 0844   GLUCOSE 106* 04/29/2013 0844   BUN 21.2 04/30/2013 1418   BUN 21 04/29/2013 0844   CREATININE 1.0 04/30/2013 1418   CREATININE 0.98 04/29/2013 0844   CALCIUM  9.4 04/29/2013 0844   PROT 6.7 04/29/2013 0844   ALBUMIN 3.8 04/29/2013 0844   AST 25 04/29/2013 0844   ALT 28 04/29/2013 0844   ALKPHOS 103 04/29/2013 0844   BILITOT 0.6 04/29/2013 0844   GFRNONAA 76* 04/29/2013 0844   GFRAA 88* 04/29/2013 0844    No results found for this basename: SPEP, UPEP,  kappa and lambda light chains    Lab Results  Component Value Date   WBC 6.4 04/29/2013   NEUTROABS 4.5 04/29/2013   HGB 13.7 04/29/2013   HCT 40.7 04/29/2013   MCV 86.2 04/29/2013   PLT 183 04/29/2013      Chemistry      Component Value Date/Time   NA 143 04/29/2013 0844   K 4.3 04/29/2013 0844   CL 103 04/29/2013 0844   CO2 29 04/29/2013 0844   BUN 21.2 04/30/2013 1418   BUN 21 04/29/2013 0844   CREATININE 1.0 04/30/2013 1418   CREATININE 0.98 04/29/2013 0844      Component Value Date/Time   CALCIUM 9.4 04/29/2013 0844   ALKPHOS 103 04/29/2013 0844   AST 25 04/29/2013 0844   ALT 28 04/29/2013 0844   BILITOT 0.6 04/29/2013 0844       RADIOGRAPHIC STUDIES: I have personally reviewed the radiological images as listed and agreed with the findings in the report. Chest Port 1 View  05/05/2013   CLINICAL DATA:  Postop evaluation status post porta catheter insertion  EXAM: PORTABLE CHEST - 1 VIEW  COMPARISON:  DG CHEST 2 VIEW dated 02/05/2012  FINDINGS: Low lung volumes. Right-sided porta catheter is appreciated via a subclavian approach tip projecting in the region of the distal superior vena cava. There is no evidence of pneumothorax. Increased density medial right lung base. The lungs otherwise clear. Degenerative changes appreciated within the mid thoracic spine.  IMPRESSION: Likely atelectasis right lung base considering the low lung volumes. An infiltrate cannot be excluded if clinically appropriate. No evidence of a pneumothorax. Lungs otherwise clear. If there is persistent clinical concern repeat evaluation with PA and lateral views recommended.   Electronically Signed   By: Margaree Mackintosh M.D.   On:  05/05/2013 17:14   Dg Fluoro Guide Cv Line-no Report  05/05/2013   CLINICAL DATA: port placement   FLOURO GUIDE CV LINE  Fluoroscopy was utilized by the requesting physician.  No radiographic  interpretation.       ASSESSMENT & PLAN:  #1 Base of tongue squamous cell carcinoma I have confirmed that the pathologist will be adding HPV status on his original biopsy. I reviewed the PET/CT scan with the patient. We discussed the role of chemotherapy. The intent is for cure.  We discussed some of  the risks, benefits, side-effects of Erbitux/cetuximab.  Some of the short term side-effects included, though not limited to, risk of fatigue, pancytopenia, life-threatening infections, allergic reactions, nausea, vomiting, sores in the mouth, changes in bowel habits especially diarrhea, admission to hospital for various reasons, and risks of death.   The patient is aware that the response rates discussed earlier is not guaranteed.    After a long discussion, patient made an informed decision to proceed with the prescribed plan of care and went ahead to sign the consent form today.   Patient education material was dispensed. #2 mild dysphagia He has appointment to see the speech pathologist #3 mild weight loss The patient had placement of feeding tube. We will consult health care agency for supplies. He will followup with the dietitian on ongoing basis. Orders Placed This Encounter  Procedures  . Basic metabolic panel    Standing Status: Future     Number of Occurrences:      Standing Expiration Date: 05/06/2014  . Magnesium    Standing Status: Future     Number of Occurrences:      Standing Expiration Date: 05/06/2014  . CBC with Differential    Standing Status: Future     Number of Occurrences:      Standing Expiration Date: 05/06/2014   All questions were answered. The patient knows to call the clinic with any problems, questions or concerns. No barriers to learning was detected. I spent  40 minutes counseling the patient face to face. The total time spent in the appointment was 55 minutes and more than 50% was on counseling and review of test results     [facility], [place], MD 05/06/2013 4:20 PM

## 2013-05-06 NOTE — Anesthesia Postprocedure Evaluation (Signed)
  Anesthesia Post-op Note  Patient: Brent Franklin  Procedure(s) Performed: Procedure(s): INSERTION PORT-A-CATH (N/A) LAPAROSCOPIC GASTROSTOMY (N/A)  Patient Location: PACU  Anesthesia Type:General  Level of Consciousness: awake, alert  and oriented  Airway and Oxygen Therapy: Patient Spontanous Breathing  Post-op Pain: mild  Post-op Assessment: Post-op Vital signs reviewed, Patient's Cardiovascular Status Stable, Respiratory Function Stable, Patent Airway, No signs of Nausea or vomiting and Pain level controlled  Post-op Vital Signs: Reviewed and stable  Complications: No apparent anesthesia complications

## 2013-05-06 NOTE — Progress Notes (Signed)
IMRT simulation/treatment planning note: The patient completed his IMRT simulation/treatment planning in the management of his locally advanced squamous cell carcinoma of the base of tongue metastatic to the bilateral neck. IMRT was chosen to decrease risk for both acute and late toxicity to the oral cavity, pharynx/esophagus, and spinal cord. We were unable to spare the left parotid gland because of his left-sided disease. Dose volume histograms were obtained, and we met our departmental guidelines for avoidance structures. We met our target goals with the exception of slight reduction in the volume of PTV 63 to avoid damage to the right brachial plexus. Dr. Isidore Moos has prescribed 7000 cGy in 35 sessions to his high-risk/gross disease PTV, 6300 cGy to his high-risk nodal region, and 5600 cGy to his low-risk nodal region. He is be planned to with 6 MV photons utilizing helical IMRT Tomotherapy. Dr. Pearlie Oyster requesting daily MV CT, setting up to his spine.

## 2013-05-07 ENCOUNTER — Ambulatory Visit
Admission: RE | Admit: 2013-05-07 | Discharge: 2013-05-07 | Disposition: A | Discharge status: Home / Self Care | Payer: Medicare Other | Source: Ambulatory Visit | Attending: Radiation Oncology | Admitting: Radiation Oncology

## 2013-05-07 ENCOUNTER — Encounter: Payer: Self-pay | Admitting: *Deleted

## 2013-05-07 ENCOUNTER — Telehealth: Payer: Self-pay | Admitting: Hematology and Oncology

## 2013-05-07 ENCOUNTER — Encounter: Payer: Self-pay | Admitting: Radiation Oncology

## 2013-05-07 ENCOUNTER — Ambulatory Visit (HOSPITAL_BASED_OUTPATIENT_CLINIC_OR_DEPARTMENT_OTHER): Payer: Medicare Other

## 2013-05-07 ENCOUNTER — Telehealth: Payer: Self-pay | Admitting: *Deleted

## 2013-05-07 VITALS — BP 119/61 | HR 80 | Temp 97.8°F

## 2013-05-07 DIAGNOSIS — C770 Secondary and unspecified malignant neoplasm of lymph nodes of head, face and neck: Secondary | ICD-10-CM

## 2013-05-07 DIAGNOSIS — Z5112 Encounter for antineoplastic immunotherapy: Secondary | ICD-10-CM | POA: Diagnosis not present

## 2013-05-07 DIAGNOSIS — R21 Rash and other nonspecific skin eruption: Secondary | ICD-10-CM | POA: Diagnosis not present

## 2013-05-07 DIAGNOSIS — R799 Abnormal finding of blood chemistry, unspecified: Secondary | ICD-10-CM | POA: Diagnosis not present

## 2013-05-07 DIAGNOSIS — Z510 Encounter for antineoplastic radiation therapy: Secondary | ICD-10-CM | POA: Diagnosis not present

## 2013-05-07 DIAGNOSIS — R599 Enlarged lymph nodes, unspecified: Secondary | ICD-10-CM | POA: Diagnosis not present

## 2013-05-07 DIAGNOSIS — C01 Malignant neoplasm of base of tongue: Secondary | ICD-10-CM

## 2013-05-07 DIAGNOSIS — B977 Papillomavirus as the cause of diseases classified elsewhere: Secondary | ICD-10-CM | POA: Diagnosis not present

## 2013-05-07 MED ORDER — SODIUM CHLORIDE 0.9 % IJ SOLN
10.0000 mL | INTRAMUSCULAR | Status: DC | PRN
  Administered 2013-05-07: 10 mL
  Filled 2013-05-07: qty 10

## 2013-05-07 MED ORDER — CETUXIMAB CHEMO IV INJECTION 200 MG/100ML
400.0000 mg/m2 | Freq: Once | INTRAVENOUS | Status: AC
  Administered 2013-05-07: 800 mg via INTRAVENOUS
  Filled 2013-05-07: qty 400

## 2013-05-07 MED ORDER — DIPHENHYDRAMINE HCL 50 MG/ML IJ SOLN
50.0000 mg | Freq: Once | INTRAMUSCULAR | Status: AC
  Administered 2013-05-07: 50 mg via INTRAVENOUS

## 2013-05-07 MED ORDER — SODIUM CHLORIDE 0.9 % IV SOLN
Freq: Once | INTRAVENOUS | Status: AC
  Administered 2013-05-07: 14:00:00 via INTRAVENOUS

## 2013-05-07 MED ORDER — DIPHENHYDRAMINE HCL 50 MG/ML IJ SOLN
INTRAMUSCULAR | Status: AC
  Filled 2013-05-07: qty 1

## 2013-05-07 MED ORDER — HEPARIN SOD (PORK) LOCK FLUSH 100 UNIT/ML IV SOLN
500.0000 [IU] | Freq: Once | INTRAVENOUS | Status: AC | PRN
  Administered 2013-05-07: 500 [IU]
  Filled 2013-05-07: qty 5

## 2013-05-07 NOTE — Telephone Encounter (Signed)
s.w. pt and advised on 4.1.15 appts....pt ok and aware....will get print out today

## 2013-05-07 NOTE — Progress Notes (Signed)
Chart note: The patient began his chemotherapy today in the management of his carcinoma the base of tongue metastatic to the neck. He is being treated with 2.51 cm dynamic field widths corresponding to one set of IMRT treatment devices [phone]).

## 2013-05-07 NOTE — Progress Notes (Addendum)
Met patient and his wife in [facility] lobby prior to his first RT.  Escorted him to [place], discussed procedure.  He verbalized understanding. Later met patient and his wife in Infusion, provided additional education on use of PEG.   Continuing navigation as L1 patient (new patient).  Gayleen Orem, RN, BSN, [facility] Head & Neck Oncology Navigator [phone]

## 2013-05-07 NOTE — Progress Notes (Signed)
Patient observed 1 hour after first time erbitux infusion. No problems or complications noted. Patient discharged home with wife. Cindi Carbon, RN

## 2013-05-07 NOTE — Telephone Encounter (Signed)
S/w Colletta Maryland with [facility] and notified of order for PEG tube teaching.  She will arrange for home care visit to provide teaching on use and care of PEG.

## 2013-05-07 NOTE — Progress Notes (Signed)
IMRT Device Note outpatient  10.0  delivered field widths represent one set of IMRT treatment devices. The code is [phone].  -----------------------------------  Eppie Gibson, MD

## 2013-05-07 NOTE — Patient Instructions (Signed)
[facility] Discharge Instructions for Patients Receiving Chemotherapy  Today you received the following chemotherapy agents Erbitux To help prevent nausea and vomiting after your treatment, we encourage you to take your nausea medication as prescribed.  If you develop nausea and vomiting that is not controlled by your nausea medication, call the clinic.   BELOW ARE SYMPTOMS THAT SHOULD BE REPORTED IMMEDIATELY:  *FEVER GREATER THAN 100.5 F  *CHILLS WITH OR WITHOUT FEVER  NAUSEA AND VOMITING THAT IS NOT CONTROLLED WITH YOUR NAUSEA MEDICATION  *UNUSUAL SHORTNESS OF BREATH  *UNUSUAL BRUISING OR BLEEDING  TENDERNESS IN MOUTH AND THROAT WITH OR WITHOUT PRESENCE OF ULCERS  *URINARY PROBLEMS  *BOWEL PROBLEMS  UNUSUAL RASH Items with * indicate a potential emergency and should be followed up as soon as possible.  Feel free to call the clinic you have any questions or concerns. The clinic phone number is [phone].    Cetuximab injection (Erbitux) What is this medicine? CETUXIMAB (se TUX i mab) is a chemotherapy drug. It targets a specific protein within cancer cells and stops the cells from growing. It is used to treat colorectal cancer and head and neck cancer. This medicine may be used for other purposes; ask your health care provider or pharmacist if you have questions. COMMON BRAND NAME(S): Erbitux What should I tell my health care provider before I take this medicine? They need to know if you have any of these conditions: -heart disease -history of irregular heartbeat -history of low levels of calcium, magnesium, or potassium in the blood -lung or breathing disease, like asthma -an unusual or allergic reaction to cetuximab, other medicines, foods, dyes, or preservatives -pregnant or trying to get pregnant -breast-feeding How should I use this medicine? This drug is given as an infusion into a vein. It is administered in a hospital or clinic by a  specially trained health care professional. Talk to your pediatrician regarding the use of this medicine in children. Special care may be needed. Overdosage: If you think you have taken too much of this medicine contact a poison control center or emergency room at once. NOTE: This medicine is only for you. Do not share this medicine with others. What if I miss a dose? It is important not to miss your dose. Call your doctor or health care professional if you are unable to keep an appointment. What may interact with this medicine? Interactions are not expected. This list may not describe all possible interactions. Give your health care provider a list of all the medicines, herbs, non-prescription drugs, or dietary supplements you use. Also tell them if you smoke, drink alcohol, or use illegal drugs. Some items may interact with your medicine. What should I watch for while using this medicine? Visit your doctor or health care professional for regular checks on your progress. This drug may make you feel generally unwell. This is not uncommon, as chemotherapy can affect healthy cells as well as cancer cells. Report any side effects. Continue your course of treatment even though you feel ill unless your doctor tells you to stop. This medicine can make you more sensitive to the sun. Keep out of the sun while taking this medicine and for 2 months after the last dose. If you cannot avoid being in the sun, wear protective clothing and use sunscreen. Do not use sun lamps or tanning beds/booths. You may need blood work done while you are taking this medicine. In some cases, you may be given additional medicines to  help with side effects. Follow all directions for their use. Call your doctor or health care professional for advice if you get a fever, chills or sore throat, or other symptoms of a cold or flu. Do not treat yourself. This drug decreases your body's ability to fight infections. Try to avoid being around  people who are sick. Avoid taking products that contain aspirin, acetaminophen, ibuprofen, naproxen, or ketoprofen unless instructed by your doctor. These medicines may hide a fever. Do not become pregnant while taking this medicine. Women should inform their doctor if they wish to become pregnant or think they might be pregnant. There is a potential for serious side effects to an unborn child. Use adequate birth control methods. Avoid pregnancy for at least 6 months after your last dose. Talk to your health care professional or pharmacist for more information. Do not breast-feed an infant while taking this medicine or during the 2 months after your last dose. What side effects may I notice from receiving this medicine? Side effects that you should report to your doctor or health care professional as soon as possible: -allergic reactions like skin rash, itching or hives, swelling of the face, lips, or tongue -breathing problems -changes in vision -fast, irregular heartbeat -feeling faint or lightheaded, falls -fever, chills -mouth sores -trouble passing urine or change in the amount of urine -unusually weak or tired Side effects that usually do not require medical attention (report to your doctor or health care professional if they continue or are bothersome): -changes in skin like acne, cracks, skin dryness -constipation -diarrhea -headache -nail changes -nausea, vomiting -stomach upset -weight loss This list may not describe all possible side effects. Call your doctor for medical advice about side effects. You may report side effects to FDA at 1-800-FDA-1088. Where should I keep my medicine? This drug is given in a hospital or clinic and will not be stored at home. NOTE: This sheet is a summary. It may not cover all possible information. If you have questions about this medicine, talk to your doctor, pharmacist, or health care provider.  2014, Elsevier/Gold Standard. (2009-12-21  14:01:41)

## 2013-05-08 ENCOUNTER — Telehealth: Payer: Self-pay | Admitting: *Deleted

## 2013-05-08 ENCOUNTER — Ambulatory Visit
Admission: RE | Admit: 2013-05-08 | Discharge: 2013-05-08 | Disposition: A | Discharge status: Home / Self Care | Payer: Medicare Other | Source: Ambulatory Visit | Attending: Radiation Oncology | Admitting: Radiation Oncology

## 2013-05-08 DIAGNOSIS — C01 Malignant neoplasm of base of tongue: Secondary | ICD-10-CM | POA: Diagnosis not present

## 2013-05-08 DIAGNOSIS — B977 Papillomavirus as the cause of diseases classified elsewhere: Secondary | ICD-10-CM | POA: Diagnosis not present

## 2013-05-08 DIAGNOSIS — R21 Rash and other nonspecific skin eruption: Secondary | ICD-10-CM | POA: Diagnosis not present

## 2013-05-08 DIAGNOSIS — R599 Enlarged lymph nodes, unspecified: Secondary | ICD-10-CM | POA: Diagnosis not present

## 2013-05-08 DIAGNOSIS — Z510 Encounter for antineoplastic radiation therapy: Secondary | ICD-10-CM | POA: Diagnosis not present

## 2013-05-08 DIAGNOSIS — R799 Abnormal finding of blood chemistry, unspecified: Secondary | ICD-10-CM | POA: Diagnosis not present

## 2013-05-08 NOTE — Telephone Encounter (Signed)
Message copied by Cherylynn Ridges on Thu May 08, 2013 12:52 PM ------      Message from: Perlie Gold      Created: Wed May 07, 2013  3:46 PM      Regarding: Chemo follow up call      Contact: [phone]       First time Erbitux. Dr. Alvy Bimler patient. Please call.             Thanks,      Barnetta Chapel, RN ------

## 2013-05-08 NOTE — Telephone Encounter (Signed)
Called Brent Franklin for chemotherapy F/U.  Patient is doing well.  Denies n/v.  Denies any new side effects or symptoms.  "The only side effects I had are from the benadryl."  Bowel and bladder is functioning well.  Eating and drinking well and I instructed to drink 64 oz minimum daily or at least the day before, of and after treatment.  Preparing to come to RT treatment.  Denies questions at this time and encouraged to call if needed.  Reviewed how to call after hours in the case of an emergency.

## 2013-05-09 ENCOUNTER — Ambulatory Visit
Admission: RE | Admit: 2013-05-09 | Discharge: 2013-05-09 | Disposition: A | Discharge status: Home / Self Care | Payer: Medicare Other | Source: Ambulatory Visit | Attending: Radiation Oncology | Admitting: Radiation Oncology

## 2013-05-09 DIAGNOSIS — B977 Papillomavirus as the cause of diseases classified elsewhere: Secondary | ICD-10-CM | POA: Diagnosis not present

## 2013-05-09 DIAGNOSIS — Z431 Encounter for attention to gastrostomy: Secondary | ICD-10-CM | POA: Diagnosis not present

## 2013-05-09 DIAGNOSIS — C01 Malignant neoplasm of base of tongue: Secondary | ICD-10-CM | POA: Diagnosis not present

## 2013-05-09 DIAGNOSIS — R634 Abnormal weight loss: Secondary | ICD-10-CM | POA: Diagnosis not present

## 2013-05-09 DIAGNOSIS — I10 Essential (primary) hypertension: Secondary | ICD-10-CM | POA: Diagnosis not present

## 2013-05-09 DIAGNOSIS — R599 Enlarged lymph nodes, unspecified: Secondary | ICD-10-CM | POA: Diagnosis not present

## 2013-05-09 DIAGNOSIS — R1310 Dysphagia, unspecified: Secondary | ICD-10-CM | POA: Diagnosis not present

## 2013-05-09 DIAGNOSIS — R21 Rash and other nonspecific skin eruption: Secondary | ICD-10-CM | POA: Diagnosis not present

## 2013-05-09 DIAGNOSIS — Z510 Encounter for antineoplastic radiation therapy: Secondary | ICD-10-CM | POA: Diagnosis not present

## 2013-05-09 DIAGNOSIS — R799 Abnormal finding of blood chemistry, unspecified: Secondary | ICD-10-CM | POA: Diagnosis not present

## 2013-05-09 DIAGNOSIS — E119 Type 2 diabetes mellitus without complications: Secondary | ICD-10-CM | POA: Diagnosis not present

## 2013-05-09 NOTE — Progress Notes (Signed)
Brent Franklin given the Radiation Therapy and You booklet today with the appropriate pages marked regarding management of fatigue, pain in mouth/throat with changes in diet textures and temperature of food and drinks, skin care, shaving with electric razor.  He asked many questions about the energies emitted from the Tomotherapy treatment machine and had 2 physics staff members talk with him. He stated he would read the Radiation Therapy and You booklet and "get back with this RN." for any further questions.

## 2013-05-12 ENCOUNTER — Ambulatory Visit
Admission: RE | Admit: 2013-05-12 | Discharge: 2013-05-12 | Disposition: A | Discharge status: Home / Self Care | Payer: Medicare Other | Source: Ambulatory Visit | Attending: Radiation Oncology | Admitting: Radiation Oncology

## 2013-05-12 ENCOUNTER — Inpatient Hospital Stay
Admission: RE | Admit: 2013-05-12 | Discharge: 2013-05-12 | Disposition: A | Discharge status: Home / Self Care | Payer: Medicare Other | Source: Ambulatory Visit | Attending: Radiation Oncology | Admitting: Radiation Oncology

## 2013-05-12 ENCOUNTER — Encounter: Payer: Self-pay | Admitting: Radiation Oncology

## 2013-05-12 VITALS — BP 132/65 | HR 81 | Temp 97.5°F | Ht 74.0 in | Wt 175.8 lb

## 2013-05-12 DIAGNOSIS — R799 Abnormal finding of blood chemistry, unspecified: Secondary | ICD-10-CM | POA: Diagnosis not present

## 2013-05-12 DIAGNOSIS — R21 Rash and other nonspecific skin eruption: Secondary | ICD-10-CM | POA: Diagnosis not present

## 2013-05-12 DIAGNOSIS — B977 Papillomavirus as the cause of diseases classified elsewhere: Secondary | ICD-10-CM | POA: Diagnosis not present

## 2013-05-12 DIAGNOSIS — Z510 Encounter for antineoplastic radiation therapy: Secondary | ICD-10-CM | POA: Diagnosis not present

## 2013-05-12 DIAGNOSIS — C01 Malignant neoplasm of base of tongue: Secondary | ICD-10-CM

## 2013-05-12 DIAGNOSIS — R599 Enlarged lymph nodes, unspecified: Secondary | ICD-10-CM | POA: Diagnosis not present

## 2013-05-12 NOTE — Progress Notes (Addendum)
   Weekly Management Note:  outpatient Current Dose:  8 Gy  Projected Dose: 70 Gy   Narrative:  The patient presents for routine under treatment assessment.  CBCT/MVCT images/Port film x-rays were reviewed.  The chart was checked. Erbitux rash has developed over face/neck.  Sore sore throat, but not bad enough to use narcotics.  Using PEG and swallowing as well.  Physical Findings:  height is 6\' 2"  (1.88 m) and weight is 175 lb 12.8 oz (79.742 kg). His temperature is 97.5 F (36.4 C). His blood pressure is 132/65 and his pulse is 81.  NAD, erythematous erbitux rash over face but no pustules.  Non toxic appearing.  CBC    Component Value Date/Time   WBC 6.4 04/29/2013 0844   WBC 9.2 03/02/2013 1528   RBC 4.72 04/29/2013 0844   RBC 5.01 03/02/2013 1528   HGB 13.7 04/29/2013 0844   HGB 14.4 03/02/2013 1528   HCT 40.7 04/29/2013 0844   HCT 46.8 03/02/2013 1528   PLT 183 04/29/2013 0844   MCV 86.2 04/29/2013 0844   MCV 93.5 03/02/2013 1528   MCH 29.0 04/29/2013 0844   MCH 28.7 03/02/2013 1528   MCHC 33.7 04/29/2013 0844   MCHC 30.8* 03/02/2013 1528   RDW 14.2 04/29/2013 0844   LYMPHSABS 1.0 04/29/2013 0844   MONOABS 0.6 04/29/2013 0844   EOSABS 0.3 04/29/2013 0844   BASOSABS 0.0 04/29/2013 0844     CMP     Component Value Date/Time   NA 143 04/29/2013 0844   K 4.3 04/29/2013 0844   CL 103 04/29/2013 0844   CO2 29 04/29/2013 0844   GLUCOSE 106* 04/29/2013 0844   BUN 21.2 04/30/2013 1418   BUN 21 04/29/2013 0844   CREATININE 1.0 04/30/2013 1418   CREATININE 0.98 04/29/2013 0844   CALCIUM 9.4 04/29/2013 0844   PROT 6.7 04/29/2013 0844   ALBUMIN 3.8 04/29/2013 0844   AST 25 04/29/2013 0844   ALT 28 04/29/2013 0844   ALKPHOS 103 04/29/2013 0844   BILITOT 0.6 04/29/2013 0844   GFRNONAA 76* 04/29/2013 0844   GFRAA 88* 04/29/2013 0844     Impression:  The patient is tolerating radiotherapy.   Plan:  Continue radiotherapy as planned. Rx'd MMW in case it is needed this week for odynophagia.  EPIC WOULD  NOT ALLOW ORDERS to be made for RX - therefore it was handwritten.  -----------------------------------  Eppie Gibson, MD

## 2013-05-12 NOTE — Progress Notes (Signed)
Brent Franklin has received 4 fractions to his BOT/Bilateral neck,  He now has a diffuse erythema and rash on his face and is currently on Erbitux.  He is applying Biafine to his face and describes a burning/stinging sensation as a level .  He reports "some sore throat and reports he has no interest  In eating.  He is eating, in  but small amounts and is instilling enteral feeds vis his PEG tube.

## 2013-05-13 ENCOUNTER — Ambulatory Visit
Admission: RE | Admit: 2013-05-13 | Discharge: 2013-05-13 | Disposition: A | Discharge status: Home / Self Care | Payer: Medicare Other | Source: Ambulatory Visit | Attending: Radiation Oncology | Admitting: Radiation Oncology

## 2013-05-13 DIAGNOSIS — R799 Abnormal finding of blood chemistry, unspecified: Secondary | ICD-10-CM | POA: Diagnosis not present

## 2013-05-13 DIAGNOSIS — R599 Enlarged lymph nodes, unspecified: Secondary | ICD-10-CM | POA: Diagnosis not present

## 2013-05-13 DIAGNOSIS — Z510 Encounter for antineoplastic radiation therapy: Secondary | ICD-10-CM | POA: Diagnosis not present

## 2013-05-13 DIAGNOSIS — B977 Papillomavirus as the cause of diseases classified elsewhere: Secondary | ICD-10-CM | POA: Diagnosis not present

## 2013-05-13 DIAGNOSIS — R21 Rash and other nonspecific skin eruption: Secondary | ICD-10-CM | POA: Diagnosis not present

## 2013-05-13 DIAGNOSIS — C01 Malignant neoplasm of base of tongue: Secondary | ICD-10-CM | POA: Diagnosis not present

## 2013-05-14 ENCOUNTER — Ambulatory Visit (HOSPITAL_BASED_OUTPATIENT_CLINIC_OR_DEPARTMENT_OTHER): Payer: Medicare Other | Admitting: Hematology and Oncology

## 2013-05-14 ENCOUNTER — Other Ambulatory Visit (HOSPITAL_BASED_OUTPATIENT_CLINIC_OR_DEPARTMENT_OTHER): Payer: Medicare Other

## 2013-05-14 ENCOUNTER — Ambulatory Visit
Admission: RE | Admit: 2013-05-14 | Discharge: 2013-05-14 | Disposition: A | Discharge status: Home / Self Care | Payer: Medicare Other | Source: Ambulatory Visit | Attending: Radiation Oncology | Admitting: Radiation Oncology

## 2013-05-14 ENCOUNTER — Ambulatory Visit (HOSPITAL_BASED_OUTPATIENT_CLINIC_OR_DEPARTMENT_OTHER): Payer: Medicare Other

## 2013-05-14 ENCOUNTER — Encounter: Payer: Self-pay | Admitting: Hematology and Oncology

## 2013-05-14 ENCOUNTER — Ambulatory Visit: Payer: Medicare Other | Admitting: Nutrition

## 2013-05-14 VITALS — BP 132/63 | HR 70 | Temp 97.3°F | Resp 20 | Ht 74.0 in | Wt 174.0 lb

## 2013-05-14 DIAGNOSIS — C770 Secondary and unspecified malignant neoplasm of lymph nodes of head, face and neck: Secondary | ICD-10-CM | POA: Diagnosis not present

## 2013-05-14 DIAGNOSIS — C01 Malignant neoplasm of base of tongue: Secondary | ICD-10-CM

## 2013-05-14 DIAGNOSIS — G4700 Insomnia, unspecified: Secondary | ICD-10-CM

## 2013-05-14 DIAGNOSIS — R599 Enlarged lymph nodes, unspecified: Secondary | ICD-10-CM | POA: Diagnosis not present

## 2013-05-14 DIAGNOSIS — R21 Rash and other nonspecific skin eruption: Secondary | ICD-10-CM | POA: Diagnosis not present

## 2013-05-14 DIAGNOSIS — Z5112 Encounter for antineoplastic immunotherapy: Secondary | ICD-10-CM | POA: Diagnosis not present

## 2013-05-14 DIAGNOSIS — R799 Abnormal finding of blood chemistry, unspecified: Secondary | ICD-10-CM | POA: Diagnosis not present

## 2013-05-14 DIAGNOSIS — R634 Abnormal weight loss: Secondary | ICD-10-CM

## 2013-05-14 DIAGNOSIS — R5383 Other fatigue: Secondary | ICD-10-CM

## 2013-05-14 DIAGNOSIS — R5381 Other malaise: Secondary | ICD-10-CM

## 2013-05-14 DIAGNOSIS — R630 Anorexia: Secondary | ICD-10-CM | POA: Diagnosis not present

## 2013-05-14 DIAGNOSIS — Z510 Encounter for antineoplastic radiation therapy: Secondary | ICD-10-CM | POA: Diagnosis not present

## 2013-05-14 DIAGNOSIS — B977 Papillomavirus as the cause of diseases classified elsewhere: Secondary | ICD-10-CM | POA: Diagnosis not present

## 2013-05-14 HISTORY — DX: Rash and other nonspecific skin eruption: R21

## 2013-05-14 LAB — BASIC METABOLIC PANEL (CC13)
ANION GAP: 9 meq/L (ref 3–11)
BUN: 24.6 mg/dL (ref 7.0–26.0)
CHLORIDE: 103 meq/L (ref 98–109)
CO2: 29 mEq/L (ref 22–29)
Calcium: 9 mg/dL (ref 8.4–10.4)
Creatinine: 1 mg/dL (ref 0.7–1.3)
Glucose: 173 mg/dl — ABNORMAL HIGH (ref 70–140)
POTASSIUM: 3.8 meq/L (ref 3.5–5.1)
SODIUM: 141 meq/L (ref 136–145)

## 2013-05-14 LAB — CBC WITH DIFFERENTIAL/PLATELET
BASO%: 0.2 % (ref 0.0–2.0)
Basophils Absolute: 0 10*3/uL (ref 0.0–0.1)
EOS ABS: 0.3 10*3/uL (ref 0.0–0.5)
EOS%: 3.5 % (ref 0.0–7.0)
HEMATOCRIT: 39.8 % (ref 38.4–49.9)
HEMOGLOBIN: 13 g/dL (ref 13.0–17.1)
LYMPH#: 0.7 10*3/uL — AB (ref 0.9–3.3)
LYMPH%: 9.9 % — ABNORMAL LOW (ref 14.0–49.0)
MCH: 28.2 pg (ref 27.2–33.4)
MCHC: 32.6 g/dL (ref 32.0–36.0)
MCV: 86.6 fL (ref 79.3–98.0)
MONO#: 0.7 10*3/uL (ref 0.1–0.9)
MONO%: 9 % (ref 0.0–14.0)
NEUT#: 5.6 10*3/uL (ref 1.5–6.5)
NEUT%: 77.4 % — AB (ref 39.0–75.0)
Platelets: 209 10*3/uL (ref 140–400)
RBC: 4.6 10*6/uL (ref 4.20–5.82)
RDW: 14.4 % (ref 11.0–14.6)
WBC: 7.2 10*3/uL (ref 4.0–10.3)

## 2013-05-14 LAB — MAGNESIUM (CC13): Magnesium: 1.7 mg/dl (ref 1.5–2.5)

## 2013-05-14 MED ORDER — CLINDAMYCIN PHOSPHATE 1 % EX GEL
Freq: Two times a day (BID) | CUTANEOUS | Status: DC
Start: 2013-05-14 — End: 2013-06-03

## 2013-05-14 MED ORDER — CETUXIMAB CHEMO IV INJECTION 200 MG/100ML
250.0000 mg/m2 | Freq: Once | INTRAVENOUS | Status: AC
  Administered 2013-05-14: 500 mg via INTRAVENOUS
  Filled 2013-05-14: qty 250

## 2013-05-14 MED ORDER — SODIUM CHLORIDE 0.9 % IJ SOLN
10.0000 mL | INTRAMUSCULAR | Status: DC | PRN
  Administered 2013-05-14: 10 mL
  Filled 2013-05-14: qty 10

## 2013-05-14 MED ORDER — MINOCYCLINE HCL 100 MG PO CAPS: 100.0000 mg | ORAL_CAPSULE | Freq: Two times a day (BID) | ORAL | Status: DC

## 2013-05-14 MED ORDER — HEPARIN SOD (PORK) LOCK FLUSH 100 UNIT/ML IV SOLN
500.0000 [IU] | Freq: Once | INTRAVENOUS | Status: AC | PRN
  Administered 2013-05-14: 500 [IU]
  Filled 2013-05-14: qty 5

## 2013-05-14 MED ORDER — SODIUM CHLORIDE 0.9 % IV SOLN
Freq: Once | INTRAVENOUS | Status: AC
  Administered 2013-05-14: 13:00:00 via INTRAVENOUS

## 2013-05-14 MED ORDER — DIPHENHYDRAMINE HCL 50 MG/ML IJ SOLN
INTRAMUSCULAR | Status: AC
  Filled 2013-05-14: qty 1

## 2013-05-14 MED ORDER — OSMOLITE 1.5 CAL PO LIQD: ORAL | Status: DC

## 2013-05-14 MED ORDER — DIPHENHYDRAMINE HCL 50 MG/ML IJ SOLN
25.0000 mg | Freq: Once | INTRAMUSCULAR | Status: AC
  Administered 2013-05-14: 25 mg via INTRAVENOUS

## 2013-05-14 NOTE — Progress Notes (Signed)
Patient sleeping soundly in chemotherapy area after receiving Benadryl.  Spoke with patient's wife, who reports patient is eating less solid food.  He refuses to drink Ensure Plus but has used Ensure Plus through his feeding tube.  Weight was stable at 175.8 pounds on March 30.  Patient's wife requesting tube feeding orders be placed.  Estimated nutrition needs: 2100-2350 calories, 110-125 g protein, 2.4 L fluid.  Nutrition diagnosis: Predicted suboptimal energy intake has evolved into inadequate oral intake related to tongue cancer as evidenced by patient requiring feeding tube to meet greater than 90% estimated nutrition needs.  Intervention: Educated patient's wife to begin Osmolite 1.5, one can 4 times a day with 120 mL free water before and after each bolus feeding.  Patient to advance tube feedings to 1-1/2 cans 4 times a day to provide 2130 calories, 89 g protein, 2046 mL free water.  Patient should continue higher protein foods and water as tolerated.  Written fact sheets provided.  Teach back method used.  [facility] notified.  Monitoring, evaluation, goals: Patient will tolerate oral intake plus tube feedings to meet greater than 90% of estimated nutrition needs and promote weight stabilization.  Next visit: I will followup with patient as needed to monitor tube feeding tolerance.  Patient has my phone number for questions.

## 2013-05-14 NOTE — Progress Notes (Signed)
[facility] OFFICE PROGRESS NOTE  Patient Care Team: Tivis Ringer, MD as PCP - General (Internal Medicine) Brooks Sailors, RN as Registered Nurse (Oncology) Heath Lark, MD as Consulting Physician (Hematology and Oncology)  DIAGNOSIS: Base of tongue cancer, on going chemotherapy and radiation therapy  SUMMARY OF ONCOLOGIC HISTORY: Oncology History   Base of tongue cancer, HPV pending   Primary site: Lip and Oral Cavity (Bilateral)   Staging method: AJCC 7th Edition   Clinical: Stage IVA (T2, N2c, M0) signed by Heath Lark, MD on 04/18/2013  2:21 PM   Summary: Stage IVA (T2, N2c, M0)       Malignant neoplasm of base of tongue   04/01/2013 Imaging Ct scan of neck showed base of tongue cancer involving vallecula and bilateral LN metastasis   04/15/2013 Surgery Laryngoscopy and biopsy 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   05/05/2013 Procedure The patient had placement of port and feeding tube.   05/07/2013 -  Radiation Therapy The patient received radiation therapy   05/07/2013 -  Chemotherapy Patient received weekly cetuximab   05/14/2013 Adverse Reaction Grade 1 to 2 skin rash is noted    INTERVAL HISTORY: Brent Franklin 80 y.o. male returns for follow-up prior to week 2 therapy. He developed significant skin rash on his face, upper extremities and trunk. The rash caused some itchiness. The patient denies any mouth sores, nausea, vomiting or change in bowel habits. He complained of excessive sedation with IV Benadryl before his treatment. He complained of poor appetite and he had lost 4 pounds of weight. He uses about 4 cans of nutritional supplements daily  I have reviewed the past medical history, past surgical history, social history and family history  with the patient and they are unchanged from previous note.  ALLERGIES:  has No Known Allergies.  MEDICATIONS:  Current Outpatient Prescriptions  Medication Sig Dispense Refill  . aspirin 81 MG tablet Take 81 mg by mouth daily.      Marland Kitchen atorvastatin (LIPITOR) 20 MG tablet Take 20 mg by mouth daily.      Marland Kitchen glipiZIDE (GLUCOTROL XL) 5 MG 24 hr tablet Take 5 mg by mouth daily.      Marland Kitchen HYDROcodone-acetaminophen (NORCO/VICODIN) 5-325 MG per tablet Take 1-2 tablets by mouth every 4 (four) hours as needed.  40 tablet  0  . ibuprofen (ADVIL,MOTRIN) 200 MG tablet Take 400 mg by mouth every 6 (six) hours as needed (pain).       Marland Kitchen lidocaine-prilocaine (EMLA) cream Apply 1 application topically as needed.  30 g  0  . metFORMIN (GLUCOPHAGE) 850 MG tablet Take 850 mg by mouth 2 (two) times daily with a meal.      . Multiple Vitamins-Minerals (PRESERVISION AREDS 2) CAPS Take 1 capsule by mouth 2 (two) times daily.      . ondansetron (ZOFRAN) 8 MG tablet Take 1 tablet (8 mg total) by mouth every 8 (eight) hours as needed for nausea.  30 tablet  3  . promethazine (PHENERGAN) 25 MG tablet Take 1 tablet (25 mg total) by mouth every 6 (six) hours as needed for nausea or vomiting.  30 tablet  3  . sodium fluoride (FLUORISHIELD) 1.1 % GEL dental gel Place 1 application onto teeth at bedtime.      . valsartan-hydrochlorothiazide (DIOVAN-HCT)  320-12.5 MG per tablet Take 1 tablet by mouth daily.       Marland Kitchen zolpidem (AMBIEN) 10 MG tablet Take 5 mg by mouth at bedtime as needed for sleep.      . clindamycin (CLINDAGEL) 1 % gel Apply topically 2 (two) times daily.  30 g  0  . minocycline (MINOCIN) 100 MG capsule Take 1 capsule (100 mg total) by mouth 2 (two) times daily.  60 capsule  0  . Nutritional Supplements (FEEDING SUPPLEMENT, OSMOLITE 1.5 CAL,) LIQD Begin one can of Osmolite 1.5 four times a day with 120 mL free water before and after each bolus.  Increase bolus feeds to 1-1/2 cans Osmolite 1.5 - four times a day. Please  send formula and supplies.  1422 mL  0   No current facility-administered medications for this visit.    REVIEW OF SYSTEMS:   Constitutional: Denies fevers, chills  Eyes: Denies blurriness of vision Ears, nose, mouth, throat, and face: Denies mucositis or sore throat Respiratory: Denies cough, dyspnea or wheezes Cardiovascular: Denies palpitation, chest discomfort or lower extremity swelling Gastrointestinal:  Denies nausea, heartburn or change in bowel habits Lymphatics: Denies new lymphadenopathy or easy bruising Neurological:Denies numbness, tingling or new weaknesses Behavioral/Psych: Mood is stable, no new changes  All other systems were reviewed with the patient and are negative.  PHYSICAL EXAMINATION: ECOG PERFORMANCE STATUS: 1 - Symptomatic but completely ambulatory  Filed Vitals:   05/14/13 1157  BP: 132/63  Pulse: 70  Temp: 97.3 F (36.3 C)  Resp: 20   Filed Weights   05/14/13 1157  Weight: 174 lb (78.926 kg)    GENERAL:alert, no distress and comfortable SKIN: grade 1 to 2 acneform rash due to Erbitux. No ulceration, <50% body surface EYES: normal, Conjunctiva are pink and non-injected, sclera clear OROPHARYNX:no exudate, no erythema and lips, buccal mucosa, and tongue normal  NECK: supple, thyroid normal size, non-tender, without nodularity LYMPH:  palpable lymphadenopathy in the cervical nodes, smaller in size, none in the axillary or inguinal areas LUNGS: clear to auscultation and percussion with normal breathing effort HEART: regular rate & rhythm and no murmurs and no lower extremity edema ABDOMEN:abdomen soft, non-tender and normal bowel sounds. Feeding tube site looks ok Musculoskeletal:no cyanosis of digits and no clubbing  NEURO: alert & oriented x 3 with fluent speech, no focal motor/sensory deficits  LABORATORY DATA:  I have reviewed the data as listed    Component Value Date/Time   NA 141 05/14/2013 1152   NA 143 04/29/2013 0844   K 3.8 05/14/2013  1152   K 4.3 04/29/2013 0844   CL 103 04/29/2013 0844   CO2 29 05/14/2013 1152   CO2 29 04/29/2013 0844   GLUCOSE 173* 05/14/2013 1152   GLUCOSE 106* 04/29/2013 0844   BUN 24.6 05/14/2013 1152   BUN 21 04/29/2013 0844   CREATININE 1.0 05/14/2013 1152   CREATININE 0.98 04/29/2013 0844   CALCIUM 9.0 05/14/2013 1152   CALCIUM 9.4 04/29/2013 0844   PROT 6.7 04/29/2013 0844   ALBUMIN 3.8 04/29/2013 0844   AST 25 04/29/2013 0844   ALT 28 04/29/2013 0844   ALKPHOS 103 04/29/2013 0844   BILITOT 0.6 04/29/2013 0844   GFRNONAA 76* 04/29/2013 0844   GFRAA 88* 04/29/2013 0844    No results found for this basename: SPEP, UPEP,  kappa and lambda light chains    Lab Results  Component Value Date   WBC 7.2 05/14/2013   NEUTROABS 5.6 05/14/2013   HGB 13.0 05/14/2013  HCT 39.8 05/14/2013   MCV 86.6 05/14/2013   PLT 209 05/14/2013      Chemistry      Component Value Date/Time   NA 141 05/14/2013 1152   NA 143 04/29/2013 0844   K 3.8 05/14/2013 1152   K 4.3 04/29/2013 0844   CL 103 04/29/2013 0844   CO2 29 05/14/2013 1152   CO2 29 04/29/2013 0844   BUN 24.6 05/14/2013 1152   BUN 21 04/29/2013 0844   CREATININE 1.0 05/14/2013 1152   CREATININE 0.98 04/29/2013 0844      Component Value Date/Time   CALCIUM 9.0 05/14/2013 1152   CALCIUM 9.4 04/29/2013 0844   ALKPHOS 103 04/29/2013 0844   AST 25 04/29/2013 0844   ALT 28 04/29/2013 0844   BILITOT 0.6 04/29/2013 0844     ASSESSMENT & PLAN:  #1 Tongue cancer He is experiencing early side effects from treatment. Continue supportive care #2 Skin rash This is due to side-effects of Cetuximab. I recommend Benadyl prn for itching and hydrocortisone cream. If it gets worse, he will start minocycline and topical antibiotic cream #3 Anorexia with mild weight loss Continue nutritional support through feeding tube #4 Fatigue Likely side-effects from treatment #5 Excessive sedation I will modify his premed by reducing the dose of Benadryl   All questions were answered. The patient knows to  call the clinic with any problems, questions or concerns. No barriers to learning was detected. I spent 40 minutes counseling the patient face to face. The total time spent in the appointment was 55 minutes and more than 50% was on counseling and review of test results     [facility], [place], MD 05/14/2013 8:49 PM

## 2013-05-15 ENCOUNTER — Ambulatory Visit
Admission: RE | Admit: 2013-05-15 | Discharge: 2013-05-15 | Disposition: A | Discharge status: Home / Self Care | Payer: Medicare Other | Source: Ambulatory Visit | Attending: Radiation Oncology | Admitting: Radiation Oncology

## 2013-05-15 ENCOUNTER — Telehealth: Payer: Self-pay | Admitting: Hematology and Oncology

## 2013-05-15 DIAGNOSIS — C01 Malignant neoplasm of base of tongue: Secondary | ICD-10-CM | POA: Diagnosis not present

## 2013-05-15 DIAGNOSIS — R799 Abnormal finding of blood chemistry, unspecified: Secondary | ICD-10-CM | POA: Diagnosis not present

## 2013-05-15 DIAGNOSIS — E119 Type 2 diabetes mellitus without complications: Secondary | ICD-10-CM | POA: Diagnosis not present

## 2013-05-15 DIAGNOSIS — R21 Rash and other nonspecific skin eruption: Secondary | ICD-10-CM | POA: Diagnosis not present

## 2013-05-15 DIAGNOSIS — R1310 Dysphagia, unspecified: Secondary | ICD-10-CM | POA: Diagnosis not present

## 2013-05-15 DIAGNOSIS — R599 Enlarged lymph nodes, unspecified: Secondary | ICD-10-CM | POA: Diagnosis not present

## 2013-05-15 DIAGNOSIS — Z510 Encounter for antineoplastic radiation therapy: Secondary | ICD-10-CM | POA: Diagnosis not present

## 2013-05-15 DIAGNOSIS — Z431 Encounter for attention to gastrostomy: Secondary | ICD-10-CM | POA: Diagnosis not present

## 2013-05-15 DIAGNOSIS — B977 Papillomavirus as the cause of diseases classified elsewhere: Secondary | ICD-10-CM | POA: Diagnosis not present

## 2013-05-15 DIAGNOSIS — R634 Abnormal weight loss: Secondary | ICD-10-CM | POA: Diagnosis not present

## 2013-05-15 NOTE — Telephone Encounter (Signed)
S/W PT WIFE RE NEXT APPT FOR 4/7. PT TO GET NEW SCHEDULE WHEN HE COMES IN 4/7. PER NG LB/FU 4/7 AND TX 4/8.

## 2013-05-16 ENCOUNTER — Ambulatory Visit
Admission: RE | Admit: 2013-05-16 | Discharge: 2013-05-16 | Disposition: A | Discharge status: Home / Self Care | Payer: Medicare Other | Source: Ambulatory Visit | Attending: Radiation Oncology | Admitting: Radiation Oncology

## 2013-05-16 ENCOUNTER — Other Ambulatory Visit: Payer: Self-pay | Admitting: *Deleted

## 2013-05-16 DIAGNOSIS — R21 Rash and other nonspecific skin eruption: Secondary | ICD-10-CM | POA: Diagnosis not present

## 2013-05-16 DIAGNOSIS — R799 Abnormal finding of blood chemistry, unspecified: Secondary | ICD-10-CM | POA: Diagnosis not present

## 2013-05-16 DIAGNOSIS — C01 Malignant neoplasm of base of tongue: Secondary | ICD-10-CM | POA: Diagnosis not present

## 2013-05-16 DIAGNOSIS — B977 Papillomavirus as the cause of diseases classified elsewhere: Secondary | ICD-10-CM | POA: Diagnosis not present

## 2013-05-16 DIAGNOSIS — Z510 Encounter for antineoplastic radiation therapy: Secondary | ICD-10-CM | POA: Diagnosis not present

## 2013-05-16 DIAGNOSIS — R599 Enlarged lymph nodes, unspecified: Secondary | ICD-10-CM | POA: Diagnosis not present

## 2013-05-19 ENCOUNTER — Ambulatory Visit
Admission: RE | Admit: 2013-05-19 | Discharge: 2013-05-19 | Disposition: A | Discharge status: Home / Self Care | Payer: Medicare Other | Source: Ambulatory Visit | Attending: Radiation Oncology | Admitting: Radiation Oncology

## 2013-05-19 ENCOUNTER — Encounter: Payer: Self-pay | Admitting: Radiation Oncology

## 2013-05-19 ENCOUNTER — Encounter: Payer: Self-pay | Admitting: Hematology and Oncology

## 2013-05-19 VITALS — BP 101/58 | HR 75 | Temp 98.3°F | Resp 20 | Wt 171.4 lb

## 2013-05-19 DIAGNOSIS — Z510 Encounter for antineoplastic radiation therapy: Secondary | ICD-10-CM | POA: Diagnosis not present

## 2013-05-19 DIAGNOSIS — R634 Abnormal weight loss: Secondary | ICD-10-CM | POA: Diagnosis not present

## 2013-05-19 DIAGNOSIS — C01 Malignant neoplasm of base of tongue: Secondary | ICD-10-CM

## 2013-05-19 DIAGNOSIS — R599 Enlarged lymph nodes, unspecified: Secondary | ICD-10-CM | POA: Diagnosis not present

## 2013-05-19 DIAGNOSIS — B977 Papillomavirus as the cause of diseases classified elsewhere: Secondary | ICD-10-CM | POA: Diagnosis not present

## 2013-05-19 DIAGNOSIS — E119 Type 2 diabetes mellitus without complications: Secondary | ICD-10-CM | POA: Diagnosis not present

## 2013-05-19 DIAGNOSIS — Z431 Encounter for attention to gastrostomy: Secondary | ICD-10-CM | POA: Diagnosis not present

## 2013-05-19 DIAGNOSIS — R1310 Dysphagia, unspecified: Secondary | ICD-10-CM | POA: Diagnosis not present

## 2013-05-19 DIAGNOSIS — R21 Rash and other nonspecific skin eruption: Secondary | ICD-10-CM | POA: Diagnosis not present

## 2013-05-19 DIAGNOSIS — R799 Abnormal finding of blood chemistry, unspecified: Secondary | ICD-10-CM | POA: Diagnosis not present

## 2013-05-19 MED ORDER — BIAFINE EX EMUL
Freq: Two times a day (BID) | CUTANEOUS | Status: DC
  Administered 2013-05-19: 15:00:00 via TOPICAL

## 2013-05-19 NOTE — Progress Notes (Signed)
CC: Dr. Eppie Gibson   Weekly Management Note:  Site: Base of tongue/bilateral neck Current Dose:  1800  cGy Projected Dose: 7000  cGy  Narrative: The patient is seen today for routine under treatment assessment. CBCT/MVCT images/port films were reviewed. The chart was reviewed.   Setup is satisfactory. He does feel fatigued. He is having only minimal discomfort (1/10) on swallowing. He is been taking in 4 cans of nutritional supplementation a day. He is taking in very little by mouth. His weight is down 3-4 pounds over the past week. 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 He is taking sensitizing Erbitux.  Physical Examination:  Filed Vitals:   05/19/13 1443  BP: 101/58  Pulse: 75  Temp: 98.3 F (36.8 C)  Resp: 20  .  Weight: 171 lb 6.4 oz (77.747 kg). He does have an actiniform rash along his face. On inspection oral cavity/oropharynx there is no candidiasis. There has been significant regression of his base of tongue bulky primary. There is still palpable adenopathy bilaterally, but this has regressed as well compared to his pretreatment scans.  Laboratory data: Lab Results  Component Value Date   WBC 7.2 05/14/2013   HGB 13.0 05/14/2013   HCT 39.8 05/14/2013   MCV 86.6 05/14/2013   PLT 209 05/14/2013     Impression: Tolerating radiation therapy well. I encouraged him to increase his caloric intake by taking in 6 cans of nutritional supplementation to his PEG tube a day. He knows to remain hydrated as well.  Plan: Continue radiation therapy as planned.

## 2013-05-19 NOTE — Progress Notes (Signed)
Pt denies pain but states his throat "is uncomfortable". He is hoarse, fatigued, has loss of appetite. Pt taking in 4 cans nutritional supplements per peg tube daily. He only eats cream of wheat in mornings, no other PO intake. Pt applying Biafine to rash on face and face/neck treatment area.

## 2013-05-20 ENCOUNTER — Encounter: Payer: Self-pay | Admitting: *Deleted

## 2013-05-20 ENCOUNTER — Other Ambulatory Visit: Payer: Self-pay | Admitting: Hematology and Oncology

## 2013-05-20 ENCOUNTER — Ambulatory Visit (HOSPITAL_BASED_OUTPATIENT_CLINIC_OR_DEPARTMENT_OTHER): Payer: Medicare Other | Admitting: Hematology and Oncology

## 2013-05-20 ENCOUNTER — Other Ambulatory Visit: Payer: Self-pay | Admitting: Oncology

## 2013-05-20 ENCOUNTER — Telehealth: Payer: Self-pay | Admitting: Hematology and Oncology

## 2013-05-20 ENCOUNTER — Ambulatory Visit
Admission: RE | Admit: 2013-05-20 | Discharge: 2013-05-20 | Disposition: A | Discharge status: Home / Self Care | Payer: Medicare Other | Source: Ambulatory Visit | Attending: Radiation Oncology | Admitting: Radiation Oncology

## 2013-05-20 ENCOUNTER — Other Ambulatory Visit (HOSPITAL_BASED_OUTPATIENT_CLINIC_OR_DEPARTMENT_OTHER): Payer: Medicare Other

## 2013-05-20 VITALS — BP 127/59 | HR 76 | Temp 97.5°F | Resp 18 | Ht 74.0 in | Wt 172.3 lb

## 2013-05-20 DIAGNOSIS — C01 Malignant neoplasm of base of tongue: Secondary | ICD-10-CM

## 2013-05-20 DIAGNOSIS — C770 Secondary and unspecified malignant neoplasm of lymph nodes of head, face and neck: Secondary | ICD-10-CM

## 2013-05-20 DIAGNOSIS — R944 Abnormal results of kidney function studies: Secondary | ICD-10-CM | POA: Diagnosis not present

## 2013-05-20 DIAGNOSIS — R5381 Other malaise: Secondary | ICD-10-CM | POA: Diagnosis not present

## 2013-05-20 DIAGNOSIS — B977 Papillomavirus as the cause of diseases classified elsewhere: Secondary | ICD-10-CM | POA: Diagnosis not present

## 2013-05-20 DIAGNOSIS — R634 Abnormal weight loss: Secondary | ICD-10-CM | POA: Diagnosis not present

## 2013-05-20 DIAGNOSIS — R5383 Other fatigue: Secondary | ICD-10-CM

## 2013-05-20 DIAGNOSIS — R799 Abnormal finding of blood chemistry, unspecified: Secondary | ICD-10-CM | POA: Diagnosis not present

## 2013-05-20 DIAGNOSIS — R7989 Other specified abnormal findings of blood chemistry: Secondary | ICD-10-CM | POA: Diagnosis not present

## 2013-05-20 DIAGNOSIS — R21 Rash and other nonspecific skin eruption: Secondary | ICD-10-CM

## 2013-05-20 DIAGNOSIS — R630 Anorexia: Secondary | ICD-10-CM

## 2013-05-20 DIAGNOSIS — Z510 Encounter for antineoplastic radiation therapy: Secondary | ICD-10-CM | POA: Diagnosis not present

## 2013-05-20 DIAGNOSIS — R599 Enlarged lymph nodes, unspecified: Secondary | ICD-10-CM | POA: Diagnosis not present

## 2013-05-20 LAB — CBC WITH DIFFERENTIAL/PLATELET
BASO%: 0.3 % (ref 0.0–2.0)
Basophils Absolute: 0 10*3/uL (ref 0.0–0.1)
EOS%: 2.4 % (ref 0.0–7.0)
Eosinophils Absolute: 0.2 10*3/uL (ref 0.0–0.5)
HCT: 39.8 % (ref 38.4–49.9)
HGB: 13.1 g/dL (ref 13.0–17.1)
LYMPH%: 8.3 % — AB (ref 14.0–49.0)
MCH: 28.7 pg (ref 27.2–33.4)
MCHC: 32.9 g/dL (ref 32.0–36.0)
MCV: 87.1 fL (ref 79.3–98.0)
MONO#: 0.6 10*3/uL (ref 0.1–0.9)
MONO%: 9 % (ref 0.0–14.0)
NEUT#: 5.4 10*3/uL (ref 1.5–6.5)
NEUT%: 80 % — ABNORMAL HIGH (ref 39.0–75.0)
PLATELETS: 176 10*3/uL (ref 140–400)
RBC: 4.57 10*6/uL (ref 4.20–5.82)
RDW: 14.2 % (ref 11.0–14.6)
WBC: 6.8 10*3/uL (ref 4.0–10.3)
lymph#: 0.6 10*3/uL — ABNORMAL LOW (ref 0.9–3.3)

## 2013-05-20 LAB — BASIC METABOLIC PANEL (CC13)
Anion Gap: 7 mEq/L (ref 3–11)
BUN: 27.1 mg/dL — AB (ref 7.0–26.0)
CALCIUM: 8.8 mg/dL (ref 8.4–10.4)
CO2: 31 meq/L — AB (ref 22–29)
CREATININE: 1.1 mg/dL (ref 0.7–1.3)
Chloride: 101 mEq/L (ref 98–109)
GLUCOSE: 224 mg/dL — AB (ref 70–140)
Potassium: 4.3 mEq/L (ref 3.5–5.1)
Sodium: 138 mEq/L (ref 136–145)

## 2013-05-20 LAB — MAGNESIUM (CC13): Magnesium: 1.6 mg/dl (ref 1.5–2.5)

## 2013-05-20 NOTE — Telephone Encounter (Signed)
Gave pt appt for lab,md and chemo for April 2015 °

## 2013-05-20 NOTE — Progress Notes (Signed)
To provide support and care continuity, met with patient during [place] appt with Dr. Alvy Bimler.  He denied any needs or concerns.  Continuing navigation as L1 patient (new patient).  Gayleen Orem, RN, BSN, [facility] Ne[facility] Oncology Navigator [phone]

## 2013-05-20 NOTE — Progress Notes (Signed)
[place] OFFICE PROGRESS NOTE  Patient Care Team: Tivis Ringer, MD as PCP - General (Internal Medicine) Brooks Sailors, RN as Registered Nurse (Oncology) Heath Lark, MD as Consulting Physician (Hematology and Oncology)  DIAGNOSIS: Tongue cancer, for further management  SUMMARY OF ONCOLOGIC HISTORY: Oncology History   Base of tongue cancer, HPV positive   Primary site: Lip and Oral Cavity (Bilateral)   Staging method: AJCC 7th Edition   Clinical: Stage IVA (T2, N2c, M0) signed by Heath Lark, MD on 04/18/2013  2:21 PM   Summary: Stage IVA (T2, N2c, M0)       Malignant neoplasm of base of tongue   04/01/2013 Imaging Ct scan of neck showed base of tongue cancer involving vallecula and bilateral LN metastasis   04/15/2013 Surgery Laryngoscopy and biopsy 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   05/05/2013 Procedure The patient had placement of port and feeding tube.   05/07/2013 -  Radiation Therapy The patient received radiation therapy   05/07/2013 -  Chemotherapy Patient received weekly cetuximab   05/14/2013 Adverse Reaction Grade 1 to 2 skin rash is noted    INTERVAL HISTORY: [facility] 80 y.o. male returns for further followup. He said her persistent skin rash. Mucositis in his throat is slightly worse but is not causing pain with swallowing. He is able to maintain his weight somewhat although he did lost 2 pounds since the last time I saw him. Denies any diarrhea. He still has excessive sedation after IV Benadryl that was given as premed and requested the dose to be reduced further.  I have reviewed the past medical history, past surgical history, social history and family history with the patient and they are unchanged from previous  note.  ALLERGIES:  has No Known Allergies.  MEDICATIONS:  Current Outpatient Prescriptions  Medication Sig Dispense Refill  . aspirin 81 MG tablet Take 81 mg by mouth daily.      Marland Kitchen atorvastatin (LIPITOR) 20 MG tablet Take 20 mg by mouth every other day.       Marland Kitchen glipiZIDE (GLUCOTROL XL) 5 MG 24 hr tablet Take 5 mg by mouth daily.      Marland Kitchen ibuprofen (ADVIL,MOTRIN) 200 MG tablet Take 400 mg by mouth every 6 (six) hours as needed (pain).       Marland Kitchen lidocaine-prilocaine (EMLA) cream Apply 1 application topically as needed.  30 g  0  . metFORMIN (GLUCOPHAGE) 850 MG tablet Take 850 mg by mouth 2 (two) times daily with a meal.      . Multiple Vitamins-Minerals (PRESERVISION AREDS 2) CAPS Take 1 capsule by mouth 2 (two) times daily.      . Nutritional Supplements (FEEDING SUPPLEMENT, OSMOLITE 1.5 CAL,) LIQD Begin one can of Osmolite 1.5 four times a day with 120 mL free water before and after each bolus.  Increase bolus feeds to 1-1/2 cans Osmolite 1.5 - four times a day. Please send formula and supplies.  1422 mL  0  . sodium fluoride (FLUORISHIELD) 1.1 % GEL dental gel Place 1 application onto teeth at bedtime.      . valsartan-hydrochlorothiazide (DIOVAN-HCT) 320-12.5 MG per tablet Take 1 tablet by mouth daily.       Marland Kitchen zolpidem (AMBIEN) 10 MG tablet Take 5 mg by mouth at bedtime as needed for  sleep.      . clindamycin (CLINDAGEL) 1 % gel Apply topically 2 (two) times daily.  30 g  0  . HYDROcodone-acetaminophen (NORCO/VICODIN) 5-325 MG per tablet Take 1-2 tablets by mouth every 4 (four) hours as needed.  40 tablet  0  . ondansetron (ZOFRAN) 8 MG tablet Take 1 tablet (8 mg total) by mouth every 8 (eight) hours as needed for nausea.  30 tablet  3  . promethazine (PHENERGAN) 25 MG tablet Take 1 tablet (25 mg total) by mouth every 6 (six) hours as needed for nausea or vomiting.  30 tablet  3   No current facility-administered medications for this visit.    REVIEW OF SYSTEMS:   Constitutional: Denies  fevers, chills Eyes: Denies blurriness of vision Respiratory: Denies cough, dyspnea or wheezes Cardiovascular: Denies palpitation, chest discomfort or lower extremity swelling Gastrointestinal:  Denies nausea, heartburn or change in bowel habits Lymphatics: Denies new lymphadenopathy or easy bruising Neurological:Denies numbness, tingling or new weaknesses Behavioral/Psych: Mood is stable, no new changes  All other systems were reviewed with the patient and are negative.  PHYSICAL EXAMINATION: ECOG PERFORMANCE STATUS: 1 - Symptomatic but completely ambulatory  Filed Vitals:   05/20/13 1449  BP: 127/59  Pulse: 76  Temp: 97.5 F (36.4 C)  Resp: 18   Filed Weights   05/20/13 1449  Weight: 172 lb 4.8 oz (78.155 kg)    GENERAL:alert, no distress and comfortable SKIN: Diffuse acneform rash consistent with Erbitux induced skin rash, not worse compared to last visit EYES: normal, Conjunctiva are pink and non-injected, sclera clear OROPHARYNX: Evidence of mucositis with no oral thrush. NECK: supple, thyroid normal size, non-tender, without nodularity LYMPH:  Previously palpable lymphadenopathy in the neck has improved and reduced in size.  LUNGS: clear to auscultation and percussion with normal breathing effort HEART: regular rate & rhythm and no murmurs and no lower extremity edema ABDOMEN:abdomen soft, non-tender and normal bowel sounds. Feeding tube site looks okay. Musculoskeletal:no cyanosis of digits and no clubbing  NEURO: alert & oriented x 3 with fluent speech, no focal motor/sensory deficits  LABORATORY DATA:  I have reviewed the data as listed    Component Value Date/Time   NA 138 05/20/2013 1347   NA 143 04/29/2013 0844   K 4.3 05/20/2013 1347   K 4.3 04/29/2013 0844   CL 103 04/29/2013 0844   CO2 31* 05/20/2013 1347   CO2 29 04/29/2013 0844   GLUCOSE 224* 05/20/2013 1347   GLUCOSE 106* 04/29/2013 0844   BUN 27.1* 05/20/2013 1347   BUN 21 04/29/2013 0844   CREATININE 1.1  05/20/2013 1347   CREATININE 0.98 04/29/2013 0844   CALCIUM 8.8 05/20/2013 1347   CALCIUM 9.4 04/29/2013 0844   PROT 6.7 04/29/2013 0844   ALBUMIN 3.8 04/29/2013 0844   AST 25 04/29/2013 0844   ALT 28 04/29/2013 0844   ALKPHOS 103 04/29/2013 0844   BILITOT 0.6 04/29/2013 0844   GFRNONAA 76* 04/29/2013 0844   GFRAA 88* 04/29/2013 0844    No results found for this basename: SPEP,  UPEP,   kappa and lambda light chains    Lab Results  Component Value Date   WBC 6.8 05/20/2013   NEUTROABS 5.4 05/20/2013   HGB 13.1 05/20/2013   HCT 39.8 05/20/2013   MCV 87.1 05/20/2013   PLT 176 05/20/2013      Chemistry      Component Value Date/Time   NA 138 05/20/2013 1347   NA 143 04/29/2013 0844  K 4.3 05/20/2013 1347   K 4.3 04/29/2013 0844   CL 103 04/29/2013 0844   CO2 31* 05/20/2013 1347   CO2 29 04/29/2013 0844   BUN 27.1* 05/20/2013 1347   BUN 21 04/29/2013 0844   CREATININE 1.1 05/20/2013 1347   CREATININE 0.98 04/29/2013 0844      Component Value Date/Time   CALCIUM 8.8 05/20/2013 1347   CALCIUM 9.4 04/29/2013 0844   ALKPHOS 103 04/29/2013 0844   AST 25 04/29/2013 0844   ALT 28 04/29/2013 0844   BILITOT 0.6 04/29/2013 0844     ASSESSMENT & PLAN:  #1 Tongue cancer He is experiencing early side effects from treatment. Continue supportive care #2 Skin rash This is due to side-effects of Cetuximab. I recommend Benadyl prn for itching and hydrocortisone cream. If it gets worse, he will start minocycline and topical antibiotic cream #3 Anorexia with mild weight loss Continue nutritional support through feeding tube #4 Fatigue Likely side-effects from treatment #5 Excessive sedation I will modify his premed by reducing the dose of Benadryl further per patient request. #6 elevated BUN and creatinine Even though the numbers are still within normal limits, this is increased compared to baseline. I recommend him to increase oral fluid intake via feeding tube if possible.  Orders Placed This Encounter  Procedures  .  CBC with Differential    Standing Status: Future     Number of Occurrences:      Standing Expiration Date: 05/20/2014  . Comprehensive metabolic panel    Standing Status: Future     Number of Occurrences:      Standing Expiration Date: 05/20/2014  . Magnesium    Standing Status: Future     Number of Occurrences:      Standing Expiration Date: 05/20/2014   All questions were answered. The patient knows to call the clinic with any problems, questions or concerns. No barriers to learning was detected. I spent 25 minutes counseling the patient face to face. The total time spent in the appointment was 30 minutes and more than 50% was on counseling and review of test results     [facility], [place], MD 05/20/2013 3:25 PM

## 2013-05-21 ENCOUNTER — Ambulatory Visit: Payer: Medicare Other | Admitting: Nutrition

## 2013-05-21 ENCOUNTER — Inpatient Hospital Stay: Admission: RE | Admit: 2013-05-21 | Payer: Self-pay | Source: Ambulatory Visit | Admitting: Radiation Oncology

## 2013-05-21 ENCOUNTER — Encounter: Payer: Self-pay | Admitting: Radiation Oncology

## 2013-05-21 ENCOUNTER — Ambulatory Visit (HOSPITAL_BASED_OUTPATIENT_CLINIC_OR_DEPARTMENT_OTHER): Payer: Medicare Other

## 2013-05-21 ENCOUNTER — Other Ambulatory Visit: Payer: Self-pay

## 2013-05-21 ENCOUNTER — Ambulatory Visit
Admission: RE | Admit: 2013-05-21 | Discharge: 2013-05-21 | Disposition: A | Discharge status: Home / Self Care | Payer: Medicare Other | Source: Ambulatory Visit | Attending: Radiation Oncology | Admitting: Radiation Oncology

## 2013-05-21 ENCOUNTER — Other Ambulatory Visit: Payer: Self-pay | Admitting: Radiation Oncology

## 2013-05-21 VITALS — BP 114/59 | HR 93 | Temp 98.6°F | Resp 17

## 2013-05-21 DIAGNOSIS — R799 Abnormal finding of blood chemistry, unspecified: Secondary | ICD-10-CM | POA: Diagnosis not present

## 2013-05-21 DIAGNOSIS — C01 Malignant neoplasm of base of tongue: Secondary | ICD-10-CM

## 2013-05-21 DIAGNOSIS — Z5112 Encounter for antineoplastic immunotherapy: Secondary | ICD-10-CM

## 2013-05-21 DIAGNOSIS — B977 Papillomavirus as the cause of diseases classified elsewhere: Secondary | ICD-10-CM | POA: Diagnosis not present

## 2013-05-21 DIAGNOSIS — C770 Secondary and unspecified malignant neoplasm of lymph nodes of head, face and neck: Secondary | ICD-10-CM

## 2013-05-21 DIAGNOSIS — R599 Enlarged lymph nodes, unspecified: Secondary | ICD-10-CM | POA: Diagnosis not present

## 2013-05-21 DIAGNOSIS — R21 Rash and other nonspecific skin eruption: Secondary | ICD-10-CM | POA: Diagnosis not present

## 2013-05-21 DIAGNOSIS — Z510 Encounter for antineoplastic radiation therapy: Secondary | ICD-10-CM | POA: Diagnosis not present

## 2013-05-21 MED ORDER — CETUXIMAB CHEMO IV INJECTION 200 MG/100ML
250.0000 mg/m2 | Freq: Once | INTRAVENOUS | Status: AC
  Administered 2013-05-21: 500 mg via INTRAVENOUS
  Filled 2013-05-21: qty 250

## 2013-05-21 MED ORDER — SODIUM CHLORIDE 0.9 % IJ SOLN
10.0000 mL | INTRAMUSCULAR | Status: DC | PRN
  Administered 2013-05-21: 10 mL
  Filled 2013-05-21: qty 10

## 2013-05-21 MED ORDER — SODIUM CHLORIDE 0.9 % IV SOLN
Freq: Once | INTRAVENOUS | Status: AC
  Administered 2013-05-21: 10:00:00 via INTRAVENOUS

## 2013-05-21 MED ORDER — DIPHENHYDRAMINE HCL 50 MG/ML IJ SOLN
INTRAMUSCULAR | Status: AC
  Filled 2013-05-21: qty 1

## 2013-05-21 MED ORDER — MAGIC MOUTHWASH W/LIDOCAINE: ORAL | Status: DC

## 2013-05-21 MED ORDER — HEPARIN SOD (PORK) LOCK FLUSH 100 UNIT/ML IV SOLN
500.0000 [IU] | Freq: Once | INTRAVENOUS | Status: AC | PRN
  Administered 2013-05-21: 500 [IU]
  Filled 2013-05-21: qty 5

## 2013-05-21 MED ORDER — DIPHENHYDRAMINE HCL 50 MG/ML IJ SOLN
12.5000 mg | Freq: Once | INTRAMUSCULAR | Status: AC
  Administered 2013-05-21: 12.5 mg via INTRAVENOUS

## 2013-05-21 NOTE — Patient Instructions (Signed)
[facility] Discharge Instructions for Patients Receiving Chemotherapy  Today you received the following chemotherapy agents Erbitux  To help prevent nausea and vomiting after your treatment, we encourage you to take your nausea medication as prescribed.   If you develop nausea and vomiting that is not controlled by your nausea medication, call the clinic.   BELOW ARE SYMPTOMS THAT SHOULD BE REPORTED IMMEDIATELY:  *FEVER GREATER THAN 100.5 F  *CHILLS WITH OR WITHOUT FEVER  NAUSEA AND VOMITING THAT IS NOT CONTROLLED WITH YOUR NAUSEA MEDICATION  *UNUSUAL SHORTNESS OF BREATH  *UNUSUAL BRUISING OR BLEEDING  TENDERNESS IN MOUTH AND THROAT WITH OR WITHOUT PRESENCE OF ULCERS  *URINARY PROBLEMS  *BOWEL PROBLEMS  UNUSUAL RASH Items with * indicate a potential emergency and should be followed up as soon as possible.  Feel free to call the clinic you have any questions or concerns. The clinic phone number is [phone].    

## 2013-05-21 NOTE — Progress Notes (Signed)
Brief nutrition followup completed in chemotherapy.  Patient continues to sleep soundly, during chemotherapy.  I spoke with patient's wife who reports patient is using his feeding tube exclusively without difficulty.  Patient tolerates, 1-1/2 cans of Osmolite 1.5 - 4 times a day with 120 mL of free water before and after bolus feedings.  This provides 2130 calories, 89 g protein, and 2046 mL of free water.  This meets 100% of estimated caloric needs and 81% of minimum protein needs.  Per patient's wife, no issues with tube feeding tolerance.  Patient's weight documented as 172.3 pounds on April 7, decreased from 175.8 pounds March 30.  Nutrition diagnosis: Inadequate oral intake continues.  Intervention: Patient should continue Osmolite 1.5 using 1-1/2 cans 4 times a day with 120 mL of free water before and after each bolus feeding.  If patient tolerates oral intake he should concentrate on protein containing foods.  Patient's wife verbalizes understanding and appreciation.  Monitoring, evaluation, goals: Patient will tolerate tube feedings to promote weight stabilization.  Next visit: Wednesday, April 15, during chemotherapy.

## 2013-05-22 ENCOUNTER — Ambulatory Visit
Admission: RE | Admit: 2013-05-22 | Discharge: 2013-05-22 | Disposition: A | Discharge status: Home / Self Care | Payer: Medicare Other | Source: Ambulatory Visit | Attending: Radiation Oncology | Admitting: Radiation Oncology

## 2013-05-22 DIAGNOSIS — R21 Rash and other nonspecific skin eruption: Secondary | ICD-10-CM | POA: Diagnosis not present

## 2013-05-22 DIAGNOSIS — C01 Malignant neoplasm of base of tongue: Secondary | ICD-10-CM | POA: Diagnosis not present

## 2013-05-22 DIAGNOSIS — B977 Papillomavirus as the cause of diseases classified elsewhere: Secondary | ICD-10-CM | POA: Diagnosis not present

## 2013-05-22 DIAGNOSIS — R799 Abnormal finding of blood chemistry, unspecified: Secondary | ICD-10-CM | POA: Diagnosis not present

## 2013-05-22 DIAGNOSIS — R599 Enlarged lymph nodes, unspecified: Secondary | ICD-10-CM | POA: Diagnosis not present

## 2013-05-22 DIAGNOSIS — Z510 Encounter for antineoplastic radiation therapy: Secondary | ICD-10-CM | POA: Diagnosis not present

## 2013-05-23 ENCOUNTER — Ambulatory Visit
Admission: RE | Admit: 2013-05-23 | Discharge: 2013-05-23 | Disposition: A | Discharge status: Home / Self Care | Payer: Medicare Other | Source: Ambulatory Visit | Attending: Radiation Oncology | Admitting: Radiation Oncology

## 2013-05-23 DIAGNOSIS — R799 Abnormal finding of blood chemistry, unspecified: Secondary | ICD-10-CM | POA: Diagnosis not present

## 2013-05-23 DIAGNOSIS — B977 Papillomavirus as the cause of diseases classified elsewhere: Secondary | ICD-10-CM | POA: Diagnosis not present

## 2013-05-23 DIAGNOSIS — Z510 Encounter for antineoplastic radiation therapy: Secondary | ICD-10-CM | POA: Diagnosis not present

## 2013-05-23 DIAGNOSIS — R21 Rash and other nonspecific skin eruption: Secondary | ICD-10-CM | POA: Diagnosis not present

## 2013-05-23 DIAGNOSIS — R599 Enlarged lymph nodes, unspecified: Secondary | ICD-10-CM | POA: Diagnosis not present

## 2013-05-23 DIAGNOSIS — C01 Malignant neoplasm of base of tongue: Secondary | ICD-10-CM | POA: Diagnosis not present

## 2013-05-26 ENCOUNTER — Ambulatory Visit
Admission: RE | Admit: 2013-05-26 | Discharge: 2013-05-26 | Disposition: A | Discharge status: Home / Self Care | Payer: Medicare Other | Source: Ambulatory Visit | Attending: Radiation Oncology | Admitting: Radiation Oncology

## 2013-05-26 ENCOUNTER — Encounter: Payer: Self-pay | Admitting: *Deleted

## 2013-05-26 ENCOUNTER — Ambulatory Visit: Payer: Medicare Other

## 2013-05-26 ENCOUNTER — Ambulatory Visit: Payer: Medicare Other | Attending: Radiation Oncology

## 2013-05-26 ENCOUNTER — Encounter: Payer: Self-pay | Admitting: Radiation Oncology

## 2013-05-26 VITALS — BP 72/45 | HR 96 | Temp 97.7°F | Resp 20 | Wt 172.6 lb

## 2013-05-26 DIAGNOSIS — R1310 Dysphagia, unspecified: Secondary | ICD-10-CM | POA: Insufficient documentation

## 2013-05-26 DIAGNOSIS — R21 Rash and other nonspecific skin eruption: Secondary | ICD-10-CM | POA: Diagnosis not present

## 2013-05-26 DIAGNOSIS — C01 Malignant neoplasm of base of tongue: Secondary | ICD-10-CM | POA: Diagnosis not present

## 2013-05-26 DIAGNOSIS — Z5189 Encounter for other specified aftercare: Secondary | ICD-10-CM | POA: Insufficient documentation

## 2013-05-26 DIAGNOSIS — R293 Abnormal posture: Secondary | ICD-10-CM | POA: Insufficient documentation

## 2013-05-26 DIAGNOSIS — Z510 Encounter for antineoplastic radiation therapy: Secondary | ICD-10-CM | POA: Diagnosis not present

## 2013-05-26 DIAGNOSIS — R599 Enlarged lymph nodes, unspecified: Secondary | ICD-10-CM | POA: Diagnosis not present

## 2013-05-26 DIAGNOSIS — B977 Papillomavirus as the cause of diseases classified elsewhere: Secondary | ICD-10-CM | POA: Diagnosis not present

## 2013-05-26 DIAGNOSIS — R799 Abnormal finding of blood chemistry, unspecified: Secondary | ICD-10-CM | POA: Diagnosis not present

## 2013-05-26 NOTE — Progress Notes (Signed)
   Weekly Management Note:  Outpatient Current Dose:  28 Gy  Projected Dose: 70 Gy   Narrative:  The patient presents for routine under treatment assessment.  CBCT/MVCT images/Port film x-rays were reviewed.  The chart was checked.  Has throat pain, only using MMW 1x/day as it tastes "terrible". Almost all intake via PEG tube.  Physical Findings:  weight is 172 lb 9.6 oz (78.291 kg). His oral temperature is 97.7 F (36.5 C). His blood pressure is 72/45 and his pulse is 96. His respiration is 20.  no oropharyngeal lesions. No appreciable adenopathy palpated in neck. Skin erythematous, intact  CBC    Component Value Date/Time   WBC 6.8 05/20/2013 1347   WBC 6.4 04/29/2013 0844   WBC 9.2 03/02/2013 1528   RBC 4.57 05/20/2013 1347   RBC 4.72 04/29/2013 0844   RBC 5.01 03/02/2013 1528   HGB 13.1 05/20/2013 1347   HGB 13.7 04/29/2013 0844   HGB 14.4 03/02/2013 1528   HCT 39.8 05/20/2013 1347   HCT 40.7 04/29/2013 0844   HCT 46.8 03/02/2013 1528   PLT 176 05/20/2013 1347   PLT 183 04/29/2013 0844   MCV 87.1 05/20/2013 1347   MCV 86.2 04/29/2013 0844   MCV 93.5 03/02/2013 1528   MCH 28.7 05/20/2013 1347   MCH 29.0 04/29/2013 0844   MCH 28.7 03/02/2013 1528   MCHC 32.9 05/20/2013 1347   MCHC 33.7 04/29/2013 0844   MCHC 30.8* 03/02/2013 1528   RDW 14.2 05/20/2013 1347   RDW 14.2 04/29/2013 0844   LYMPHSABS 0.6* 05/20/2013 1347   LYMPHSABS 1.0 04/29/2013 0844   MONOABS 0.6 05/20/2013 1347   MONOABS 0.6 04/29/2013 0844   EOSABS 0.2 05/20/2013 1347   EOSABS 0.3 04/29/2013 0844   BASOSABS 0.0 05/20/2013 1347   BASOSABS 0.0 04/29/2013 0844     CMP     Component Value Date/Time   NA 138 05/20/2013 1347   NA 143 04/29/2013 0844   K 4.3 05/20/2013 1347   K 4.3 04/29/2013 0844   CL 103 04/29/2013 0844   CO2 31* 05/20/2013 1347   CO2 29 04/29/2013 0844   GLUCOSE 224* 05/20/2013 1347   GLUCOSE 106* 04/29/2013 0844   BUN 27.1* 05/20/2013 1347   BUN 21 04/29/2013 0844   CREATININE 1.1 05/20/2013 1347   CREATININE 0.98 04/29/2013 0844   CALCIUM 8.8 05/20/2013 1347   CALCIUM 9.4 04/29/2013 0844   PROT 6.7 04/29/2013 0844   ALBUMIN 3.8 04/29/2013 0844   AST 25 04/29/2013 0844   ALT 28 04/29/2013 0844   ALKPHOS 103 04/29/2013 0844   BILITOT 0.6 04/29/2013 0844   GFRNONAA 76* 04/29/2013 0844   GFRAA 88* 04/29/2013 0844     Impression:  The patient is tolerating radiotherapy.   Plan:  Continue radiotherapy as planned. Rx for sucralfate given.  Urged him to swallow as tolerated to prevent dysphagia.  Dr Gaspar Bidding declined pain meds today for sore throat.  -----------------------------------  Eppie Gibson, MD

## 2013-05-26 NOTE — Progress Notes (Signed)
Weekly rad txs b/l neck, 14 txs completed,  erythema rash on neck/face, he uuses steroid cream on face/nose, and then biafine cream over rest, took ortho static b/p,'s, siting=84/51, P=91,RR=20, T=97.7 Standing B/P= 72/45,P=96, asked if he took his diovan today"Yes", will hold it for tomorrow, no c/o dizziness, pain when swallowing, , only uses MMW 1 time a day at night when brushing his teeth, , Tastes terrible, stated patiement", 5-6 cans osmolite via peg daily stated, and 12oml free water before and after bolus feedings ,mouth dry,saw Ernestene Kiel 05/20/13, , gave soft toothbrush, also flyer on biotene, he uses [employer] sensitive toothpaste, asked what other tooth paste he could use, unable to swaloow food/or fluids, dissolves all medications via peg,  2:52 PM

## 2013-05-27 ENCOUNTER — Encounter: Payer: Self-pay | Admitting: Hematology and Oncology

## 2013-05-27 ENCOUNTER — Other Ambulatory Visit (HOSPITAL_BASED_OUTPATIENT_CLINIC_OR_DEPARTMENT_OTHER): Payer: Medicare Other

## 2013-05-27 ENCOUNTER — Telehealth: Payer: Self-pay | Admitting: Hematology and Oncology

## 2013-05-27 ENCOUNTER — Ambulatory Visit (HOSPITAL_BASED_OUTPATIENT_CLINIC_OR_DEPARTMENT_OTHER): Payer: Medicare Other | Admitting: Hematology and Oncology

## 2013-05-27 ENCOUNTER — Ambulatory Visit
Admission: RE | Admit: 2013-05-27 | Discharge: 2013-05-27 | Disposition: A | Discharge status: Home / Self Care | Payer: Medicare Other | Source: Ambulatory Visit | Attending: Radiation Oncology | Admitting: Radiation Oncology

## 2013-05-27 VITALS — BP 120/52 | HR 95 | Temp 98.1°F | Resp 18 | Ht 74.0 in | Wt 173.4 lb

## 2013-05-27 DIAGNOSIS — R5383 Other fatigue: Secondary | ICD-10-CM | POA: Diagnosis not present

## 2013-05-27 DIAGNOSIS — D649 Anemia, unspecified: Secondary | ICD-10-CM

## 2013-05-27 DIAGNOSIS — R599 Enlarged lymph nodes, unspecified: Secondary | ICD-10-CM | POA: Diagnosis not present

## 2013-05-27 DIAGNOSIS — R630 Anorexia: Secondary | ICD-10-CM | POA: Diagnosis not present

## 2013-05-27 DIAGNOSIS — R944 Abnormal results of kidney function studies: Secondary | ICD-10-CM | POA: Diagnosis not present

## 2013-05-27 DIAGNOSIS — R634 Abnormal weight loss: Secondary | ICD-10-CM

## 2013-05-27 DIAGNOSIS — Z510 Encounter for antineoplastic radiation therapy: Secondary | ICD-10-CM | POA: Diagnosis not present

## 2013-05-27 DIAGNOSIS — C01 Malignant neoplasm of base of tongue: Secondary | ICD-10-CM

## 2013-05-27 DIAGNOSIS — B977 Papillomavirus as the cause of diseases classified elsewhere: Secondary | ICD-10-CM | POA: Diagnosis not present

## 2013-05-27 DIAGNOSIS — R799 Abnormal finding of blood chemistry, unspecified: Secondary | ICD-10-CM | POA: Diagnosis not present

## 2013-05-27 DIAGNOSIS — R21 Rash and other nonspecific skin eruption: Secondary | ICD-10-CM

## 2013-05-27 DIAGNOSIS — R5381 Other malaise: Secondary | ICD-10-CM | POA: Diagnosis not present

## 2013-05-27 LAB — CBC WITH DIFFERENTIAL/PLATELET
BASO%: 0.2 % (ref 0.0–2.0)
Basophils Absolute: 0 10*3/uL (ref 0.0–0.1)
EOS%: 3.4 % (ref 0.0–7.0)
Eosinophils Absolute: 0.2 10*3/uL (ref 0.0–0.5)
HEMATOCRIT: 38.4 % (ref 38.4–49.9)
HGB: 12.6 g/dL — ABNORMAL LOW (ref 13.0–17.1)
LYMPH%: 8.7 % — AB (ref 14.0–49.0)
MCH: 28.4 pg (ref 27.2–33.4)
MCHC: 32.8 g/dL (ref 32.0–36.0)
MCV: 86.6 fL (ref 79.3–98.0)
MONO#: 0.7 10*3/uL (ref 0.1–0.9)
MONO%: 12.8 % (ref 0.0–14.0)
NEUT#: 4 10*3/uL (ref 1.5–6.5)
NEUT%: 74.9 % (ref 39.0–75.0)
PLATELETS: 185 10*3/uL (ref 140–400)
RBC: 4.43 10*6/uL (ref 4.20–5.82)
RDW: 14.6 % (ref 11.0–14.6)
WBC: 5.4 10*3/uL (ref 4.0–10.3)
lymph#: 0.5 10*3/uL — ABNORMAL LOW (ref 0.9–3.3)

## 2013-05-27 LAB — COMPREHENSIVE METABOLIC PANEL (CC13)
ALT: 36 U/L (ref 0–55)
ANION GAP: 9 meq/L (ref 3–11)
AST: 27 U/L (ref 5–34)
Albumin: 3.2 g/dL — ABNORMAL LOW (ref 3.5–5.0)
Alkaline Phosphatase: 121 U/L (ref 40–150)
BILIRUBIN TOTAL: 0.47 mg/dL (ref 0.20–1.20)
BUN: 33.1 mg/dL — ABNORMAL HIGH (ref 7.0–26.0)
CO2: 27 meq/L (ref 22–29)
CREATININE: 1.2 mg/dL (ref 0.7–1.3)
Calcium: 8.9 mg/dL (ref 8.4–10.4)
Chloride: 101 mEq/L (ref 98–109)
GLUCOSE: 205 mg/dL — AB (ref 70–140)
Potassium: 4.3 mEq/L (ref 3.5–5.1)
Sodium: 137 mEq/L (ref 136–145)
Total Protein: 6.3 g/dL — ABNORMAL LOW (ref 6.4–8.3)

## 2013-05-27 LAB — MAGNESIUM (CC13): Magnesium: 1.5 mg/dl (ref 1.5–2.5)

## 2013-05-27 NOTE — Progress Notes (Signed)
[facility] OFFICE PROGRESS NOTE  Patient Care Team: Tivis Ringer, MD as PCP - General (Internal Medicine) Brooks Sailors, RN as Registered Nurse (Oncology) Heath Lark, MD as Consulting Physician (Hematology and Oncology)  DIAGNOSIS: Base of tongue cancer, undergoing chemoradiation therapy  SUMMARY OF ONCOLOGIC HISTORY: Oncology History   Base of tongue cancer, HPV positive   Primary site: Lip and Oral Cavity (Bilateral)   Staging method: AJCC 7th Edition   Clinical: Stage IVA (T2, N2c, M0) signed by Heath Lark, MD on 04/18/2013  2:21 PM   Summary: Stage IVA (T2, N2c, M0)       Malignant neoplasm of base of tongue   04/01/2013 Imaging Ct scan of neck showed base of tongue cancer involving vallecula and bilateral LN metastasis   04/15/2013 Surgery Laryngoscopy and biopsy 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   05/05/2013 Procedure The patient had placement of port and feeding tube.   05/07/2013 -  Radiation Therapy The patient received radiation therapy   05/07/2013 -  Chemotherapy Patient received weekly cetuximab   05/14/2013 Adverse Reaction Grade 1 to 2 skin rash is noted    INTERVAL HISTORY: [facility] 80 y.o. male returns for further followup. His skin rash is improving with topical cream. The neck mass in the lymph node regions are no longer palpable. He is able to maintain his weight using nutritional supplement through his feeding tube. He has some mild nausea but no vomiting. He denies any throat pain.  I have reviewed the past medical history, past surgical history, social history and family history with the patient and they are unchanged from previous note.  ALLERGIES:  has No Known Allergies.  MEDICATIONS:  Current Outpatient  Prescriptions  Medication Sig Dispense Refill  . Alum & Mag Hydroxide-Simeth (MAGIC MOUTHWASH W/LIDOCAINE) SOLN 1part nystatin,1part Maaloxplus,1part benadryl,3part 2%viscous lidocaine. Swish/swallow 10 mL up to QID, 60min before meals/bedtime  480 mL  5  . aspirin 81 MG tablet Take 81 mg by mouth daily.      Marland Kitchen atorvastatin (LIPITOR) 20 MG tablet Take 20 mg by mouth every other day.       . clindamycin (CLINDAGEL) 1 % gel Apply topically 2 (two) times daily.  30 g  0  . glipiZIDE (GLUCOTROL XL) 5 MG 24 hr tablet Take 5 mg by mouth daily.      Marland Kitchen HYDROcodone-acetaminophen (NORCO/VICODIN) 5-325 MG per tablet Take 1-2 tablets by mouth every 4 (four) hours as needed.  40 tablet  0  . ibuprofen (ADVIL,MOTRIN) 200 MG tablet Take 400 mg by mouth every 6 (six) hours as needed (pain).       Marland Kitchen lidocaine-prilocaine (EMLA) cream Apply 1 application topically as needed.  30 g  0  . metFORMIN (GLUCOPHAGE) 850 MG tablet Take 850 mg by mouth 2 (two) times daily with a meal.      . Multiple Vitamins-Minerals (PRESERVISION AREDS 2) CAPS Take 1 capsule by mouth 2 (two) times daily.      . Nutritional Supplements (FEEDING SUPPLEMENT, OSMOLITE 1.5 CAL,) LIQD Begin one can of Osmolite 1.5 four times a day with 120 mL free water before and after each bolus.  Increase bolus feeds to 1-1/2 cans Osmolite 1.5 - four times a day. Please send formula and supplies.  1422 mL  0  .  ondansetron (ZOFRAN) 8 MG tablet Take 1 tablet (8 mg total) by mouth every 8 (eight) hours as needed for nausea.  30 tablet  3  . promethazine (PHENERGAN) 25 MG tablet Take 1 tablet (25 mg total) by mouth every 6 (six) hours as needed for nausea or vomiting.  30 tablet  3  . sodium fluoride (FLUORISHIELD) 1.1 % GEL dental gel Place 1 application onto teeth at bedtime.      . valsartan-hydrochlorothiazide (DIOVAN-HCT) 320-12.5 MG per tablet Take 1 tablet by mouth daily.       Marland Kitchen zolpidem (AMBIEN) 10 MG tablet Take 5 mg by mouth at bedtime as needed for  sleep.       No current facility-administered medications for this visit.    REVIEW OF SYSTEMS:   Constitutional: Denies fevers, chills or abnormal weight loss Eyes: Denies blurriness of vision Ears, nose, mouth, throat, and face: Denies mucositis or sore throat Respiratory: Denies cough, dyspnea or wheezes Cardiovascular: Denies palpitation, chest discomfort or lower extremity swelling Gastrointestinal:  Denies nausea, heartburn or change in bowel habits Lymphatics: Denies new lymphadenopathy or easy bruising Neurological:Denies numbness, tingling or new weaknesses Behavioral/Psych: Mood is stable, no new changes  All other systems were reviewed with the patient and are negative.  PHYSICAL EXAMINATION: ECOG PERFORMANCE STATUS: 1 - Symptomatic but completely ambulatory  Filed Vitals:   05/27/13 1314  BP: 120/52  Pulse: 95  Temp: 98.1 F (36.7 C)  Resp: 18   Filed Weights   05/27/13 1314  Weight: 173 lb 6.4 oz (78.654 kg)    GENERAL:alert, no distress and comfortable SKIN: The skin rash around his face and his neck are stable and not worse. No ulceration is noted. EYES: normal, Conjunctiva are pink and non-injected, sclera clear OROPHARYNX:no exudate, no erythema and lips, buccal mucosa, and tongue normal. Noted mucositis but no thrush  NECK: supple, thyroid normal size, non-tender, without nodularity LYMPH:  He has a palpable lymphadenopathy in his neck had resolved LUNGS: clear to auscultation and percussion with normal breathing effort HEART: regular rate & rhythm and no murmurs and no lower extremity edema ABDOMEN:abdomen soft, non-tender and normal bowel sounds. Feeding tube site looks okay Musculoskeletal:no cyanosis of digits and no clubbing  NEURO: alert & oriented x 3 with fluent speech, no focal motor/sensory deficits  LABORATORY DATA:  I have reviewed the data as listed    Component Value Date/Time   NA 137 05/27/2013 1300   NA 143 04/29/2013 0844   K 4.3  05/27/2013 1300   K 4.3 04/29/2013 0844   CL 103 04/29/2013 0844   CO2 27 05/27/2013 1300   CO2 29 04/29/2013 0844   GLUCOSE 205* 05/27/2013 1300   GLUCOSE 106* 04/29/2013 0844   BUN 33.1* 05/27/2013 1300   BUN 21 04/29/2013 0844   CREATININE 1.2 05/27/2013 1300   CREATININE 0.98 04/29/2013 0844   CALCIUM 8.9 05/27/2013 1300   CALCIUM 9.4 04/29/2013 0844   PROT 6.3* 05/27/2013 1300   PROT 6.7 04/29/2013 0844   ALBUMIN 3.2* 05/27/2013 1300   ALBUMIN 3.8 04/29/2013 0844   AST 27 05/27/2013 1300   AST 25 04/29/2013 0844   ALT 36 05/27/2013 1300   ALT 28 04/29/2013 0844   ALKPHOS 121 05/27/2013 1300   ALKPHOS 103 04/29/2013 0844   BILITOT 0.47 05/27/2013 1300   BILITOT 0.6 04/29/2013 0844   GFRNONAA 76* 04/29/2013 0844   GFRAA 88* 04/29/2013 0844    No results found for this basename: SPEP,  UPEP,  kappa and lambda light chains    Lab Results  Component Value Date   WBC 5.4 05/27/2013   NEUTROABS 4.0 05/27/2013   HGB 12.6* 05/27/2013   HCT 38.4 05/27/2013   MCV 86.6 05/27/2013   PLT 185 05/27/2013      Chemistry      Component Value Date/Time   NA 137 05/27/2013 1300   NA 143 04/29/2013 0844   K 4.3 05/27/2013 1300   K 4.3 04/29/2013 0844   CL 103 04/29/2013 0844   CO2 27 05/27/2013 1300   CO2 29 04/29/2013 0844   BUN 33.1* 05/27/2013 1300   BUN 21 04/29/2013 0844   CREATININE 1.2 05/27/2013 1300   CREATININE 0.98 04/29/2013 0844      Component Value Date/Time   CALCIUM 8.9 05/27/2013 1300   CALCIUM 9.4 04/29/2013 0844   ALKPHOS 121 05/27/2013 1300   ALKPHOS 103 04/29/2013 0844   AST 27 05/27/2013 1300   AST 25 04/29/2013 0844   ALT 36 05/27/2013 1300   ALT 28 04/29/2013 0844   BILITOT 0.47 05/27/2013 1300   BILITOT 0.6 04/29/2013 0844     ASSESSMENT & PLAN:  #1 Tongue cancer He is experiencing expected side effects from treatment. Continue supportive care #2 Skin rash This is due to side-effects of Cetuximab. I recommend Benadyl prn for itching and hydrocortisone cream. If it gets worse, he will  start minocycline and topical antibiotic cream #3 Anorexia with mild weight loss Continue nutritional support through feeding tube #4 Fatigue Likely side-effects from treatment #5 Excessive sedation I will modify his premed by reducing the dose of Benadryl further per patient request. #6 elevated BUN and creatinine Even though the numbers are still within normal limits, this is increased compared to baseline. I recommend him to increase oral fluid intake via feeding tube if possible. #7 anemia This is likely anemia of chronic disease. The patient denies recent history of bleeding such as epistaxis, hematuria or hematochezia. He is asymptomatic from the anemia. We will observe for now.  He does not require transfusion now.    Orders Placed This Encounter  Procedures  . Magnesium    Standing Status: Future     Number of Occurrences:      Standing Expiration Date: 05/27/2014  . Basic metabolic panel    Standing Status: Future     Number of Occurrences:      Standing Expiration Date: 05/27/2014   All questions were answered. The patient knows to call the clinic with any problems, questions or concerns. No barriers to learning was detected. I spent 25 minutes counseling the patient face to face. The total time spent in the appointment was 30 minutes and more than 50% was on counseling and review of test results     Heath Lark, MD 05/27/2013 9:39 PM

## 2013-05-27 NOTE — Telephone Encounter (Signed)
gv adn printed appt sched and avs for pt for April  °

## 2013-05-28 ENCOUNTER — Ambulatory Visit
Admission: RE | Admit: 2013-05-28 | Discharge: 2013-05-28 | Disposition: A | Discharge status: Home / Self Care | Payer: Medicare Other | Source: Ambulatory Visit | Attending: Radiation Oncology | Admitting: Radiation Oncology

## 2013-05-28 ENCOUNTER — Ambulatory Visit: Payer: Medicare Other | Admitting: Nutrition

## 2013-05-28 ENCOUNTER — Ambulatory Visit (HOSPITAL_BASED_OUTPATIENT_CLINIC_OR_DEPARTMENT_OTHER): Payer: Medicare Other

## 2013-05-28 VITALS — BP 113/52 | HR 91 | Temp 97.9°F | Resp 17

## 2013-05-28 DIAGNOSIS — Z5112 Encounter for antineoplastic immunotherapy: Secondary | ICD-10-CM

## 2013-05-28 DIAGNOSIS — R21 Rash and other nonspecific skin eruption: Secondary | ICD-10-CM | POA: Diagnosis not present

## 2013-05-28 DIAGNOSIS — C01 Malignant neoplasm of base of tongue: Secondary | ICD-10-CM | POA: Diagnosis not present

## 2013-05-28 DIAGNOSIS — B977 Papillomavirus as the cause of diseases classified elsewhere: Secondary | ICD-10-CM | POA: Diagnosis not present

## 2013-05-28 DIAGNOSIS — R599 Enlarged lymph nodes, unspecified: Secondary | ICD-10-CM | POA: Diagnosis not present

## 2013-05-28 DIAGNOSIS — R799 Abnormal finding of blood chemistry, unspecified: Secondary | ICD-10-CM | POA: Diagnosis not present

## 2013-05-28 DIAGNOSIS — Z510 Encounter for antineoplastic radiation therapy: Secondary | ICD-10-CM | POA: Diagnosis not present

## 2013-05-28 MED ORDER — HEPARIN SOD (PORK) LOCK FLUSH 100 UNIT/ML IV SOLN
500.0000 [IU] | Freq: Once | INTRAVENOUS | Status: AC | PRN
  Administered 2013-05-28: 500 [IU]
  Filled 2013-05-28: qty 5

## 2013-05-28 MED ORDER — CETUXIMAB CHEMO IV INJECTION 200 MG/100ML
250.0000 mg/m2 | Freq: Once | INTRAVENOUS | Status: AC
  Administered 2013-05-28: 500 mg via INTRAVENOUS
  Filled 2013-05-28: qty 250

## 2013-05-28 MED ORDER — DIPHENHYDRAMINE HCL 50 MG/ML IJ SOLN
12.5000 mg | Freq: Once | INTRAMUSCULAR | Status: AC
  Administered 2013-05-28: 12.5 mg via INTRAVENOUS

## 2013-05-28 MED ORDER — SODIUM CHLORIDE 0.9 % IJ SOLN
10.0000 mL | INTRAMUSCULAR | Status: DC | PRN
  Administered 2013-05-28: 10 mL
  Filled 2013-05-28: qty 10

## 2013-05-28 MED ORDER — SODIUM CHLORIDE 0.9 % IV SOLN
Freq: Once | INTRAVENOUS | Status: AC
  Administered 2013-05-28: 10:00:00 via INTRAVENOUS

## 2013-05-28 MED ORDER — DIPHENHYDRAMINE HCL 50 MG/ML IJ SOLN
INTRAMUSCULAR | Status: AC
  Filled 2013-05-28: qty 1

## 2013-05-28 NOTE — Patient Instructions (Signed)
[facility] Discharge Instructions for Patients Receiving Chemotherapy  Today you received the following chemotherapy agents: Erbitux  To help prevent nausea and vomiting after your treatment, we encourage you to take your nausea medication: Zofran 8 mg every 8 hrs as needed or Compazine 10 mg every 6 hrs as needed.    If you develop nausea and vomiting that is not controlled by your nausea medication, call the clinic.   BELOW ARE SYMPTOMS THAT SHOULD BE REPORTED IMMEDIATELY:  *FEVER GREATER THAN 100.5 F  *CHILLS WITH OR WITHOUT FEVER  NAUSEA AND VOMITING THAT IS NOT CONTROLLED WITH YOUR NAUSEA MEDICATION  *UNUSUAL SHORTNESS OF BREATH  *UNUSUAL BRUISING OR BLEEDING  TENDERNESS IN MOUTH AND THROAT WITH OR WITHOUT PRESENCE OF ULCERS  *URINARY PROBLEMS  *BOWEL PROBLEMS  UNUSUAL RASH Items with * indicate a potential emergency and should be followed up as soon as possible.  Feel free to call the clinic you have any questions or concerns. The clinic phone number is [phone].

## 2013-05-28 NOTE — Progress Notes (Signed)
Nutrition followup completed with patient and wife in chemotherapy.  Patient is being treated for tongue cancer.  He is eating nothing by mouth.  Patient is tolerating 1-1/2 cans Osmolite 1.5-4 times a day via feeding tube with 120 mL of free water before and after each bolus feeding.  This provides 2130 calories, 89 g protein, and 2046 mL free water.  Labs reviewed.  Noted, glucose elevated at 205, magnesium within normal limits at 1.5, albumin decreased at 3.2, and BUN elevated at 33.1.  No intolerance issues mentioned.  Wife denies bowel issues.  Weight is stable and was documented as 173.4 pounds on April 14.  Nutrition diagnosis: Inadequate oral intake continues.  Intervention: Continue Osmolite 1.5 cans daily, providing, 100% caloric needs.  Patient to work to increase oral diet once healing occurs and swallowing improves.  Monitoring, evaluation, goals: Patient is tolerating tube feedings for weight maintenance.  Next visit: Wednesday, April 22, during chemotherapy.

## 2013-05-28 NOTE — Progress Notes (Signed)
Labs from 05/27/13 reviewed, labs within treatment parameters. Patient observed 1 hr post erbitux infusion. Infusion tolerated well.

## 2013-05-29 ENCOUNTER — Ambulatory Visit
Admission: RE | Admit: 2013-05-29 | Discharge: 2013-05-29 | Disposition: A | Discharge status: Home / Self Care | Payer: Medicare Other | Source: Ambulatory Visit | Attending: Radiation Oncology | Admitting: Radiation Oncology

## 2013-05-29 DIAGNOSIS — B977 Papillomavirus as the cause of diseases classified elsewhere: Secondary | ICD-10-CM | POA: Diagnosis not present

## 2013-05-29 DIAGNOSIS — R599 Enlarged lymph nodes, unspecified: Secondary | ICD-10-CM | POA: Diagnosis not present

## 2013-05-29 DIAGNOSIS — R799 Abnormal finding of blood chemistry, unspecified: Secondary | ICD-10-CM | POA: Diagnosis not present

## 2013-05-29 DIAGNOSIS — C01 Malignant neoplasm of base of tongue: Secondary | ICD-10-CM | POA: Diagnosis not present

## 2013-05-29 DIAGNOSIS — R21 Rash and other nonspecific skin eruption: Secondary | ICD-10-CM | POA: Diagnosis not present

## 2013-05-29 DIAGNOSIS — Z510 Encounter for antineoplastic radiation therapy: Secondary | ICD-10-CM | POA: Diagnosis not present

## 2013-05-29 NOTE — Progress Notes (Signed)
To provide support and care continuity, met with patient during weekly UT appt with Dr. Isidore Moos.  He did not express any concerns or needs; I encouraged him to contact me if that changes.  He verbalized he would.  Continuing navigation as L1 patient (new patient).  Gayleen Orem, RN, BSN, [facility] Head & Neck Oncology Navigator [phone]

## 2013-05-29 NOTE — Progress Notes (Signed)
To provide support and care continuity, met with patient during UT appt with Dr. Isidore Moos.  He denied any needs at this time.  I encouraged him to contact if that changes.  He verbalized understanding.  Continuing navigation as L2 patient (treatments established).  Gayleen Orem, RN, BSN, [facility] Head & Neck Oncology Navigator [phone]

## 2013-05-30 ENCOUNTER — Ambulatory Visit
Admission: RE | Admit: 2013-05-30 | Discharge: 2013-05-30 | Disposition: A | Discharge status: Home / Self Care | Payer: Medicare Other | Source: Ambulatory Visit | Attending: Radiation Oncology | Admitting: Radiation Oncology

## 2013-05-30 DIAGNOSIS — R799 Abnormal finding of blood chemistry, unspecified: Secondary | ICD-10-CM | POA: Diagnosis not present

## 2013-05-30 DIAGNOSIS — Z510 Encounter for antineoplastic radiation therapy: Secondary | ICD-10-CM | POA: Diagnosis not present

## 2013-05-30 DIAGNOSIS — R599 Enlarged lymph nodes, unspecified: Secondary | ICD-10-CM | POA: Diagnosis not present

## 2013-05-30 DIAGNOSIS — B977 Papillomavirus as the cause of diseases classified elsewhere: Secondary | ICD-10-CM | POA: Diagnosis not present

## 2013-05-30 DIAGNOSIS — C01 Malignant neoplasm of base of tongue: Secondary | ICD-10-CM | POA: Diagnosis not present

## 2013-05-30 DIAGNOSIS — R21 Rash and other nonspecific skin eruption: Secondary | ICD-10-CM | POA: Diagnosis not present

## 2013-06-02 ENCOUNTER — Encounter: Payer: Self-pay | Admitting: Radiation Oncology

## 2013-06-02 ENCOUNTER — Ambulatory Visit
Admission: RE | Admit: 2013-06-02 | Discharge: 2013-06-02 | Disposition: A | Discharge status: Home / Self Care | Payer: Medicare Other | Source: Ambulatory Visit | Attending: Radiation Oncology | Admitting: Radiation Oncology

## 2013-06-02 VITALS — BP 144/68 | HR 91 | Ht 74.0 in | Wt 173.0 lb

## 2013-06-02 DIAGNOSIS — R21 Rash and other nonspecific skin eruption: Secondary | ICD-10-CM | POA: Diagnosis not present

## 2013-06-02 DIAGNOSIS — Z510 Encounter for antineoplastic radiation therapy: Secondary | ICD-10-CM | POA: Diagnosis not present

## 2013-06-02 DIAGNOSIS — B977 Papillomavirus as the cause of diseases classified elsewhere: Secondary | ICD-10-CM | POA: Diagnosis not present

## 2013-06-02 DIAGNOSIS — C01 Malignant neoplasm of base of tongue: Secondary | ICD-10-CM

## 2013-06-02 DIAGNOSIS — R799 Abnormal finding of blood chemistry, unspecified: Secondary | ICD-10-CM | POA: Diagnosis not present

## 2013-06-02 DIAGNOSIS — R599 Enlarged lymph nodes, unspecified: Secondary | ICD-10-CM | POA: Diagnosis not present

## 2013-06-02 MED ORDER — SUCRALFATE 1 G PO TABS: ORAL_TABLET | ORAL | Status: DC

## 2013-06-02 NOTE — Progress Notes (Addendum)
Weekly Management Note:  outpatient Current Dose:  38Gy  Projected Dose: 70 Gy   Narrative:  The patient presents for routine under treatment assessment.  CBCT/MVCT images/Port film x-rays were reviewed.  The chart was checked. He has some pain, but it is tolerable. Using sucralfate occasionally.  Coughed today with Malachy Mood, but this is uncommon for him.  Not swallowing food/liquid, not doing SLP exercises. He sees medical oncology tomorrow. He has some thick secretions in his throat, tinged with blood  Physical Findings:  height is 6\' 2"  (1.88 m) and weight is 173 lb (78.472 kg). His blood pressure is 144/68 and his pulse is 91.  mild distress. Erythematous skin. Erythematous oropharynx. He has a small ulcer in the right buccal mucosa.  CBC    Component Value Date/Time   WBC 5.4 05/27/2013 1300   WBC 6.4 04/29/2013 0844   WBC 9.2 03/02/2013 1528   RBC 4.43 05/27/2013 1300   RBC 4.72 04/29/2013 0844   RBC 5.01 03/02/2013 1528   HGB 12.6* 05/27/2013 1300   HGB 13.7 04/29/2013 0844   HGB 14.4 03/02/2013 1528   HCT 38.4 05/27/2013 1300   HCT 40.7 04/29/2013 0844   HCT 46.8 03/02/2013 1528   PLT 185 05/27/2013 1300   PLT 183 04/29/2013 0844   MCV 86.6 05/27/2013 1300   MCV 86.2 04/29/2013 0844   MCV 93.5 03/02/2013 1528   MCH 28.4 05/27/2013 1300   MCH 29.0 04/29/2013 0844   MCH 28.7 03/02/2013 1528   MCHC 32.8 05/27/2013 1300   MCHC 33.7 04/29/2013 0844   MCHC 30.8* 03/02/2013 1528   RDW 14.6 05/27/2013 1300   RDW 14.2 04/29/2013 0844   LYMPHSABS 0.5* 05/27/2013 1300   LYMPHSABS 1.0 04/29/2013 0844   MONOABS 0.7 05/27/2013 1300   MONOABS 0.6 04/29/2013 0844   EOSABS 0.2 05/27/2013 1300   EOSABS 0.3 04/29/2013 0844   BASOSABS 0.0 05/27/2013 1300   BASOSABS 0.0 04/29/2013 0844    CMP     Component Value Date/Time   NA 137 05/27/2013 1300   NA 143 04/29/2013 0844   K 4.3 05/27/2013 1300   K 4.3 04/29/2013 0844   CL 103 04/29/2013 0844   CO2 27 05/27/2013 1300   CO2 29 04/29/2013 0844   GLUCOSE 205*  05/27/2013 1300   GLUCOSE 106* 04/29/2013 0844   BUN 33.1* 05/27/2013 1300   BUN 21 04/29/2013 0844   CREATININE 1.2 05/27/2013 1300   CREATININE 0.98 04/29/2013 0844   CALCIUM 8.9 05/27/2013 1300   CALCIUM 9.4 04/29/2013 0844   PROT 6.3* 05/27/2013 1300   PROT 6.7 04/29/2013 0844   ALBUMIN 3.2* 05/27/2013 1300   ALBUMIN 3.8 04/29/2013 0844   AST 27 05/27/2013 1300   AST 25 04/29/2013 0844   ALT 36 05/27/2013 1300   ALT 28 04/29/2013 0844   ALKPHOS 121 05/27/2013 1300   ALKPHOS 103 04/29/2013 0844   BILITOT 0.47 05/27/2013 1300   BILITOT 0.6 04/29/2013 0844   GFRNONAA 76* 04/29/2013 0844   GFRAA 88* 04/29/2013 0844     Impression:  The patient is tolerating radiotherapy with difficulty.  Plan:  Continue radiotherapy as planned. I told him to use his sucralfate 4 times a day. I urged him to perform his swallowing exercises to prevent long-term permanent dysphagia. I will get speech-language pathology back in touch with the patient. He will discuss pain medications tomorrow with medical oncology. We will pair him up with one of our head and neck survivors who can  provide encouragement and support  ________________________________   Eppie Gibson, M.D.

## 2013-06-02 NOTE — Progress Notes (Signed)
Mr Brent Franklin has received 19 fractions to his base of tongue and bilateral neck.  He is constantly coughing to the point that a temp was not obtained.  BP stable and weight stable for the past 2 - 3 weeks.  He is very quiet and is not articulating.  His oral mucosa is red with signs of mucositis in the back of his throat, and in the buccal mucosal regions.   Rash noted on face since the start of Erbitux.

## 2013-06-03 ENCOUNTER — Ambulatory Visit (HOSPITAL_BASED_OUTPATIENT_CLINIC_OR_DEPARTMENT_OTHER): Payer: Medicare Other | Admitting: Hematology and Oncology

## 2013-06-03 ENCOUNTER — Telehealth: Payer: Self-pay | Admitting: Hematology and Oncology

## 2013-06-03 ENCOUNTER — Encounter: Payer: Self-pay | Admitting: *Deleted

## 2013-06-03 ENCOUNTER — Ambulatory Visit
Admission: RE | Admit: 2013-06-03 | Discharge: 2013-06-03 | Disposition: A | Discharge status: Home / Self Care | Payer: Medicare Other | Source: Ambulatory Visit | Attending: Radiation Oncology | Admitting: Radiation Oncology

## 2013-06-03 ENCOUNTER — Other Ambulatory Visit: Payer: Self-pay | Admitting: Hematology and Oncology

## 2013-06-03 ENCOUNTER — Other Ambulatory Visit (HOSPITAL_BASED_OUTPATIENT_CLINIC_OR_DEPARTMENT_OTHER): Payer: Medicare Other

## 2013-06-03 VITALS — BP 133/49 | HR 83 | Temp 97.5°F | Resp 18 | Ht 74.0 in | Wt 175.0 lb

## 2013-06-03 DIAGNOSIS — Z510 Encounter for antineoplastic radiation therapy: Secondary | ICD-10-CM | POA: Diagnosis not present

## 2013-06-03 DIAGNOSIS — K1231 Oral mucositis (ulcerative) due to antineoplastic therapy: Secondary | ICD-10-CM

## 2013-06-03 DIAGNOSIS — R7309 Other abnormal glucose: Secondary | ICD-10-CM

## 2013-06-03 DIAGNOSIS — R070 Pain in throat: Secondary | ICD-10-CM

## 2013-06-03 DIAGNOSIS — K121 Other forms of stomatitis: Secondary | ICD-10-CM

## 2013-06-03 DIAGNOSIS — R1310 Dysphagia, unspecified: Secondary | ICD-10-CM | POA: Diagnosis not present

## 2013-06-03 DIAGNOSIS — E119 Type 2 diabetes mellitus without complications: Secondary | ICD-10-CM | POA: Insufficient documentation

## 2013-06-03 DIAGNOSIS — R599 Enlarged lymph nodes, unspecified: Secondary | ICD-10-CM | POA: Diagnosis not present

## 2013-06-03 DIAGNOSIS — C01 Malignant neoplasm of base of tongue: Secondary | ICD-10-CM

## 2013-06-03 DIAGNOSIS — R21 Rash and other nonspecific skin eruption: Secondary | ICD-10-CM | POA: Diagnosis not present

## 2013-06-03 DIAGNOSIS — R799 Abnormal finding of blood chemistry, unspecified: Secondary | ICD-10-CM | POA: Diagnosis not present

## 2013-06-03 DIAGNOSIS — K1239 Other oral mucositis (ulcerative): Secondary | ICD-10-CM

## 2013-06-03 DIAGNOSIS — B977 Papillomavirus as the cause of diseases classified elsewhere: Secondary | ICD-10-CM | POA: Diagnosis not present

## 2013-06-03 DIAGNOSIS — R739 Hyperglycemia, unspecified: Secondary | ICD-10-CM

## 2013-06-03 LAB — BASIC METABOLIC PANEL (CC13)
Anion Gap: 9 mEq/L (ref 3–11)
BUN: 25.1 mg/dL (ref 7.0–26.0)
CHLORIDE: 104 meq/L (ref 98–109)
CO2: 25 mEq/L (ref 22–29)
CREATININE: 0.9 mg/dL (ref 0.7–1.3)
Calcium: 8.7 mg/dL (ref 8.4–10.4)
Glucose: 270 mg/dl — ABNORMAL HIGH (ref 70–140)
Potassium: 4.3 mEq/L (ref 3.5–5.1)
Sodium: 138 mEq/L (ref 136–145)

## 2013-06-03 LAB — CBC WITH DIFFERENTIAL/PLATELET
BASO%: 0.2 % (ref 0.0–2.0)
BASOS ABS: 0 10*3/uL (ref 0.0–0.1)
EOS ABS: 0.2 10*3/uL (ref 0.0–0.5)
EOS%: 4 % (ref 0.0–7.0)
HCT: 34.7 % — ABNORMAL LOW (ref 38.4–49.9)
HGB: 11.6 g/dL — ABNORMAL LOW (ref 13.0–17.1)
LYMPH%: 11 % — ABNORMAL LOW (ref 14.0–49.0)
MCH: 28.2 pg (ref 27.2–33.4)
MCHC: 33.4 g/dL (ref 32.0–36.0)
MCV: 84.4 fL (ref 79.3–98.0)
MONO#: 0.6 10*3/uL (ref 0.1–0.9)
MONO%: 13.6 % (ref 0.0–14.0)
NEUT%: 71.2 % (ref 39.0–75.0)
NEUTROS ABS: 3 10*3/uL (ref 1.5–6.5)
NRBC: 0 % (ref 0–0)
PLATELETS: 171 10*3/uL (ref 140–400)
RBC: 4.11 10*6/uL — AB (ref 4.20–5.82)
RDW: 14 % (ref 11.0–14.6)
WBC: 4.2 10*3/uL (ref 4.0–10.3)
lymph#: 0.5 10*3/uL — ABNORMAL LOW (ref 0.9–3.3)

## 2013-06-03 LAB — MAGNESIUM (CC13): MAGNESIUM: 1.1 mg/dL — AB (ref 1.5–2.5)

## 2013-06-03 MED ORDER — MORPHINE SULFATE (CONCENTRATE) 20 MG/ML PO SOLN: 10.0000 mg | ORAL | Status: DC | PRN

## 2013-06-03 MED ORDER — FENTANYL 25 MCG/HR TD PT72
25.0000 ug | MEDICATED_PATCH | TRANSDERMAL | Status: DC
Start: 2013-06-03 — End: 2013-06-11

## 2013-06-03 NOTE — Progress Notes (Signed)
[facility] OFFICE PROGRESS NOTE  Patient Care Team: Tivis Ringer, MD as PCP - General (Internal Medicine) Brooks Sailors, RN as Registered Nurse (Oncology) Heath Lark, MD as Consulting Physician (Hematology and Oncology)  DIAGNOSIS: Base of tongue cancer, ongoing chemoradiation therapy  SUMMARY OF ONCOLOGIC HISTORY: Oncology History   Base of tongue cancer, HPV positive   Primary site: Lip and Oral Cavity (Bilateral)   Staging method: AJCC 7th Edition   Clinical: Stage IVA (T2, N2c, M0) signed by Heath Lark, MD on 04/18/2013  2:21 PM   Summary: Stage IVA (T2, N2c, M0)       Malignant neoplasm of base of tongue   04/01/2013 Imaging Ct scan of neck showed base of tongue cancer involving vallecula and bilateral LN metastasis   04/15/2013 Surgery Laryngoscopy and biopsy 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   05/05/2013 Procedure The patient had placement of port and feeding tube.   05/07/2013 -  Radiation Therapy The patient received radiation therapy   05/07/2013 -  Chemotherapy Patient received weekly cetuximab   05/14/2013 Adverse Reaction Grade 1 to 2 skin rash is noted    INTERVAL HISTORY: Brent Franklin 80 y.o. male returns for further followup. The patient is feeling miserable. He insisted that he has discomfort in his mouth but not pain. He denies any nausea, vomiting, change in bowel habits. He denies any worsening skin rash  haelsewhere except around his neck. I have reviewed the past medical history, past surgical history, social history and family history with the patient and they are unchanged from previous note.  ALLERGIES:  has No Known Allergies.  MEDICATIONS:  Current Outpatient Prescriptions  Medication Sig Dispense Refill  .  glipiZIDE (GLUCOTROL XL) 5 MG 24 hr tablet Take 5 mg by mouth daily.      Marland Kitchen HYDROcodone-acetaminophen (NORCO/VICODIN) 5-325 MG per tablet Take 1-2 tablets by mouth every 4 (four) hours as needed.  40 tablet  0  . ibuprofen (ADVIL,MOTRIN) 200 MG tablet Take 400 mg by mouth every 6 (six) hours as needed (pain).       Marland Kitchen lidocaine-prilocaine (EMLA) cream Apply 1 application topically as needed.  30 g  0  . metFORMIN (GLUCOPHAGE) 850 MG tablet Take 850 mg by mouth 2 (two) times daily with a meal.      . Multiple Vitamins-Minerals (PRESERVISION AREDS 2) CAPS Take 1 capsule by mouth daily.       . Nutritional Supplements (FEEDING SUPPLEMENT, OSMOLITE 1.5 CAL,) LIQD Begin one can of Osmolite 1.5 four times a day with 120 mL free water before and after each bolus.  Increase bolus feeds to 1-1/2 cans Osmolite 1.5 - four times a day. Please send formula and supplies.  1422 mL  0  . ondansetron (ZOFRAN) 8 MG tablet Take 1 tablet (8 mg total) by mouth every 8 (eight) hours as needed for nausea.  30 tablet  3  . promethazine (PHENERGAN) 25 MG tablet Take 1 tablet (25 mg total) by mouth every 6 (six) hours as needed for nausea or vomiting.  30 tablet  3  . sodium fluoride (FLUORISHIELD) 1.1 % GEL dental gel Place 1 application onto teeth at bedtime.      . sucralfate (CARAFATE) 1 G tablet Dissolve 1 tablet in 10 mL H20 and swallow 30 min  prior to meals and bedtime.      Marland Kitchen zolpidem (AMBIEN) 10 MG tablet Take 5 mg by mouth at bedtime as needed for sleep.      Marland Kitchen Alum & Mag Hydroxide-Simeth (MAGIC MOUTHWASH W/LIDOCAINE) SOLN 1part nystatin,1part Maaloxplus,1part benadryl,3part 2%viscous lidocaine. Swish/swallow 10 mL up to QID, 63min before meals/bedtime  480 mL  5  . fentaNYL (DURAGESIC - DOSED MCG/HR) 25 MCG/HR patch Place 1 patch (25 mcg total) onto the skin every 3 (three) days.  5 patch  0  . morphine (ROXANOL) 20 MG/ML concentrated solution Take 0.5 mLs (10 mg total) by mouth every 2 (two) hours as needed for  severe pain.  120 mL  0   No current facility-administered medications for this visit.    REVIEW OF SYSTEMS:   Constitutional: Denies fevers, chills or abnormal weight loss Eyes: Denies blurriness of vision Respiratory: Denies cough, dyspnea or wheezes Cardiovascular: Denies palpitation, chest discomfort or lower extremity swelling Gastrointestinal:  Denies nausea, heartburn or change in bowel habits Lymphatics: Denies new lymphadenopathy or easy bruising Neurological:Denies numbness, tingling or new weaknesses Behavioral/Psych: Mood is stable, no new changes  All other systems were reviewed with the patient and are negative.  PHYSICAL EXAMINATION: ECOG PERFORMANCE STATUS: 1 - Symptomatic but completely ambulatory  Filed Vitals:   06/03/13 1333  BP: 133/49  Pulse: 83  Temp: 97.5 F (36.4 C)  Resp: 18   Filed Weights   06/03/13 1333  Weight: 175 lb (79.379 kg)    GENERAL:alert,  appears uncomfortable and in distress  SKIN:  the skin rash around his neck is worse than previous examination.  EYES: normal, Conjunctiva are pink and non-injected, sclera clear OROPHARYNX: significant mucositis with ulceration in the oral pharynx region. No thrush   NECK: supple, thyroid normal size, non-tender, without nodularity LYMPH:   previously palpable lymphadenopathy in the neck has resolved.  LUNGS: clear to auscultation and percussion with normal breathing effort HEART: regular rate & rhythm and no murmurs and no lower extremity edema ABDOMEN:abdomen soft, non-tender and normal bowel sounds. Feeding tube site looks okay  Musculoskeletal:no cyanosis of digits and no clubbing  NEURO: alert & oriented x 3 with fluent speech, no focal motor/sensory deficits  LABORATORY DATA:  I have reviewed the data as listed    Component Value Date/Time   NA 138 06/03/2013 1321   NA 143 04/29/2013 0844   K 4.3 06/03/2013 1321   K 4.3 04/29/2013 0844   CL 103 04/29/2013 0844   CO2 25 06/03/2013 1321    CO2 29 04/29/2013 0844   GLUCOSE 270* 06/03/2013 1321   GLUCOSE 106* 04/29/2013 0844   BUN 25.1 06/03/2013 1321   BUN 21 04/29/2013 0844   CREATININE 0.9 06/03/2013 1321   CREATININE 0.98 04/29/2013 0844   CALCIUM 8.7 06/03/2013 1321   CALCIUM 9.4 04/29/2013 0844   PROT 6.3* 05/27/2013 1300   PROT 6.7 04/29/2013 0844   ALBUMIN 3.2* 05/27/2013 1300   ALBUMIN 3.8 04/29/2013 0844   AST 27 05/27/2013 1300   AST 25 04/29/2013 0844   ALT 36 05/27/2013 1300   ALT 28 04/29/2013 0844   ALKPHOS 121 05/27/2013 1300   ALKPHOS 103 04/29/2013 0844   BILITOT 0.47 05/27/2013 1300   BILITOT 0.6 04/29/2013 0844   GFRNONAA 76* 04/29/2013 0844   GFRAA 88* 04/29/2013 0844    No results found for this basename: SPEP,  UPEP,   kappa and lambda light chains    Lab Results  Component Value Date  WBC 5.4 05/27/2013   NEUTROABS 4.0 05/27/2013   HGB 12.6* 05/27/2013   HCT 38.4 05/27/2013   MCV 86.6 05/27/2013   PLT 185 05/27/2013      Chemistry      Component Value Date/Time   NA 138 06/03/2013 1321   NA 143 04/29/2013 0844   K 4.3 06/03/2013 1321   K 4.3 04/29/2013 0844   CL 103 04/29/2013 0844   CO2 25 06/03/2013 1321   CO2 29 04/29/2013 0844   BUN 25.1 06/03/2013 1321   BUN 21 04/29/2013 0844   CREATININE 0.9 06/03/2013 1321   CREATININE 0.98 04/29/2013 0844      Component Value Date/Time   CALCIUM 8.7 06/03/2013 1321   CALCIUM 9.4 04/29/2013 0844   ALKPHOS 121 05/27/2013 1300   ALKPHOS 103 04/29/2013 0844   AST 27 05/27/2013 1300   AST 25 04/29/2013 0844   ALT 36 05/27/2013 1300   ALT 28 04/29/2013 0844   BILITOT 0.47 05/27/2013 1300   BILITOT 0.6 04/29/2013 0844     ASSESSMENT & PLAN:  #1 Tongue cancer He is experiencing worsening side effects this week compared to last week. The patient is insistent that he is doing well. He is fairly reluctant to take prescription pain medications. I will continue to see him on a weekly basis. #2 Skin rash This is due to side-effects of Cetuximab. I recommend Benadyl prn for  itching and hydrocortisone cream. If it gets worse, he will start minocycline and topical antibiotic cream #3 worsening mucositis with ulceration and severe dysphagia After a prolonged discussion, he finally accepted prescription fentanyl patch and liquid morphine. He did not think it would be helpful. I reassured him that he will not get addicted to the medicine. It may provide some comfort. He will continue followup next week with speech and language therapist. #4 low magnesium This is expected side effects from cetuximab. I would give you infusion medicine and recommend he take oral supplement as well #5 hyperglycemia  The patient will continue to take his current medications for diabetes  I recommend IV fluids for hydration but the patient declined.  Orders Placed This Encounter  Procedures  . Comprehensive metabolic panel    Standing Status: Standing     Number of Occurrences: 9     Standing Expiration Date: 06/04/2014  . CBC with Differential    Standing Status: Standing     Number of Occurrences: 9     Standing Expiration Date: 06/04/2014  . Magnesium    Standing Status: Standing     Number of Occurrences: 9     Standing Expiration Date: 06/04/2014   All questions were answered. The patient knows to call the clinic with any problems, questions or concerns. No barriers to learning was detected. I spent 40 minutes counseling the patient face to face. The total time spent in the appointment was 55 minutes and more than 50% was on counseling and review of test results     Heath Lark, MD 06/03/2013 2:18 PM

## 2013-06-03 NOTE — Telephone Encounter (Signed)
gv and printed appt sched nad avs for pt for April adn May

## 2013-06-03 NOTE — Progress Notes (Signed)
To provide support, encouragement and care continuity, met with patient during scheduled UT with Dr. Alvy Bimler.  Continuing navigation as L2 patient (treatments established).  Gayleen Orem, RN, BSN, [facility] Head & Neck Oncology Navigator [phone]

## 2013-06-03 NOTE — Progress Notes (Signed)
Met with patient after RT, relayed to him per Dr. Calton Dach guidance that Mg is low.  She recommends magnesium oxide 400 mg BID; available OTC or by Rx; crush, dissolve, deliver by g-tube.  She is adding Mg to his fluids when he comes for chemo.  He verbalized understanding.  Gayleen Orem, RN, BSN, [facility] Head & Neck Oncology Navigator [phone]

## 2013-06-04 ENCOUNTER — Ambulatory Visit (HOSPITAL_BASED_OUTPATIENT_CLINIC_OR_DEPARTMENT_OTHER): Payer: Medicare Other

## 2013-06-04 ENCOUNTER — Ambulatory Visit: Payer: Medicare Other | Admitting: Nutrition

## 2013-06-04 ENCOUNTER — Ambulatory Visit
Admission: RE | Admit: 2013-06-04 | Discharge: 2013-06-04 | Disposition: A | Discharge status: Home / Self Care | Payer: Medicare Other | Source: Ambulatory Visit | Attending: Radiation Oncology | Admitting: Radiation Oncology

## 2013-06-04 VITALS — BP 131/63 | HR 84 | Temp 98.1°F | Resp 48

## 2013-06-04 DIAGNOSIS — Z510 Encounter for antineoplastic radiation therapy: Secondary | ICD-10-CM | POA: Diagnosis not present

## 2013-06-04 DIAGNOSIS — C01 Malignant neoplasm of base of tongue: Secondary | ICD-10-CM | POA: Diagnosis not present

## 2013-06-04 DIAGNOSIS — B977 Papillomavirus as the cause of diseases classified elsewhere: Secondary | ICD-10-CM | POA: Diagnosis not present

## 2013-06-04 DIAGNOSIS — R21 Rash and other nonspecific skin eruption: Secondary | ICD-10-CM | POA: Diagnosis not present

## 2013-06-04 DIAGNOSIS — R799 Abnormal finding of blood chemistry, unspecified: Secondary | ICD-10-CM | POA: Diagnosis not present

## 2013-06-04 DIAGNOSIS — Z5112 Encounter for antineoplastic immunotherapy: Secondary | ICD-10-CM

## 2013-06-04 DIAGNOSIS — R599 Enlarged lymph nodes, unspecified: Secondary | ICD-10-CM | POA: Diagnosis not present

## 2013-06-04 MED ORDER — DIPHENHYDRAMINE HCL 50 MG/ML IJ SOLN
INTRAMUSCULAR | Status: AC
  Filled 2013-06-04: qty 1

## 2013-06-04 MED ORDER — MAGNESIUM SULFATE 50 % IJ SOLN
2.0000 g | Freq: Once | INTRAVENOUS | Status: DC
  Filled 2013-06-04: qty 4

## 2013-06-04 MED ORDER — BIAFINE EX EMUL
Freq: Two times a day (BID) | CUTANEOUS | Status: AC
  Administered 2013-06-04: 11:00:00 via TOPICAL

## 2013-06-04 MED ORDER — DIPHENHYDRAMINE HCL 50 MG/ML IJ SOLN
12.5000 mg | Freq: Once | INTRAMUSCULAR | Status: AC
  Administered 2013-06-04: 12.5 mg via INTRAVENOUS

## 2013-06-04 MED ORDER — HEPARIN SOD (PORK) LOCK FLUSH 100 UNIT/ML IV SOLN
500.0000 [IU] | Freq: Once | INTRAVENOUS | Status: AC | PRN
  Administered 2013-06-04: 500 [IU]
  Filled 2013-06-04: qty 5

## 2013-06-04 MED ORDER — SODIUM CHLORIDE 0.9 % IV SOLN
Freq: Once | INTRAVENOUS | Status: AC
  Administered 2013-06-04: 13:00:00 via INTRAVENOUS
  Filled 2013-06-04: qty 250

## 2013-06-04 MED ORDER — CETUXIMAB CHEMO IV INJECTION 200 MG/100ML
250.0000 mg/m2 | Freq: Once | INTRAVENOUS | Status: AC
  Administered 2013-06-04: 500 mg via INTRAVENOUS
  Filled 2013-06-04: qty 250

## 2013-06-04 MED ORDER — SODIUM CHLORIDE 0.9 % IJ SOLN
10.0000 mL | INTRAMUSCULAR | Status: DC | PRN
  Administered 2013-06-04: 10 mL
  Filled 2013-06-04: qty 10

## 2013-06-04 MED ORDER — SODIUM CHLORIDE 0.9 % IV SOLN
Freq: Once | INTRAVENOUS | Status: AC
  Administered 2013-06-04: 11:00:00 via INTRAVENOUS

## 2013-06-04 NOTE — Patient Instructions (Signed)
[facility] Discharge Instructions for Patients Receiving Chemotherapy  Today you received the following chemotherapy agents: Erbitux. To help prevent nausea and vomiting after your treatment, we encourage you to take your nausea medication.   If you develop nausea and vomiting that is not controlled by your nausea medication, call the clinic.   BELOW ARE SYMPTOMS THAT SHOULD BE REPORTED IMMEDIATELY:  *FEVER GREATER THAN 100.5 F  *CHILLS WITH OR WITHOUT FEVER  NAUSEA AND VOMITING THAT IS NOT CONTROLLED WITH YOUR NAUSEA MEDICATION  *UNUSUAL SHORTNESS OF BREATH  *UNUSUAL BRUISING OR BLEEDING  TENDERNESS IN MOUTH AND THROAT WITH OR WITHOUT PRESENCE OF ULCERS  *URINARY PROBLEMS  *BOWEL PROBLEMS  UNUSUAL RASH Items with * indicate a potential emergency and should be followed up as soon as possible.  Feel free to call the clinic you have any questions or concerns. The clinic phone number is [phone].    

## 2013-06-04 NOTE — Progress Notes (Signed)
Brief followup with both patient and wife today.  Patient being treated for tongue cancer.  He finds it difficult to communicate, so speaks softly.  Per wife, patient continues to tolerate 6 cans of Osmolite 1.5 at goal rate.  Weight documented as 175 pounds on April 20.  Overall weight is stable. No nutrition complaints.  Nutrition diagnosis: Inadequate oral intake continues.  Intervention: Patient to continue Osmolite 1.5, 6 cans daily, to provide 100% of caloric needs.  Patient will begin oral intake, once healing occurs and swallowing improves.  Monitoring, evaluation, goals: Patient will continue to tolerate tube feeding at goal rate for weight maintenance.  Next visit: Wednesday, April 29, during chemotherapy.

## 2013-06-05 ENCOUNTER — Telehealth: Payer: Self-pay | Admitting: *Deleted

## 2013-06-05 ENCOUNTER — Ambulatory Visit
Admission: RE | Admit: 2013-06-05 | Discharge: 2013-06-05 | Disposition: A | Discharge status: Home / Self Care | Payer: Medicare Other | Source: Ambulatory Visit | Attending: Radiation Oncology | Admitting: Radiation Oncology

## 2013-06-05 DIAGNOSIS — R21 Rash and other nonspecific skin eruption: Secondary | ICD-10-CM | POA: Diagnosis not present

## 2013-06-05 DIAGNOSIS — R599 Enlarged lymph nodes, unspecified: Secondary | ICD-10-CM | POA: Diagnosis not present

## 2013-06-05 DIAGNOSIS — R799 Abnormal finding of blood chemistry, unspecified: Secondary | ICD-10-CM | POA: Diagnosis not present

## 2013-06-05 DIAGNOSIS — C01 Malignant neoplasm of base of tongue: Secondary | ICD-10-CM | POA: Diagnosis not present

## 2013-06-05 DIAGNOSIS — B977 Papillomavirus as the cause of diseases classified elsewhere: Secondary | ICD-10-CM | POA: Diagnosis not present

## 2013-06-05 DIAGNOSIS — Z510 Encounter for antineoplastic radiation therapy: Secondary | ICD-10-CM | POA: Diagnosis not present

## 2013-06-05 NOTE — Telephone Encounter (Signed)
[facility] called to let Dr Alvy Bimler know that Dr Gaspar Bidding does not want the nurses to come every week.   [place] calls the patient the night before the visit and he tells them if he needs them to come. So far, they have been 1 time.

## 2013-06-06 ENCOUNTER — Ambulatory Visit
Admission: RE | Admit: 2013-06-06 | Discharge: 2013-06-06 | Disposition: A | Discharge status: Home / Self Care | Payer: Medicare Other | Source: Ambulatory Visit | Attending: Radiation Oncology | Admitting: Radiation Oncology

## 2013-06-06 DIAGNOSIS — C01 Malignant neoplasm of base of tongue: Secondary | ICD-10-CM | POA: Diagnosis not present

## 2013-06-06 DIAGNOSIS — R21 Rash and other nonspecific skin eruption: Secondary | ICD-10-CM | POA: Diagnosis not present

## 2013-06-06 DIAGNOSIS — Z510 Encounter for antineoplastic radiation therapy: Secondary | ICD-10-CM | POA: Diagnosis not present

## 2013-06-06 DIAGNOSIS — R799 Abnormal finding of blood chemistry, unspecified: Secondary | ICD-10-CM | POA: Diagnosis not present

## 2013-06-06 DIAGNOSIS — R599 Enlarged lymph nodes, unspecified: Secondary | ICD-10-CM | POA: Diagnosis not present

## 2013-06-06 DIAGNOSIS — B977 Papillomavirus as the cause of diseases classified elsewhere: Secondary | ICD-10-CM | POA: Diagnosis not present

## 2013-06-09 ENCOUNTER — Other Ambulatory Visit (HOSPITAL_BASED_OUTPATIENT_CLINIC_OR_DEPARTMENT_OTHER): Payer: Medicare Other

## 2013-06-09 ENCOUNTER — Ambulatory Visit: Payer: Medicare Other

## 2013-06-09 ENCOUNTER — Ambulatory Visit
Admission: RE | Admit: 2013-06-09 | Discharge: 2013-06-09 | Disposition: A | Discharge status: Home / Self Care | Payer: Medicare Other | Source: Ambulatory Visit | Attending: Radiation Oncology | Admitting: Radiation Oncology

## 2013-06-09 ENCOUNTER — Ambulatory Visit (HOSPITAL_BASED_OUTPATIENT_CLINIC_OR_DEPARTMENT_OTHER): Payer: Medicare Other | Admitting: Hematology and Oncology

## 2013-06-09 ENCOUNTER — Encounter: Payer: Self-pay | Admitting: Radiation Oncology

## 2013-06-09 ENCOUNTER — Telehealth: Payer: Self-pay | Admitting: Hematology and Oncology

## 2013-06-09 ENCOUNTER — Other Ambulatory Visit: Payer: Self-pay | Admitting: *Deleted

## 2013-06-09 ENCOUNTER — Encounter: Payer: Self-pay | Admitting: *Deleted

## 2013-06-09 VITALS — BP 136/70 | HR 84 | Temp 99.1°F | Ht 74.0 in | Wt 175.1 lb

## 2013-06-09 VITALS — BP 130/69 | HR 98 | Temp 98.1°F | Resp 18 | Ht 74.0 in | Wt 175.1 lb

## 2013-06-09 DIAGNOSIS — R799 Abnormal finding of blood chemistry, unspecified: Secondary | ICD-10-CM | POA: Diagnosis not present

## 2013-06-09 DIAGNOSIS — C01 Malignant neoplasm of base of tongue: Secondary | ICD-10-CM | POA: Diagnosis not present

## 2013-06-09 DIAGNOSIS — K1370 Unspecified lesions of oral mucosa: Secondary | ICD-10-CM | POA: Diagnosis not present

## 2013-06-09 DIAGNOSIS — R7989 Other specified abnormal findings of blood chemistry: Secondary | ICD-10-CM

## 2013-06-09 DIAGNOSIS — K1230 Oral mucositis (ulcerative), unspecified: Secondary | ICD-10-CM | POA: Diagnosis not present

## 2013-06-09 DIAGNOSIS — Z5189 Encounter for other specified aftercare: Secondary | ICD-10-CM | POA: Diagnosis not present

## 2013-06-09 DIAGNOSIS — K121 Other forms of stomatitis: Secondary | ICD-10-CM

## 2013-06-09 DIAGNOSIS — Z510 Encounter for antineoplastic radiation therapy: Secondary | ICD-10-CM | POA: Diagnosis not present

## 2013-06-09 DIAGNOSIS — R599 Enlarged lymph nodes, unspecified: Secondary | ICD-10-CM | POA: Diagnosis not present

## 2013-06-09 DIAGNOSIS — B977 Papillomavirus as the cause of diseases classified elsewhere: Secondary | ICD-10-CM | POA: Diagnosis not present

## 2013-06-09 DIAGNOSIS — R1310 Dysphagia, unspecified: Secondary | ICD-10-CM

## 2013-06-09 DIAGNOSIS — B370 Candidal stomatitis: Secondary | ICD-10-CM | POA: Diagnosis not present

## 2013-06-09 DIAGNOSIS — R7309 Other abnormal glucose: Secondary | ICD-10-CM

## 2013-06-09 DIAGNOSIS — R21 Rash and other nonspecific skin eruption: Secondary | ICD-10-CM | POA: Diagnosis not present

## 2013-06-09 DIAGNOSIS — R070 Pain in throat: Secondary | ICD-10-CM

## 2013-06-09 LAB — COMPREHENSIVE METABOLIC PANEL (CC13)
ALBUMIN: 2.8 g/dL — AB (ref 3.5–5.0)
ALT: 85 U/L — AB (ref 0–55)
ANION GAP: 9 meq/L (ref 3–11)
AST: 49 U/L — ABNORMAL HIGH (ref 5–34)
Alkaline Phosphatase: 178 U/L — ABNORMAL HIGH (ref 40–150)
BILIRUBIN TOTAL: 0.49 mg/dL (ref 0.20–1.20)
BUN: 22.8 mg/dL (ref 7.0–26.0)
CHLORIDE: 100 meq/L (ref 98–109)
CO2: 27 meq/L (ref 22–29)
Calcium: 8.7 mg/dL (ref 8.4–10.4)
Creatinine: 0.9 mg/dL (ref 0.7–1.3)
Glucose: 300 mg/dl — ABNORMAL HIGH (ref 70–140)
POTASSIUM: 4.5 meq/L (ref 3.5–5.1)
SODIUM: 136 meq/L (ref 136–145)
TOTAL PROTEIN: 5.9 g/dL — AB (ref 6.4–8.3)

## 2013-06-09 LAB — CBC WITH DIFFERENTIAL/PLATELET
BASO%: 0.1 % (ref 0.0–2.0)
Basophils Absolute: 0 10*3/uL (ref 0.0–0.1)
EOS%: 2.7 % (ref 0.0–7.0)
Eosinophils Absolute: 0.2 10*3/uL (ref 0.0–0.5)
HEMATOCRIT: 37 % — AB (ref 38.4–49.9)
HGB: 12.2 g/dL — ABNORMAL LOW (ref 13.0–17.1)
LYMPH#: 0.6 10*3/uL — AB (ref 0.9–3.3)
LYMPH%: 8.9 % — ABNORMAL LOW (ref 14.0–49.0)
MCH: 28.1 pg (ref 27.2–33.4)
MCHC: 32.8 g/dL (ref 32.0–36.0)
MCV: 85.6 fL (ref 79.3–98.0)
MONO#: 0.7 10*3/uL (ref 0.1–0.9)
MONO%: 10 % (ref 0.0–14.0)
NEUT#: 5.6 10*3/uL (ref 1.5–6.5)
NEUT%: 78.3 % — AB (ref 39.0–75.0)
Platelets: 196 10*3/uL (ref 140–400)
RBC: 4.32 10*6/uL (ref 4.20–5.82)
RDW: 14.4 % (ref 11.0–14.6)
WBC: 7.1 10*3/uL (ref 4.0–10.3)

## 2013-06-09 LAB — MAGNESIUM (CC13): Magnesium: 1.2 mg/dl — CL (ref 1.5–2.5)

## 2013-06-09 MED ORDER — FLUCONAZOLE 100 MG PO TABS
100.0000 mg | ORAL_TABLET | Freq: Every day | ORAL | Status: DC
Start: 2013-06-09 — End: 2013-06-16

## 2013-06-09 NOTE — Progress Notes (Addendum)
.    Brent Franklin has received 24 fractions to his base of tongue and bilateral neck.  He has a rash-like appearance to his face and neck with bright erythema and dyness of his neck.Marland Kitchen He is using Biafine as instructed.Unable to grade but states no pain.    "Just miserable".  Oral mucosa red and dry with whitish area on his tongue.  Diflucan script given today. Thickened saliva.  Vitls are stable.  Instilling 5-6 cans of Osmolite via PEG daily.

## 2013-06-09 NOTE — Progress Notes (Signed)
[facility] OFFICE PROGRESS NOTE  Patient Care Team: Tivis Ringer, MD as PCP - General (Internal Medicine) Brooks Sailors, RN as Registered Nurse (Oncology) Heath Lark, MD as Consulting Physician (Hematology and Oncology)  DIAGNOSIS: Base of tongue cancer, seen prior to cycle 6 of treatment  SUMMARY OF ONCOLOGIC HISTORY: Oncology History   Base of tongue cancer, HPV positive   Primary site: Lip and Oral Cavity (Bilateral)   Staging method: AJCC 7th Edition   Clinical: Stage IVA (T2, N2c, M0) signed by Heath Lark, MD on 04/18/2013  2:21 PM   Summary: Stage IVA (T2, N2c, M0)       Malignant neoplasm of base of tongue   04/01/2013 Imaging Ct scan of neck showed base of tongue cancer involving vallecula and bilateral LN metastasis   04/15/2013 Surgery Laryngoscopy and biopsy 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   05/05/2013 Procedure The patient had placement of port and feeding tube.   05/07/2013 -  Radiation Therapy The patient received radiation therapy   05/07/2013 -  Chemotherapy Patient received weekly cetuximab   05/14/2013 Adverse Reaction Grade 1 to 2 skin rash is noted    INTERVAL HISTORY: Brent Franklin 80 y.o. male returns for further followup. The patient is very miserable. He insisted he has discomfort and not pain. He was started on fentanyl patch last week. He felt that fentanyl patch is taking the edge off. He has not used liquid morphine for pain. He has significant swallowing difficulties. He was started on magnesium supplement last week for low magnesium.  I have reviewed the past medical history, past surgical history, social history and family history with the patient and they are unchanged from previous note.  ALLERGIES:  has  No Known Allergies.  MEDICATIONS:  Current Outpatient Prescriptions  Medication Sig Dispense Refill  . Alum & Mag Hydroxide-Simeth (MAGIC MOUTHWASH W/LIDOCAINE) SOLN 1part nystatin,1part Maaloxplus,1part benadryl,3part 2%viscous lidocaine. Swish/swallow 10 mL up to QID, 49min before meals/bedtime  480 mL  5  . fentaNYL (DURAGESIC - DOSED MCG/HR) 25 MCG/HR patch Place 1 patch (25 mcg total) onto the skin every 3 (three) days.  5 patch  0  . glipiZIDE (GLUCOTROL XL) 5 MG 24 hr tablet Take 5 mg by mouth daily.      Marland Kitchen lidocaine-prilocaine (EMLA) cream Apply 1 application topically as needed.  30 g  0  . metFORMIN (GLUCOPHAGE) 850 MG tablet Take 850 mg by mouth 2 (two) times daily with a meal.      . minocycline (MINOCIN,DYNACIN) 100 MG capsule Place 100 mg into feeding tube 2 (two) times daily.      Marland Kitchen morphine (ROXANOL) 20 MG/ML concentrated solution Take 0.5 mLs (10 mg total) by mouth every 2 (two) hours as needed for severe pain.  120 mL  0  . Multiple Vitamins-Minerals (PRESERVISION AREDS 2) CAPS Take 1 capsule by mouth daily.       . Nutritional Supplements (FEEDING SUPPLEMENT, OSMOLITE 1.5 CAL,) LIQD Begin one can of Osmolite 1.5 four times a day with 120 mL free water before and after each bolus.  Increase bolus feeds to 1-1/2 cans Osmolite 1.5 - four times a day. Please send formula and supplies.  1422 mL  0  . ondansetron (ZOFRAN) 8 MG tablet Take 1 tablet (8 mg total) by  mouth every 8 (eight) hours as needed for nausea.  30 tablet  3  . promethazine (PHENERGAN) 25 MG tablet Take 1 tablet (25 mg total) by mouth every 6 (six) hours as needed for nausea or vomiting.  30 tablet  3  . sodium fluoride (FLUORISHIELD) 1.1 % GEL dental gel Place 1 application onto teeth at bedtime.      . sucralfate (CARAFATE) 1 G tablet Dissolve 1 tablet in 10 mL H20 and swallow 30 min prior to meals and bedtime.      . fluconazole (DIFLUCAN) 100 MG tablet Take 1 tablet (100 mg total) by mouth daily.  7 tablet  0  .  zolpidem (AMBIEN) 10 MG tablet Take 5 mg by mouth at bedtime as needed for sleep.       No current facility-administered medications for this visit.   Facility-Administered Medications Ordered in Other Visits  Medication Dose Route Frequency Provider Last Rate Last Dose  . topical emolient (BIAFINE) emulsion   Topical BID Eppie Gibson, MD        REVIEW OF SYSTEMS:   Constitutional: Denies fevers, chills  Eyes: Denies blurriness of vision Respiratory: Denies cough, dyspnea or wheezes Cardiovascular: Denies palpitation, chest discomfort or lower extremity swelling Gastrointestinal:  Denies nausea, heartburn or change in bowel habits Lymphatics: Denies new lymphadenopathy or easy bruising Neurological:Denies numbness, tingling or new weaknesses Behavioral/Psych: Mood is stable, no new changes  All other systems were reviewed with the patient and are negative.  PHYSICAL EXAMINATION: ECOG PERFORMANCE STATUS: 1 - Symptomatic but completely ambulatory  Filed Vitals:   06/09/13 1317  BP: 130/69  Pulse: 98  Temp: 98.1 F (36.7 C)  Resp: 18   Filed Weights   06/09/13 1317  Weight: 175 lb 1.6 oz (79.425 kg)    GENERAL:alert, in moderate distress and and he appears uncomfortable SKIN: Significant skin rash with ulceration on his face. EYES: normal, Conjunctiva are pink and non-injected, sclera clear OROPHARYNX: Significant ulcerated mucositis. There is thrush NECK: supple, thyroid normal size, non-tender, without nodularity LYMPH:  The previously palpable lymphadenopathy is shrinking.  LUNGS: clear to auscultation and percussion with normal breathing effort HEART: regular rate & rhythm and no murmurs and no lower extremity edema ABDOMEN:abdomen soft, non-tender and normal bowel sounds Musculoskeletal:no cyanosis of digits and no clubbing  NEURO: alert & oriented x 3 with fluent speech, no focal motor/sensory deficits  LABORATORY DATA:  I have reviewed the data as listed     Component Value Date/Time   NA 136 06/09/2013 1258   NA 143 04/29/2013 0844   K 4.5 06/09/2013 1258   K 4.3 04/29/2013 0844   CL 103 04/29/2013 0844   CO2 27 06/09/2013 1258   CO2 29 04/29/2013 0844   GLUCOSE 300* 06/09/2013 1258   GLUCOSE 106* 04/29/2013 0844   BUN 22.8 06/09/2013 1258   BUN 21 04/29/2013 0844   CREATININE 0.9 06/09/2013 1258   CREATININE 0.98 04/29/2013 0844   CALCIUM 8.7 06/09/2013 1258   CALCIUM 9.4 04/29/2013 0844   PROT 5.9* 06/09/2013 1258   PROT 6.7 04/29/2013 0844   ALBUMIN 2.8* 06/09/2013 1258   ALBUMIN 3.8 04/29/2013 0844   AST 49* 06/09/2013 1258   AST 25 04/29/2013 0844   ALT 85* 06/09/2013 1258   ALT 28 04/29/2013 0844   ALKPHOS 178* 06/09/2013 1258   ALKPHOS 103 04/29/2013 0844   BILITOT 0.49 06/09/2013 1258   BILITOT 0.6 04/29/2013 0844   GFRNONAA 76* 04/29/2013 0844   GFRAA 88*  04/29/2013 0844    No results found for this basename: SPEP,  UPEP,   kappa and lambda light chains    Lab Results  Component Value Date   WBC 7.1 06/09/2013   NEUTROABS 5.6 06/09/2013   HGB 12.2* 06/09/2013   HCT 37.0* 06/09/2013   MCV 85.6 06/09/2013   PLT 196 06/09/2013      Chemistry      Component Value Date/Time   NA 136 06/09/2013 1258   NA 143 04/29/2013 0844   K 4.5 06/09/2013 1258   K 4.3 04/29/2013 0844   CL 103 04/29/2013 0844   CO2 27 06/09/2013 1258   CO2 29 04/29/2013 0844   BUN 22.8 06/09/2013 1258   BUN 21 04/29/2013 0844   CREATININE 0.9 06/09/2013 1258   CREATININE 0.98 04/29/2013 0844      Component Value Date/Time   CALCIUM 8.7 06/09/2013 1258   CALCIUM 9.4 04/29/2013 0844   ALKPHOS 178* 06/09/2013 1258   ALKPHOS 103 04/29/2013 0844   AST 49* 06/09/2013 1258   AST 25 04/29/2013 0844   ALT 85* 06/09/2013 1258   ALT 28 04/29/2013 0844   BILITOT 0.49 06/09/2013 1258   BILITOT 0.6 04/29/2013 0844       ASSESSMENT & PLAN:  #1 Tongue cancer He is experiencing worsening side effects this week compared to last week. He is fairly reluctant to take prescription pain  medications. I will continue to see him on a weekly basis. #2 Skin rash This is due to side-effects of Cetuximab. I recommend Benadyl prn for itching and hydrocortisone cream. He has started on minocycline and topical antibiotic cream #3 worsening mucositis with ulceration and severe dysphagia After a prolonged discussion, he wants to continue on current prescription dose of fentanyl patch. I encouraged him to take the liquid morphine as prescribed.  He is reluctant to take liquid morphine.  He will continue followup next week with speech and language therapist. #4 low magnesium This is expected side effects from cetuximab. I would give you infusion medicine and recommend he take oral supplement as well #5 hyperglycemia  The patient will continue to take his current medications for diabetes #6 mildly elevated liver function tests This is likely related to hepatic congestion from high blood sugar. I will monitor closely. #7 thrush I gave him prescription fluconazole to take for one week.   All questions were answered. The patient knows to call the clinic with any problems, questions or concerns. No barriers to learning was detected. I spent 40 minutes counseling the patient face to face. The total time spent in the appointment was 55 minutes and more than 50% was on counseling and review of test results     Heath Lark, MD 06/09/2013 2:02 PM

## 2013-06-09 NOTE — Progress Notes (Signed)
Met with patient during UT appt with Dr. Alvy Bimler to provide support, encouragement and care continuity.  Spoke with patient wife after appt, provided her encouragement.  Gayleen Orem, RN, BSN, [facility] Head & Neck Oncology Navigator [phone]

## 2013-06-09 NOTE — Progress Notes (Signed)
   Weekly Management Note:  outpatient Current Dose:  48 Gy  Projected Dose: 70 Gy   Narrative:  The patient presents for routine under treatment assessment.  CBCT/MVCT images/Port film x-rays were reviewed.  The chart was checked. He is using Biafine as instructed. Unable to grade but states no pain. On fentanyl.   Diflucan script given today by med/onc. Thickened saliva.   Instilling 5-6 cans of Osmolite via PEG daily. Wears scatter guards. Weight stable.   Physical Findings:  height is 6\' 2"  (1.88 m) and weight is 175 lb 1.6 oz (79.425 kg). His temperature is 99.1 F (37.3 C). His blood pressure is 136/70 and his pulse is 84.  rash-like appearance to his face and neck with bright erythema and dyness of his neck consistent with Cetuximab.  Thrush on tongue, ulcers over lateral oral tongue, erythematous oral cavity   CBC    Component Value Date/Time   WBC 7.1 06/09/2013 1259   WBC 6.4 04/29/2013 0844   WBC 9.2 03/02/2013 1528   RBC 4.32 06/09/2013 1259   RBC 4.72 04/29/2013 0844   RBC 5.01 03/02/2013 1528   HGB 12.2* 06/09/2013 1259   HGB 13.7 04/29/2013 0844   HGB 14.4 03/02/2013 1528   HCT 37.0* 06/09/2013 1259   HCT 40.7 04/29/2013 0844   HCT 46.8 03/02/2013 1528   PLT 196 06/09/2013 1259   PLT 183 04/29/2013 0844   MCV 85.6 06/09/2013 1259   MCV 86.2 04/29/2013 0844   MCV 93.5 03/02/2013 1528   MCH 28.1 06/09/2013 1259   MCH 29.0 04/29/2013 0844   MCH 28.7 03/02/2013 1528   MCHC 32.8 06/09/2013 1259   MCHC 33.7 04/29/2013 0844   MCHC 30.8* 03/02/2013 1528   RDW 14.4 06/09/2013 1259   RDW 14.2 04/29/2013 0844   LYMPHSABS 0.6* 06/09/2013 1259   LYMPHSABS 1.0 04/29/2013 0844   MONOABS 0.7 06/09/2013 1259   MONOABS 0.6 04/29/2013 0844   EOSABS 0.2 06/09/2013 1259   EOSABS 0.3 04/29/2013 0844   BASOSABS 0.0 06/09/2013 1259   BASOSABS 0.0 04/29/2013 0844     CMP     Component Value Date/Time   NA 136 06/09/2013 1258   NA 143 04/29/2013 0844   K 4.5 06/09/2013 1258   K 4.3 04/29/2013 0844   CL 103  04/29/2013 0844   CO2 27 06/09/2013 1258   CO2 29 04/29/2013 0844   GLUCOSE 300* 06/09/2013 1258   GLUCOSE 106* 04/29/2013 0844   BUN 22.8 06/09/2013 1258   BUN 21 04/29/2013 0844   CREATININE 0.9 06/09/2013 1258   CREATININE 0.98 04/29/2013 0844   CALCIUM 8.7 06/09/2013 1258   CALCIUM 9.4 04/29/2013 0844   PROT 5.9* 06/09/2013 1258   PROT 6.7 04/29/2013 0844   ALBUMIN 2.8* 06/09/2013 1258   ALBUMIN 3.8 04/29/2013 0844   AST 49* 06/09/2013 1258   AST 25 04/29/2013 0844   ALT 85* 06/09/2013 1258   ALT 28 04/29/2013 0844   ALKPHOS 178* 06/09/2013 1258   ALKPHOS 103 04/29/2013 0844   BILITOT 0.49 06/09/2013 1258   BILITOT 0.6 04/29/2013 0844   GFRNONAA 76* 04/29/2013 0844   GFRAA 88* 04/29/2013 0844     Impression:  The patient is tolerating radiotherapy.   Plan:  Continue radiotherapy as planned. Encouragement given. Continue management as above.  -----------------------------------  Eppie Gibson, MD

## 2013-06-09 NOTE — Telephone Encounter (Signed)
gv adn printed appt sched and avs for pt for April adn May  °

## 2013-06-10 ENCOUNTER — Ambulatory Visit
Admission: RE | Admit: 2013-06-10 | Discharge: 2013-06-10 | Disposition: A | Discharge status: Home / Self Care | Payer: Medicare Other | Source: Ambulatory Visit | Attending: Radiation Oncology | Admitting: Radiation Oncology

## 2013-06-10 DIAGNOSIS — B977 Papillomavirus as the cause of diseases classified elsewhere: Secondary | ICD-10-CM | POA: Diagnosis not present

## 2013-06-10 DIAGNOSIS — R599 Enlarged lymph nodes, unspecified: Secondary | ICD-10-CM | POA: Diagnosis not present

## 2013-06-10 DIAGNOSIS — Z510 Encounter for antineoplastic radiation therapy: Secondary | ICD-10-CM | POA: Diagnosis not present

## 2013-06-10 DIAGNOSIS — R21 Rash and other nonspecific skin eruption: Secondary | ICD-10-CM | POA: Diagnosis not present

## 2013-06-10 DIAGNOSIS — C01 Malignant neoplasm of base of tongue: Secondary | ICD-10-CM | POA: Diagnosis not present

## 2013-06-10 DIAGNOSIS — R799 Abnormal finding of blood chemistry, unspecified: Secondary | ICD-10-CM | POA: Diagnosis not present

## 2013-06-11 ENCOUNTER — Ambulatory Visit
Admission: RE | Admit: 2013-06-11 | Discharge: 2013-06-11 | Disposition: A | Discharge status: Home / Self Care | Payer: Medicare Other | Source: Ambulatory Visit | Attending: Radiation Oncology | Admitting: Radiation Oncology

## 2013-06-11 ENCOUNTER — Ambulatory Visit: Payer: Medicare Other | Admitting: Nutrition

## 2013-06-11 ENCOUNTER — Ambulatory Visit (HOSPITAL_BASED_OUTPATIENT_CLINIC_OR_DEPARTMENT_OTHER): Payer: Medicare Other

## 2013-06-11 ENCOUNTER — Other Ambulatory Visit: Payer: Self-pay | Admitting: Hematology and Oncology

## 2013-06-11 VITALS — BP 117/66 | HR 77 | Temp 97.0°F | Resp 18

## 2013-06-11 DIAGNOSIS — C01 Malignant neoplasm of base of tongue: Secondary | ICD-10-CM

## 2013-06-11 DIAGNOSIS — R21 Rash and other nonspecific skin eruption: Secondary | ICD-10-CM

## 2013-06-11 DIAGNOSIS — C770 Secondary and unspecified malignant neoplasm of lymph nodes of head, face and neck: Secondary | ICD-10-CM

## 2013-06-11 DIAGNOSIS — R739 Hyperglycemia, unspecified: Secondary | ICD-10-CM

## 2013-06-11 DIAGNOSIS — Z5112 Encounter for antineoplastic immunotherapy: Secondary | ICD-10-CM

## 2013-06-11 DIAGNOSIS — R799 Abnormal finding of blood chemistry, unspecified: Secondary | ICD-10-CM | POA: Diagnosis not present

## 2013-06-11 DIAGNOSIS — G4700 Insomnia, unspecified: Secondary | ICD-10-CM

## 2013-06-11 DIAGNOSIS — Z510 Encounter for antineoplastic radiation therapy: Secondary | ICD-10-CM | POA: Diagnosis not present

## 2013-06-11 DIAGNOSIS — R070 Pain in throat: Secondary | ICD-10-CM

## 2013-06-11 DIAGNOSIS — B977 Papillomavirus as the cause of diseases classified elsewhere: Secondary | ICD-10-CM | POA: Diagnosis not present

## 2013-06-11 DIAGNOSIS — R599 Enlarged lymph nodes, unspecified: Secondary | ICD-10-CM | POA: Diagnosis not present

## 2013-06-11 MED ORDER — FENTANYL 12 MCG/HR TD PT72: 12.5000 ug | MEDICATED_PATCH | TRANSDERMAL | Status: DC

## 2013-06-11 MED ORDER — ZOLPIDEM TARTRATE 10 MG PO TABS
10.0000 mg | ORAL_TABLET | Freq: Every day | ORAL | Status: DC
Start: 2013-06-11 — End: 2013-09-15

## 2013-06-11 MED ORDER — DIPHENHYDRAMINE HCL 50 MG/ML IJ SOLN
INTRAMUSCULAR | Status: AC
  Filled 2013-06-11: qty 1

## 2013-06-11 MED ORDER — SODIUM CHLORIDE 0.9 % IJ SOLN
10.0000 mL | INTRAMUSCULAR | Status: DC | PRN
  Administered 2013-06-11: 10 mL
  Filled 2013-06-11: qty 10

## 2013-06-11 MED ORDER — HEPARIN SOD (PORK) LOCK FLUSH 100 UNIT/ML IV SOLN
500.0000 [IU] | Freq: Once | INTRAVENOUS | Status: AC | PRN
  Administered 2013-06-11: 500 [IU]
  Filled 2013-06-11: qty 5

## 2013-06-11 MED ORDER — CETUXIMAB CHEMO IV INJECTION 200 MG/100ML
250.0000 mg/m2 | Freq: Once | INTRAVENOUS | Status: AC
  Administered 2013-06-11: 500 mg via INTRAVENOUS
  Filled 2013-06-11: qty 250

## 2013-06-11 MED ORDER — SODIUM CHLORIDE 0.9 % IV SOLN
Freq: Once | INTRAVENOUS | Status: AC
  Administered 2013-06-11: 11:00:00 via INTRAVENOUS

## 2013-06-11 MED ORDER — FENTANYL 25 MCG/HR TD PT72: 25.0000 ug | MEDICATED_PATCH | TRANSDERMAL | Status: DC

## 2013-06-11 MED ORDER — GLUCERNA 1.5 CAL PO LIQD: ORAL | Status: DC

## 2013-06-11 MED ORDER — SODIUM CHLORIDE 0.9 % IV SOLN
2.0000 g | Freq: Once | INTRAVENOUS | Status: AC
  Administered 2013-06-11: 2 g via INTRAVENOUS
  Filled 2013-06-11: qty 4

## 2013-06-11 MED ORDER — DIPHENHYDRAMINE HCL 50 MG/ML IJ SOLN
12.5000 mg | Freq: Once | INTRAMUSCULAR | Status: AC
  Administered 2013-06-11: 12.5 mg via INTRAVENOUS

## 2013-06-11 NOTE — Progress Notes (Signed)
Nutrition followup completed with patient and wife in the chemotherapy room.  Patient is being treated for tongue cancer.  Patient has history of diabetes.  Blood sugars have been elevated with latest glucose of 300.  Albumin is decreased at 2.8, magnesium decreased at 1.2.  Weight is stable at 175.1 pounds on April 27.  Patient has been tolerating Osmolite 1.5.  Nutrition diagnosis: Inadequate oral intake continues.  Intervention: I will discontinue Osmolite 1.5 and change enteral nutrition to Glucerna 1.5, 6 cans daily with 120 ml Free water before and after each bolus feeding, to provide 2136 calories, 117.6 g protein, and 2040 mL free water daily.  Patient and wife educated on tube feeding changes.  Wife verbalized understanding with teach back method used.  Enteral nutrition change will provide additional protein for healing and Carbsteady carbohydrates for improved glycemic control.  Written orders in Epic.  [facility] was notified for tube feeding changes.  Monitoring, evaluation, goals: Patient will tolerate tube feeding changes for improved glycemic control and healing.  Next visit: Wednesday, May 6, during chemotherapy.

## 2013-06-12 ENCOUNTER — Ambulatory Visit
Admission: RE | Admit: 2013-06-12 | Discharge: 2013-06-12 | Disposition: A | Discharge status: Home / Self Care | Payer: Medicare Other | Source: Ambulatory Visit | Attending: Radiation Oncology | Admitting: Radiation Oncology

## 2013-06-12 DIAGNOSIS — Z510 Encounter for antineoplastic radiation therapy: Secondary | ICD-10-CM | POA: Diagnosis not present

## 2013-06-12 DIAGNOSIS — R799 Abnormal finding of blood chemistry, unspecified: Secondary | ICD-10-CM | POA: Diagnosis not present

## 2013-06-12 DIAGNOSIS — C01 Malignant neoplasm of base of tongue: Secondary | ICD-10-CM | POA: Diagnosis not present

## 2013-06-12 DIAGNOSIS — B977 Papillomavirus as the cause of diseases classified elsewhere: Secondary | ICD-10-CM | POA: Diagnosis not present

## 2013-06-12 DIAGNOSIS — R599 Enlarged lymph nodes, unspecified: Secondary | ICD-10-CM | POA: Diagnosis not present

## 2013-06-12 DIAGNOSIS — R21 Rash and other nonspecific skin eruption: Secondary | ICD-10-CM | POA: Diagnosis not present

## 2013-06-13 ENCOUNTER — Ambulatory Visit
Admission: RE | Admit: 2013-06-13 | Discharge: 2013-06-13 | Disposition: A | Discharge status: Home / Self Care | Payer: Medicare Other | Source: Ambulatory Visit | Attending: Radiation Oncology | Admitting: Radiation Oncology

## 2013-06-13 ENCOUNTER — Encounter: Payer: Self-pay | Admitting: *Deleted

## 2013-06-13 ENCOUNTER — Ambulatory Visit: Payer: Medicare Other | Admitting: Radiation Oncology

## 2013-06-13 DIAGNOSIS — C01 Malignant neoplasm of base of tongue: Secondary | ICD-10-CM | POA: Diagnosis not present

## 2013-06-13 DIAGNOSIS — R21 Rash and other nonspecific skin eruption: Secondary | ICD-10-CM | POA: Diagnosis not present

## 2013-06-13 DIAGNOSIS — R799 Abnormal finding of blood chemistry, unspecified: Secondary | ICD-10-CM | POA: Diagnosis not present

## 2013-06-13 DIAGNOSIS — Z510 Encounter for antineoplastic radiation therapy: Secondary | ICD-10-CM | POA: Diagnosis not present

## 2013-06-13 DIAGNOSIS — B977 Papillomavirus as the cause of diseases classified elsewhere: Secondary | ICD-10-CM | POA: Diagnosis not present

## 2013-06-13 DIAGNOSIS — R599 Enlarged lymph nodes, unspecified: Secondary | ICD-10-CM | POA: Diagnosis not present

## 2013-06-13 NOTE — Progress Notes (Signed)
Met with pt during scheduled RT to provide support and encouragement.  Continuing navigation as L2 patient (treatments established).  Gayleen Orem, RN, BSN, [facility] Head & Neck Oncology Navigator [phone]

## 2013-06-16 ENCOUNTER — Other Ambulatory Visit: Payer: Self-pay | Admitting: Hematology and Oncology

## 2013-06-16 ENCOUNTER — Encounter: Payer: Self-pay | Admitting: Radiation Oncology

## 2013-06-16 ENCOUNTER — Ambulatory Visit
Admission: RE | Admit: 2013-06-16 | Discharge: 2013-06-16 | Disposition: A | Discharge status: Home / Self Care | Payer: Medicare Other | Source: Ambulatory Visit | Attending: Radiation Oncology | Admitting: Radiation Oncology

## 2013-06-16 VITALS — BP 124/66 | HR 88 | Temp 97.8°F | Resp 20 | Ht 74.0 in | Wt 174.9 lb

## 2013-06-16 DIAGNOSIS — B977 Papillomavirus as the cause of diseases classified elsewhere: Secondary | ICD-10-CM | POA: Diagnosis not present

## 2013-06-16 DIAGNOSIS — C01 Malignant neoplasm of base of tongue: Secondary | ICD-10-CM

## 2013-06-16 DIAGNOSIS — R21 Rash and other nonspecific skin eruption: Secondary | ICD-10-CM | POA: Diagnosis not present

## 2013-06-16 DIAGNOSIS — R599 Enlarged lymph nodes, unspecified: Secondary | ICD-10-CM | POA: Diagnosis not present

## 2013-06-16 DIAGNOSIS — Z510 Encounter for antineoplastic radiation therapy: Secondary | ICD-10-CM | POA: Diagnosis not present

## 2013-06-16 DIAGNOSIS — R799 Abnormal finding of blood chemistry, unspecified: Secondary | ICD-10-CM | POA: Diagnosis not present

## 2013-06-16 MED ORDER — BIAFINE EX EMUL
CUTANEOUS | Status: DC | PRN
  Administered 2013-06-16: 15:00:00 via TOPICAL

## 2013-06-16 MED ORDER — SILVER SULFADIAZINE 1 % EX CREA
TOPICAL_CREAM | Freq: Two times a day (BID) | CUTANEOUS | Status: DC
  Administered 2013-06-16: 16:00:00 via TOPICAL

## 2013-06-16 NOTE — Addendum Note (Signed)
Encounter addended by: Deirdre Evener, RN on: 06/16/2013  6:44 PM<BR>     Documentation filed: Vitals Section

## 2013-06-16 NOTE — Progress Notes (Addendum)
Weekly rad txs, b/l neck 29/35 completd, raw, erythema dry desquamation, still has ulcersin mouth on left side of tongue, , whispers, has 14mcg toatl duragesic patches right arm, peg tube  belopw siyte is a rash, started minomycin via peg, completed diflucan for thrush, none seen on his tongue, not using carafte,MMW or biotene, is using warm saltwater rinses, , will start glucerna  Cans getting mailed to him, changed from osmolite high blood sugars, , no oral food or fluids, fatigue, pain now 5-6 on 10 scale, also taking morphine solution as well for pain and sleep, is using the durashiled trays 2:56 PM

## 2013-06-16 NOTE — Progress Notes (Signed)
Weekly Management Note:  outpatient Current Dose:  58 Gy  Projected Dose: 70 Gy   Narrative:  The patient presents for routine under treatment assessment.  CBCT/MVCT images/Port film x-rays were reviewed.  The chart was checked. His main complaint today his skin irritation over his neck. It feels raw. Particularly painful when he showers. He is using minocycline ointment. He is keeping his weight relatively stable. He is taking fentanyl and liquid morphine for his pain. He has a new rash over his abdominal wall inferior to his PEG tube but this is not bothersome to him. He denies fevers or chills.  Physical Findings: Weight 174.9 lbs. Temperature 97.8 blood pressure 124/66 pulse 88 respiratory rate 20; 97% on room air  His oropharynx is notable for confluent mucositis and diffuse erythema. No thrush. Neck is notable for early moist desquamation in diffuse erythema. PEG tube ostomy appears relatively unremarkable with no significant discharge ; it has mild erythema. However on the abdominal wall inferior to the the PEG tube site there is a macular papular speckled rash that appears to be irritative dermatitis. He denies latex allergies.    CBC    Component Value Date/Time   WBC 7.1 06/09/2013 1259   WBC 6.4 04/29/2013 0844   WBC 9.2 03/02/2013 1528   RBC 4.32 06/09/2013 1259   RBC 4.72 04/29/2013 0844   RBC 5.01 03/02/2013 1528   HGB 12.2* 06/09/2013 1259   HGB 13.7 04/29/2013 0844   HGB 14.4 03/02/2013 1528   HCT 37.0* 06/09/2013 1259   HCT 40.7 04/29/2013 0844   HCT 46.8 03/02/2013 1528   PLT 196 06/09/2013 1259   PLT 183 04/29/2013 0844   MCV 85.6 06/09/2013 1259   MCV 86.2 04/29/2013 0844   MCV 93.5 03/02/2013 1528   MCH 28.1 06/09/2013 1259   MCH 29.0 04/29/2013 0844   MCH 28.7 03/02/2013 1528   MCHC 32.8 06/09/2013 1259   MCHC 33.7 04/29/2013 0844   MCHC 30.8* 03/02/2013 1528   RDW 14.4 06/09/2013 1259   RDW 14.2 04/29/2013 0844   LYMPHSABS 0.6* 06/09/2013 1259   LYMPHSABS 1.0 04/29/2013 0844   MONOABS 0.7 06/09/2013 1259   MONOABS 0.6 04/29/2013 0844   EOSABS 0.2 06/09/2013 1259   EOSABS 0.3 04/29/2013 0844   BASOSABS 0.0 06/09/2013 1259   BASOSABS 0.0 04/29/2013 0844     CMP     Component Value Date/Time   NA 136 06/09/2013 1258   NA 143 04/29/2013 0844   K 4.5 06/09/2013 1258   K 4.3 04/29/2013 0844   CL 103 04/29/2013 0844   CO2 27 06/09/2013 1258   CO2 29 04/29/2013 0844   GLUCOSE 300* 06/09/2013 1258   GLUCOSE 106* 04/29/2013 0844   BUN 22.8 06/09/2013 1258   BUN 21 04/29/2013 0844   CREATININE 0.9 06/09/2013 1258   CREATININE 0.98 04/29/2013 0844   CALCIUM 8.7 06/09/2013 1258   CALCIUM 9.4 04/29/2013 0844   PROT 5.9* 06/09/2013 1258   PROT 6.7 04/29/2013 0844   ALBUMIN 2.8* 06/09/2013 1258   ALBUMIN 3.8 04/29/2013 0844   AST 49* 06/09/2013 1258   AST 25 04/29/2013 0844   ALT 85* 06/09/2013 1258   ALT 28 04/29/2013 0844   ALKPHOS 178* 06/09/2013 1258   ALKPHOS 103 04/29/2013 0844   BILITOT 0.49 06/09/2013 1258   BILITOT 0.6 04/29/2013 0844   GFRNONAA 76* 04/29/2013 0844   GFRAA 88* 04/29/2013 0844     Impression:  The patient is tolerating radiotherapy.  Plan:  Continue radiotherapy as planned. We will transition from [place] for his skin irritation. Cheryl Cheston instructed him on how to use the Silvadene. He will let us know if the rash on his abdomen worsens or if he develops any fevers or chills. Will apply guaze to protect skin from the peg tube. Does not appear appear to be an infection.  -----------------------------------  Eppie Gibson, MD

## 2013-06-17 ENCOUNTER — Telehealth: Payer: Self-pay | Admitting: *Deleted

## 2013-06-17 ENCOUNTER — Encounter: Payer: Self-pay | Admitting: Hematology and Oncology

## 2013-06-17 ENCOUNTER — Ambulatory Visit
Admission: RE | Admit: 2013-06-17 | Discharge: 2013-06-17 | Disposition: A | Discharge status: Home / Self Care | Payer: Medicare Other | Source: Ambulatory Visit | Attending: Radiation Oncology | Admitting: Radiation Oncology

## 2013-06-17 ENCOUNTER — Other Ambulatory Visit: Payer: Self-pay | Admitting: Hematology and Oncology

## 2013-06-17 ENCOUNTER — Other Ambulatory Visit (HOSPITAL_BASED_OUTPATIENT_CLINIC_OR_DEPARTMENT_OTHER): Payer: Medicare Other

## 2013-06-17 ENCOUNTER — Ambulatory Visit (HOSPITAL_BASED_OUTPATIENT_CLINIC_OR_DEPARTMENT_OTHER): Payer: Medicare Other | Admitting: Hematology and Oncology

## 2013-06-17 VITALS — BP 113/67 | HR 108 | Temp 98.2°F | Resp 18 | Ht 74.0 in | Wt 174.0 lb

## 2013-06-17 DIAGNOSIS — K121 Other forms of stomatitis: Secondary | ICD-10-CM | POA: Diagnosis not present

## 2013-06-17 DIAGNOSIS — E119 Type 2 diabetes mellitus without complications: Secondary | ICD-10-CM

## 2013-06-17 DIAGNOSIS — R799 Abnormal finding of blood chemistry, unspecified: Secondary | ICD-10-CM | POA: Diagnosis not present

## 2013-06-17 DIAGNOSIS — R21 Rash and other nonspecific skin eruption: Secondary | ICD-10-CM | POA: Diagnosis not present

## 2013-06-17 DIAGNOSIS — K1239 Other oral mucositis (ulcerative): Secondary | ICD-10-CM

## 2013-06-17 DIAGNOSIS — R599 Enlarged lymph nodes, unspecified: Secondary | ICD-10-CM | POA: Diagnosis not present

## 2013-06-17 DIAGNOSIS — C01 Malignant neoplasm of base of tongue: Secondary | ICD-10-CM

## 2013-06-17 DIAGNOSIS — D649 Anemia, unspecified: Secondary | ICD-10-CM

## 2013-06-17 DIAGNOSIS — B977 Papillomavirus as the cause of diseases classified elsewhere: Secondary | ICD-10-CM | POA: Diagnosis not present

## 2013-06-17 DIAGNOSIS — R1310 Dysphagia, unspecified: Secondary | ICD-10-CM

## 2013-06-17 DIAGNOSIS — C770 Secondary and unspecified malignant neoplasm of lymph nodes of head, face and neck: Secondary | ICD-10-CM | POA: Diagnosis not present

## 2013-06-17 DIAGNOSIS — R070 Pain in throat: Secondary | ICD-10-CM

## 2013-06-17 DIAGNOSIS — Z510 Encounter for antineoplastic radiation therapy: Secondary | ICD-10-CM | POA: Diagnosis not present

## 2013-06-17 DIAGNOSIS — R739 Hyperglycemia, unspecified: Secondary | ICD-10-CM

## 2013-06-17 LAB — CBC WITH DIFFERENTIAL/PLATELET
BASO%: 0.2 % (ref 0.0–2.0)
BASOS ABS: 0 10*3/uL (ref 0.0–0.1)
EOS ABS: 0.4 10*3/uL (ref 0.0–0.5)
EOS%: 5.4 % (ref 0.0–7.0)
HEMATOCRIT: 39 % (ref 38.4–49.9)
HEMOGLOBIN: 12.6 g/dL — AB (ref 13.0–17.1)
LYMPH%: 12.3 % — AB (ref 14.0–49.0)
MCH: 27.6 pg (ref 27.2–33.4)
MCHC: 32.2 g/dL (ref 32.0–36.0)
MCV: 85.7 fL (ref 79.3–98.0)
MONO#: 0.7 10*3/uL (ref 0.1–0.9)
MONO%: 11.2 % (ref 0.0–14.0)
NEUT%: 70.9 % (ref 39.0–75.0)
NEUTROS ABS: 4.6 10*3/uL (ref 1.5–6.5)
Platelets: 205 10*3/uL (ref 140–400)
RBC: 4.56 10*6/uL (ref 4.20–5.82)
RDW: 14.5 % (ref 11.0–14.6)
WBC: 6.5 10*3/uL (ref 4.0–10.3)
lymph#: 0.8 10*3/uL — ABNORMAL LOW (ref 0.9–3.3)

## 2013-06-17 LAB — COMPREHENSIVE METABOLIC PANEL (CC13)
ALT: 48 U/L (ref 0–55)
AST: 30 U/L (ref 5–34)
Albumin: 2.9 g/dL — ABNORMAL LOW (ref 3.5–5.0)
Alkaline Phosphatase: 135 U/L (ref 40–150)
Anion Gap: 8 mEq/L (ref 3–11)
BUN: 25.5 mg/dL (ref 7.0–26.0)
CO2: 28 mEq/L (ref 22–29)
Calcium: 9.1 mg/dL (ref 8.4–10.4)
Chloride: 105 mEq/L (ref 98–109)
Creatinine: 1 mg/dL (ref 0.7–1.3)
Glucose: 179 mg/dl — ABNORMAL HIGH (ref 70–140)
Potassium: 4.9 mEq/L (ref 3.5–5.1)
Sodium: 141 mEq/L (ref 136–145)
Total Bilirubin: 0.57 mg/dL (ref 0.20–1.20)
Total Protein: 6.2 g/dL — ABNORMAL LOW (ref 6.4–8.3)

## 2013-06-17 LAB — HEMOGLOBIN A1C
HEMOGLOBIN A1C: 8.4 % — AB (ref <5.7)
Mean Plasma Glucose: 194 mg/dL — ABNORMAL HIGH (ref <117)

## 2013-06-17 LAB — MAGNESIUM (CC13): Magnesium: 1.1 mg/dl — CL (ref 1.5–2.5)

## 2013-06-17 NOTE — Telephone Encounter (Signed)
No, since he won't do anything different

## 2013-06-17 NOTE — Telephone Encounter (Signed)
S/w wife as pt unable to speak on phone.  Informed her of Magnesium level low today at 1.1 and asked if pt is taking Magnesium currently.  She asked pt and said he is taking Magnesium twice a day currently.  Informed her dr. Alvy Bimler recommends pt be scheduled to have IV Magnesium infusion here tomorrow or if pt does not want to do that than he needs to increase his Magnesium to three times daily.  She relayed message to pt and said he will increase to three times a day.

## 2013-06-17 NOTE — Progress Notes (Signed)
[place] OFFICE PROGRESS NOTE  Patient Care Team: Tivis Ringer, MD as PCP - General (Internal Medicine) Brooks Sailors, RN as Registered Nurse (Oncology) Heath Lark, MD as Consulting Physician (Hematology and Oncology)  DIAGNOSIS: Base of tongue cancer, ongoing chemotherapy and radiation therapy  SUMMARY OF ONCOLOGIC HISTORY: Oncology History   Base of tongue cancer, HPV positive   Primary site: Lip and Oral Cavity (Bilateral)   Staging method: AJCC 7th Edition   Clinical: Stage IVA (T2, N2c, M0) signed by Heath Lark, MD on 04/18/2013  2:21 PM   Summary: Stage IVA (T2, N2c, M0)       Malignant neoplasm of base of tongue   04/01/2013 Imaging Ct scan of neck showed base of tongue cancer involving vallecula and bilateral LN metastasis   04/15/2013 Surgery Laryngoscopy and biopsy 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   05/05/2013 Procedure The patient had placement of port and feeding tube.   05/07/2013 -  Radiation Therapy The patient received radiation therapy   05/07/2013 - 06/11/2013 Chemotherapy Patient received weekly cetuximab. He received 6 out of planned 7 treatment. The last treatment was not given due to severe oral ulcerations.   05/14/2013 Adverse Reaction Grade 1 to 2 skin rash is noted    INTERVAL HISTORY: Brent Franklin 80 y.o. male returns for further followup. The patient is very miserable from discomfort and pain in his mouth. He refused higher dose of fentanyl patch. He is currently on 37 mcg of fentanyl patch and use morphine as needed for pain. He has significant mucositis, swallowing difficulties as well as presence of productive cough with thick phlegm. He has not lost any weight. Denies any nausea or constipation.  I have  reviewed the past medical history, past surgical history, social history and family history with the patient and they are unchanged from previous note.  ALLERGIES:  has No Known Allergies.  MEDICATIONS:  Current Outpatient Prescriptions  Medication Sig Dispense Refill  . Alum & Mag Hydroxide-Simeth (MAGIC MOUTHWASH W/LIDOCAINE) SOLN 1part nystatin,1part Maaloxplus,1part benadryl,3part 2%viscous lidocaine. Swish/swallow 10 mL up to QID, 49min before meals/bedtime  480 mL  5  . emollient (BIAFINE) cream Apply 1 application topically 2 (two) times daily.      . fentaNYL (DURAGESIC - DOSED MCG/HR) 12 MCG/HR Place 1 patch (12.5 mcg total) onto the skin every 3 (three) days.  5 patch  0  . fentaNYL (DURAGESIC - DOSED MCG/HR) 25 MCG/HR patch Place 1 patch (25 mcg total) onto the skin every 3 (three) days.  5 patch  0  . fluconazole (DIFLUCAN) 100 MG tablet TAKE 1 TABLET BY MOUTH DAILY.  7 tablet  0  . glipiZIDE (GLUCOTROL XL) 5 MG 24 hr tablet Take 5 mg by mouth daily.      Marland Kitchen lidocaine-prilocaine (EMLA) cream Apply 1 application topically as needed.  30 g  0  . metFORMIN (GLUCOPHAGE) 850 MG tablet Take 850 mg by mouth 2 (two) times daily with a meal.      . minocycline (MINOCIN,DYNACIN) 100 MG capsule Place 100 mg into feeding tube 2 (two) times daily.      Marland Kitchen morphine (ROXANOL) 20 MG/ML concentrated solution Take 0.5 mLs (10 mg total) by mouth every 2 (two) hours as needed for severe pain.  120 mL  0  .  Multiple Vitamins-Minerals (PRESERVISION AREDS 2) CAPS Take 1 capsule by mouth daily.       . Nutritional Supplements (GLUCERNA 1.5 CAL) LIQD D/C Osmolite 1.5. Begin Glucerna 1.5 via feeding tube, 1.5 cans QID with 120 ml free water before and after each bolus feeding as tolerated.  1422 mL    . silver sulfADIAZINE (SILVADENE) 1 % cream Apply 1 application topically 2 (two) times daily. Affected areas on neck      . sodium fluoride (FLUORISHIELD) 1.1 % GEL dental gel Place 1 application onto teeth at  bedtime.      . sucralfate (CARAFATE) 1 G tablet Dissolve 1 tablet in 10 mL H20 and swallow 30 min prior to meals and bedtime.      Marland Kitchen zolpidem (AMBIEN) 10 MG tablet Take 1 tablet (10 mg total) by mouth at bedtime.  30 tablet  1  . ondansetron (ZOFRAN) 8 MG tablet Take 1 tablet (8 mg total) by mouth every 8 (eight) hours as needed for nausea.  30 tablet  3  . promethazine (PHENERGAN) 25 MG tablet Take 1 tablet (25 mg total) by mouth every 6 (six) hours as needed for nausea or vomiting.  30 tablet  3   No current facility-administered medications for this visit.   Facility-Administered Medications Ordered in Other Visits  Medication Dose Route Frequency Provider Last Rate Last Dose  . topical emolient (BIAFINE) emulsion   Topical BID Eppie Gibson, MD        REVIEW OF SYSTEMS:   Constitutional: Denies fevers, chills Eyes: Denies blurriness of vision Cardiovascular: Denies palpitation, chest discomfort or lower extremity swelling Gastrointestinal:  Denies nausea, heartburn or change in bowel habits Lymphatics: Denies new lymphadenopathy or easy bruising Neurological:Denies numbness, tingling or new weaknesses Behavioral/Psych: Mood is stable, no new changes  All other systems were reviewed with the patient and are negative.  PHYSICAL EXAMINATION: ECOG PERFORMANCE STATUS: 2 - Symptomatic, <50% confined to bed  Filed Vitals:   06/17/13 1414  BP: 113/67  Pulse: 108  Temp: 98.2 F (36.8 C)  Resp: 18   Filed Weights   06/17/13 1414  Weight: 174 lb (78.926 kg)    GENERAL:alert, in moderate distress and appears uncomfortable SKIN: Significant ulceration around his neck is seen. He also had mild rash around his feeding tube site.  EYES: normal, Conjunctiva are pink and non-injected, sclera clear OROPHARYNX: Significant ulceration in his mouth with no active bleeding. No evidence of thrush  NECK: Difficult to examine due to severe pain. LYMPH:  no palpable lymphadenopathy in the  cervical, axillary or inguinal LUNGS: clear to auscultation and percussion with normal breathing effort HEART: regular rate & rhythm and no murmurs and no lower extremity edema ABDOMEN:abdomen soft, non-tender and normal bowel sounds. Feeding tube site looks okay Musculoskeletal:no cyanosis of digits and no clubbing  NEURO: alert & oriented x 3 with fluent speech, no focal motor/sensory deficits  LABORATORY DATA:  I have reviewed the data as listed    Component Value Date/Time   NA 141 06/17/2013 1405   NA 143 04/29/2013 0844   K 4.9 06/17/2013 1405   K 4.3 04/29/2013 0844   CL 103 04/29/2013 0844   CO2 28 06/17/2013 1405   CO2 29 04/29/2013 0844   GLUCOSE 179* 06/17/2013 1405   GLUCOSE 106* 04/29/2013 0844   BUN 25.5 06/17/2013 1405   BUN 21 04/29/2013 0844   CREATININE 1.0 06/17/2013 1405   CREATININE 0.98 04/29/2013 0844   CALCIUM 9.1 06/17/2013 1405  CALCIUM 9.4 04/29/2013 0844   PROT 6.2* 06/17/2013 1405   PROT 6.7 04/29/2013 0844   ALBUMIN 2.9* 06/17/2013 1405   ALBUMIN 3.8 04/29/2013 0844   AST 30 06/17/2013 1405   AST 25 04/29/2013 0844   ALT 48 06/17/2013 1405   ALT 28 04/29/2013 0844   ALKPHOS 135 06/17/2013 1405   ALKPHOS 103 04/29/2013 0844   BILITOT 0.57 06/17/2013 1405   BILITOT 0.6 04/29/2013 0844   GFRNONAA 76* 04/29/2013 0844   GFRAA 88* 04/29/2013 0844    No results found for this basename: SPEP, UPEP,  kappa and lambda light chains    Lab Results  Component Value Date   WBC 6.5 06/17/2013   NEUTROABS 4.6 06/17/2013   HGB 12.6* 06/17/2013   HCT 39.0 06/17/2013   MCV 85.7 06/17/2013   PLT 205 06/17/2013      Chemistry      Component Value Date/Time   NA 141 06/17/2013 1405   NA 143 04/29/2013 0844   K 4.9 06/17/2013 1405   K 4.3 04/29/2013 0844   CL 103 04/29/2013 0844   CO2 28 06/17/2013 1405   CO2 29 04/29/2013 0844   BUN 25.5 06/17/2013 1405   BUN 21 04/29/2013 0844   CREATININE 1.0 06/17/2013 1405   CREATININE 0.98 04/29/2013 0844      Component Value Date/Time   CALCIUM 9.1 06/17/2013 1405    CALCIUM 9.4 04/29/2013 0844   ALKPHOS 135 06/17/2013 1405   ALKPHOS 103 04/29/2013 0844   AST 30 06/17/2013 1405   AST 25 04/29/2013 0844   ALT 48 06/17/2013 1405   ALT 28 04/29/2013 0844   BILITOT 0.57 06/17/2013 1405   BILITOT 0.6 04/29/2013 0844     ASSESSMENT & PLAN:  #1 Tongue cancer He is experiencing worsening side effects this week compared to last week. He is fairly reluctant to take prescription pain medications. I will continue to see him on a weekly basis. He has made an informed decision today to discontinue further treatment with Erbitux which I felt is appropriate given significant side effects seen. #2 Skin rash This is due to side-effects of Cetuximab. I recommend Benadyl prn for itching and hydrocortisone cream. He has started on minocycline and topical antibiotic cream #3 worsening mucositis with ulceration and severe dysphagia After a prolonged discussion, he wants to continue on current prescription dose of fentanyl patch. I encouraged him to take the liquid morphine as prescribed.  He is reluctant to take liquid morphine.  He will continue followup next week with speech and language therapist. I plan to discontinue chemotherapy as above #4 low magnesium This is expected side effects from cetuximab. I recommended IV infusion but the patient declined. He is instructed to increase magnesium supplement 3 times a day.  #5 hyperglycemia  The patient will continue to take his current medications for diabetes. He requested hemoglobin A1c check and I have ordered a from his labs today. #6 anemia This is likely anemia of chronic disease. The patient denies recent history of bleeding such as epistaxis, hematuria or hematochezia. He is asymptomatic from the anemia. We will observe for now.  He does not require transfusion now.    Orders Placed This Encounter  Procedures  . Hemoglobin A1c    Standing Status: Future     Number of Occurrences: 1     Standing Expiration Date: 06/17/2014    All questions were answered. The patient knows to call the clinic with any problems, questions or concerns. No  barriers to learning was detected.    Heath Lark, MD 06/17/2013 3:22 PM

## 2013-06-18 ENCOUNTER — Telehealth: Payer: Self-pay | Admitting: Hematology and Oncology

## 2013-06-18 ENCOUNTER — Encounter: Payer: Self-pay | Admitting: Nutrition

## 2013-06-18 ENCOUNTER — Encounter: Payer: Self-pay | Admitting: Radiation Oncology

## 2013-06-18 ENCOUNTER — Ambulatory Visit: Payer: Self-pay

## 2013-06-18 ENCOUNTER — Ambulatory Visit
Admission: RE | Admit: 2013-06-18 | Discharge: 2013-06-18 | Disposition: A | Discharge status: Home / Self Care | Payer: Medicare Other | Source: Ambulatory Visit | Attending: Radiation Oncology | Admitting: Radiation Oncology

## 2013-06-18 DIAGNOSIS — B977 Papillomavirus as the cause of diseases classified elsewhere: Secondary | ICD-10-CM | POA: Diagnosis not present

## 2013-06-18 DIAGNOSIS — R1310 Dysphagia, unspecified: Secondary | ICD-10-CM | POA: Diagnosis not present

## 2013-06-18 DIAGNOSIS — E119 Type 2 diabetes mellitus without complications: Secondary | ICD-10-CM | POA: Diagnosis not present

## 2013-06-18 DIAGNOSIS — Z431 Encounter for attention to gastrostomy: Secondary | ICD-10-CM | POA: Diagnosis not present

## 2013-06-18 DIAGNOSIS — R799 Abnormal finding of blood chemistry, unspecified: Secondary | ICD-10-CM | POA: Diagnosis not present

## 2013-06-18 DIAGNOSIS — C01 Malignant neoplasm of base of tongue: Secondary | ICD-10-CM | POA: Diagnosis not present

## 2013-06-18 DIAGNOSIS — Z510 Encounter for antineoplastic radiation therapy: Secondary | ICD-10-CM | POA: Diagnosis not present

## 2013-06-18 DIAGNOSIS — R634 Abnormal weight loss: Secondary | ICD-10-CM | POA: Diagnosis not present

## 2013-06-18 DIAGNOSIS — R21 Rash and other nonspecific skin eruption: Secondary | ICD-10-CM | POA: Diagnosis not present

## 2013-06-18 DIAGNOSIS — R599 Enlarged lymph nodes, unspecified: Secondary | ICD-10-CM | POA: Diagnosis not present

## 2013-06-18 NOTE — Telephone Encounter (Signed)
Talked to pt's wife and she is aware of appt on 5/12, emailed Tiffany to add pt on 5/12 with Dr Alvy Bimler

## 2013-06-18 NOTE — Progress Notes (Signed)
   Weekly Management Note:  outpatient Current Dose:  62 Gy  Projected Dose: 70 Gy   Narrative:  The patient presents for routine under treatment assessment.  CBCT/MVCT images/Port film x-rays were reviewed.  The chart was checked. I saw him briefly at [facility] today. Skin is a bit better with Silvadene. Erbitux was held this week.  Rash on abdomen is similar   Physical Findings:  vitals were not taken for this visit. see above.   CBC    Component Value Date/Time   WBC 6.5 06/17/2013 1405   WBC 6.4 04/29/2013 0844   WBC 9.2 03/02/2013 1528   RBC 4.56 06/17/2013 1405   RBC 4.72 04/29/2013 0844   RBC 5.01 03/02/2013 1528   HGB 12.6* 06/17/2013 1405   HGB 13.7 04/29/2013 0844   HGB 14.4 03/02/2013 1528   HCT 39.0 06/17/2013 1405   HCT 40.7 04/29/2013 0844   HCT 46.8 03/02/2013 1528   PLT 205 06/17/2013 1405   PLT 183 04/29/2013 0844   MCV 85.7 06/17/2013 1405   MCV 86.2 04/29/2013 0844   MCV 93.5 03/02/2013 1528   MCH 27.6 06/17/2013 1405   MCH 29.0 04/29/2013 0844   MCH 28.7 03/02/2013 1528   MCHC 32.2 06/17/2013 1405   MCHC 33.7 04/29/2013 0844   MCHC 30.8* 03/02/2013 1528   RDW 14.5 06/17/2013 1405   RDW 14.2 04/29/2013 0844   LYMPHSABS 0.8* 06/17/2013 1405   LYMPHSABS 1.0 04/29/2013 0844   MONOABS 0.7 06/17/2013 1405   MONOABS 0.6 04/29/2013 0844   EOSABS 0.4 06/17/2013 1405   EOSABS 0.3 04/29/2013 0844   BASOSABS 0.0 06/17/2013 1405   BASOSABS 0.0 04/29/2013 0844     CMP     Component Value Date/Time   NA 141 06/17/2013 1405   NA 143 04/29/2013 0844   K 4.9 06/17/2013 1405   K 4.3 04/29/2013 0844   CL 103 04/29/2013 0844   CO2 28 06/17/2013 1405   CO2 29 04/29/2013 0844   GLUCOSE 179* 06/17/2013 1405   GLUCOSE 106* 04/29/2013 0844   BUN 25.5 06/17/2013 1405   BUN 21 04/29/2013 0844   CREATININE 1.0 06/17/2013 1405   CREATININE 0.98 04/29/2013 0844   CALCIUM 9.1 06/17/2013 1405   CALCIUM 9.4 04/29/2013 0844   PROT 6.2* 06/17/2013 1405   PROT 6.7 04/29/2013 0844   ALBUMIN 2.9* 06/17/2013 1405   ALBUMIN 3.8 04/29/2013  0844   AST 30 06/17/2013 1405   AST 25 04/29/2013 0844   ALT 48 06/17/2013 1405   ALT 28 04/29/2013 0844   ALKPHOS 135 06/17/2013 1405   ALKPHOS 103 04/29/2013 0844   BILITOT 0.57 06/17/2013 1405   BILITOT 0.6 04/29/2013 0844   GFRNONAA 76* 04/29/2013 0844   GFRAA 88* 04/29/2013 0844     Impression:  The patient is tolerating radiotherapy.   Plan:  Continue radiotherapy as planned. Recommended trial of miconazole cream TID on his abdomen, but he wishes to just follow it.  He is willing to continue RT despite feeling the side effects.  He is doing an excellent job with his PEG tube intake.  -----------------------------------  Eppie Gibson, MD

## 2013-06-19 ENCOUNTER — Ambulatory Visit
Admission: RE | Admit: 2013-06-19 | Discharge: 2013-06-19 | Disposition: A | Discharge status: Home / Self Care | Payer: Medicare Other | Source: Ambulatory Visit | Attending: Radiation Oncology | Admitting: Radiation Oncology

## 2013-06-19 DIAGNOSIS — C01 Malignant neoplasm of base of tongue: Secondary | ICD-10-CM | POA: Diagnosis not present

## 2013-06-19 DIAGNOSIS — B977 Papillomavirus as the cause of diseases classified elsewhere: Secondary | ICD-10-CM | POA: Diagnosis not present

## 2013-06-19 DIAGNOSIS — Z510 Encounter for antineoplastic radiation therapy: Secondary | ICD-10-CM | POA: Diagnosis not present

## 2013-06-19 DIAGNOSIS — R799 Abnormal finding of blood chemistry, unspecified: Secondary | ICD-10-CM | POA: Diagnosis not present

## 2013-06-19 DIAGNOSIS — R21 Rash and other nonspecific skin eruption: Secondary | ICD-10-CM | POA: Diagnosis not present

## 2013-06-19 DIAGNOSIS — R599 Enlarged lymph nodes, unspecified: Secondary | ICD-10-CM | POA: Diagnosis not present

## 2013-06-20 ENCOUNTER — Ambulatory Visit
Admission: RE | Admit: 2013-06-20 | Discharge: 2013-06-20 | Disposition: A | Discharge status: Home / Self Care | Payer: Medicare Other | Source: Ambulatory Visit | Attending: Radiation Oncology | Admitting: Radiation Oncology

## 2013-06-20 ENCOUNTER — Encounter: Payer: Self-pay | Admitting: Radiation Oncology

## 2013-06-20 DIAGNOSIS — R799 Abnormal finding of blood chemistry, unspecified: Secondary | ICD-10-CM | POA: Diagnosis not present

## 2013-06-20 DIAGNOSIS — R599 Enlarged lymph nodes, unspecified: Secondary | ICD-10-CM | POA: Diagnosis not present

## 2013-06-20 DIAGNOSIS — C01 Malignant neoplasm of base of tongue: Secondary | ICD-10-CM | POA: Diagnosis not present

## 2013-06-20 DIAGNOSIS — B977 Papillomavirus as the cause of diseases classified elsewhere: Secondary | ICD-10-CM | POA: Diagnosis not present

## 2013-06-20 DIAGNOSIS — Z510 Encounter for antineoplastic radiation therapy: Secondary | ICD-10-CM | POA: Diagnosis not present

## 2013-06-20 DIAGNOSIS — R21 Rash and other nonspecific skin eruption: Secondary | ICD-10-CM | POA: Diagnosis not present

## 2013-06-20 MED ORDER — SILVER SULFADIAZINE 1 % EX CREA
TOPICAL_CREAM | Freq: Two times a day (BID) | CUTANEOUS | Status: DC
  Administered 2013-06-20: 15:00:00 via TOPICAL

## 2013-06-23 ENCOUNTER — Ambulatory Visit: Payer: Self-pay

## 2013-06-23 ENCOUNTER — Other Ambulatory Visit: Payer: Self-pay | Admitting: *Deleted

## 2013-06-23 ENCOUNTER — Ambulatory Visit: Payer: Medicare Other

## 2013-06-23 ENCOUNTER — Ambulatory Visit: Admission: RE | Admit: 2013-06-23 | Payer: Medicare Other | Source: Ambulatory Visit | Admitting: Radiation Oncology

## 2013-06-23 ENCOUNTER — Telehealth: Payer: Self-pay | Admitting: Hematology and Oncology

## 2013-06-23 ENCOUNTER — Other Ambulatory Visit: Payer: Self-pay | Admitting: Hematology and Oncology

## 2013-06-23 ENCOUNTER — Telehealth: Payer: Self-pay | Admitting: *Deleted

## 2013-06-23 NOTE — Telephone Encounter (Signed)
Received VM from patient that he is cancelling today's RT and tomorrow's final treatment.  Returned call and spoke with his wife.  She reported that patient "had a rough day yesterday", several emesis events in the afternoon, very tired, unable to instill nutrition through G-tube.  She reports that he has had success with two feedings today.   I discussed importance of hydration to which she verbalized understanding.  She asked when they might see Dr. Isidore Moos for follow-up since he isn't coming in today.  I shared my knowledge of her schedule, indicated I would contact her for an appt.  Will continue to follow.  Initiating navigation as L2 patient (treatments established) with this encounter.  Gayleen Orem, RN, BSN, [facility] Navigator [phone]

## 2013-06-23 NOTE — Telephone Encounter (Signed)
Sent michelle a staff message to add the pt in for iv fluids daily x 3 over two hrs. Pt's wife is aware of the f/u appt tomorrow at 11:45am

## 2013-06-24 ENCOUNTER — Ambulatory Visit (HOSPITAL_BASED_OUTPATIENT_CLINIC_OR_DEPARTMENT_OTHER): Payer: Medicare Other

## 2013-06-24 ENCOUNTER — Telehealth: Payer: Self-pay | Admitting: Hematology and Oncology

## 2013-06-24 ENCOUNTER — Encounter: Payer: Self-pay | Admitting: Hematology and Oncology

## 2013-06-24 ENCOUNTER — Ambulatory Visit (HOSPITAL_BASED_OUTPATIENT_CLINIC_OR_DEPARTMENT_OTHER): Payer: Medicare Other | Admitting: Hematology and Oncology

## 2013-06-24 ENCOUNTER — Telehealth: Payer: Self-pay | Admitting: *Deleted

## 2013-06-24 ENCOUNTER — Ambulatory Visit: Payer: Medicare Other

## 2013-06-24 VITALS — BP 130/66 | HR 124 | Temp 97.7°F | Resp 19 | Ht 74.0 in | Wt 171.3 lb

## 2013-06-24 DIAGNOSIS — R634 Abnormal weight loss: Secondary | ICD-10-CM

## 2013-06-24 DIAGNOSIS — K1230 Oral mucositis (ulcerative), unspecified: Secondary | ICD-10-CM | POA: Diagnosis not present

## 2013-06-24 DIAGNOSIS — R000 Tachycardia, unspecified: Secondary | ICD-10-CM

## 2013-06-24 DIAGNOSIS — L988 Other specified disorders of the skin and subcutaneous tissue: Secondary | ICD-10-CM

## 2013-06-24 DIAGNOSIS — C770 Secondary and unspecified malignant neoplasm of lymph nodes of head, face and neck: Secondary | ICD-10-CM

## 2013-06-24 DIAGNOSIS — K121 Other forms of stomatitis: Secondary | ICD-10-CM

## 2013-06-24 DIAGNOSIS — C01 Malignant neoplasm of base of tongue: Secondary | ICD-10-CM | POA: Diagnosis not present

## 2013-06-24 DIAGNOSIS — R1310 Dysphagia, unspecified: Secondary | ICD-10-CM

## 2013-06-24 DIAGNOSIS — E860 Dehydration: Secondary | ICD-10-CM | POA: Diagnosis not present

## 2013-06-24 MED ORDER — HEPARIN SOD (PORK) LOCK FLUSH 100 UNIT/ML IV SOLN
500.0000 [IU] | Freq: Once | INTRAVENOUS | Status: AC
  Administered 2013-06-24: 500 [IU] via INTRAVENOUS
  Filled 2013-06-24: qty 5

## 2013-06-24 MED ORDER — SODIUM CHLORIDE 0.9 % IJ SOLN
10.0000 mL | INTRAMUSCULAR | Status: DC | PRN
  Administered 2013-06-24: 10 mL via INTRAVENOUS
  Filled 2013-06-24: qty 10

## 2013-06-24 MED ORDER — SODIUM CHLORIDE 0.45 % IV SOLN
Freq: Once | INTRAVENOUS | Status: AC
  Administered 2013-06-24: 13:00:00 via INTRAVENOUS
  Filled 2013-06-24: qty 1000

## 2013-06-24 NOTE — Progress Notes (Signed)
[place] OFFICE PROGRESS NOTE  Patient Care Team: Tivis Ringer, MD as PCP - General (Internal Medicine) Brooks Sailors, RN as Registered Nurse (Oncology) Heath Lark, MD as Consulting Physician (Hematology and Oncology)  DIAGNOSIS: Locally advanced tongue cancer, for urgent evaluation  SUMMARY OF ONCOLOGIC HISTORY: Oncology History   Base of tongue cancer, HPV positive   Primary site: Lip and Oral Cavity (Bilateral)   Staging method: AJCC 7th Edition   Clinical: Stage IVA (T2, N2c, M0) signed by Heath Lark, MD on 04/18/2013  2:21 PM   Summary: Stage IVA (T2, N2c, M0)       Malignant neoplasm of base of tongue   04/01/2013 Imaging Ct scan of neck showed base of tongue cancer involving vallecula and bilateral LN metastasis   04/15/2013 Surgery Laryngoscopy and biopsy 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   05/05/2013 Procedure The patient had placement of port and feeding tube.   05/07/2013 -  Radiation Therapy The patient received radiation therapy   05/07/2013 - 06/11/2013 Chemotherapy Patient received weekly cetuximab. He received 6 out of planned 7 treatment. The last treatment was not given due to severe oral ulcerations.   05/14/2013 Adverse Reaction Grade 1 to 2 skin rash is noted    INTERVAL HISTORY: Rishard Delange 80 y.o. male returns for further followup. Yesterday, he cancelled his remaining radiation treatments. I received a phone call from his family stating the patient is "miserable". On questioning, the patient is very frustrated that his mild the neck and skin are "completely burned". I asked him several times whether he is in pain. The patient denies pain but "very uncomfortable" with swallowing due to mucositis and "had severe  discomfort" when he tries to move. He does not one his current pain medications adjusted. His able to tolerate between 5-6 cans of nutritional supplement with additional liquids through his feeding tube. He has lost some weight. He complained of abdominal bloating but denies any nausea or vomiting. He denies any dizziness, chest pain or out of breath. He complained of production of mucous in his throat with difficult expectorating the mucous out.  I have reviewed the past medical history, past surgical history, social history and family history with the patient and they are unchanged from previous note.  ALLERGIES:  has No Known Allergies.  MEDICATIONS:  Current Outpatient Prescriptions  Medication Sig Dispense Refill  . emollient (BIAFINE) cream Apply 1 application topically 2 (two) times daily.      . fentaNYL (DURAGESIC - DOSED MCG/HR) 12 MCG/HR Place 1 patch (12.5 mcg total) onto the skin every 3 (three) days.  5 patch  0  . fentaNYL (DURAGESIC - DOSED MCG/HR) 25 MCG/HR patch Place 1 patch (25 mcg total) onto the skin every 3 (three) days.  5 patch  0  . lidocaine-prilocaine (EMLA) cream Apply 1 application topically as needed.  30 g  0  . metFORMIN (GLUCOPHAGE) 850 MG tablet Take 850 mg by mouth 2 (two) times daily with a meal.      . minocycline (MINOCIN,DYNACIN) 100 MG capsule Place 100 mg into feeding tube 2 (two) times daily.      Marland Kitchen morphine (ROXANOL) 20 MG/ML concentrated solution Take 0.5 mLs (10 mg total) by mouth every 2 (two) hours as needed for severe pain.  120 mL  0  . Nutritional Supplements (GLUCERNA 1.5 CAL) LIQD D/C Osmolite 1.5. Begin Glucerna 1.5 via feeding tube, 1.5 cans QID with 120 ml free water before and after each bolus feeding as tolerated.  1422 mL    . ondansetron (ZOFRAN) 8 MG tablet Take 1 tablet (8 mg total) by mouth every 8 (eight) hours as needed for nausea.  30 tablet  3  . promethazine (PHENERGAN) 25 MG tablet Take 1 tablet (25 mg total) by mouth every 6  (six) hours as needed for nausea or vomiting.  30 tablet  3  . silver sulfADIAZINE (SILVADENE) 1 % cream Apply 1 application topically 2 (two) times daily. Affected areas on neck      . sodium fluoride (FLUORISHIELD) 1.1 % GEL dental gel Place 1 application onto teeth at bedtime.      . sucralfate (CARAFATE) 1 G tablet Dissolve 1 tablet in 10 mL H20 and swallow 30 min prior to meals and bedtime.      Marland Kitchen zolpidem (AMBIEN) 10 MG tablet Take 1 tablet (10 mg total) by mouth at bedtime.  30 tablet  1  . Alum & Mag Hydroxide-Simeth (MAGIC MOUTHWASH W/LIDOCAINE) SOLN 1part nystatin,1part Maaloxplus,1part benadryl,3part 2%viscous lidocaine. Swish/swallow 10 mL up to QID, 76min before meals/bedtime  480 mL  5  . fluconazole (DIFLUCAN) 100 MG tablet TAKE 1 TABLET BY MOUTH DAILY.  7 tablet  0  . glipiZIDE (GLUCOTROL XL) 5 MG 24 hr tablet Take 5 mg by mouth daily.      . Multiple Vitamins-Minerals (PRESERVISION AREDS 2) CAPS Take 1 capsule by mouth daily.        No current facility-administered medications for this visit.   Facility-Administered Medications Ordered in Other Visits  Medication Dose Route Frequency Provider Last Rate Last Dose  . topical emolient (BIAFINE) emulsion   Topical BID Eppie Gibson, MD        REVIEW OF SYSTEMS:   Constitutional: Denies fevers, chills  Eyes: Denies blurriness of vision Respiratory: Denies cough, dyspnea or wheezes Cardiovascular: Denies palpitation, chest discomfort or lower extremity swelling Gastrointestinal:  Denies nausea, heartburn or change in bowel habits Lymphatics: Denies new lymphadenopathy  Neurological:Denies numbness, tingling or new weaknesses Behavioral/Psych: Mood is stable, no new changes  All other systems were reviewed with the patient and are negative.  PHYSICAL EXAMINATION: ECOG PERFORMANCE STATUS: 1 - Symptomatic but completely ambulatory  Filed Vitals:   06/24/13 1206  BP: 130/66  Pulse: 124  Temp: 97.7 F (36.5 C)  Resp: 19    Filed Weights   06/24/13 1206  Weight: 171 lb 4.8 oz (77.701 kg)    GENERAL:alert,  in severe distress and appears very uncomfortable.  SKIN:  significant radiation induced skin injury with ulceration. He still had persistent rash from recent cetuximab. EYES: normal, Conjunctiva are pink and non-injected, sclera clear OROPHARYNX: significant ulcerated mucositis. No thrush.  ABDOMEN:abdomen soft,  appears distended and non-tender. Noted rash around the feeding tube Musculoskeletal:no cyanosis of digits and no clubbing  NEURO: alert & oriented x 3 with fluent speech, no focal motor/sensory deficits  LABORATORY DATA:  I have reviewed the data as listed    Component Value Date/Time   NA 141 06/17/2013 1405   NA 143 04/29/2013 0844   K 4.9 06/17/2013 1405   K 4.3 04/29/2013 0844   CL 103 04/29/2013 0844   CO2 28 06/17/2013 1405   CO2 29 04/29/2013 0844   GLUCOSE 179* 06/17/2013 1405   GLUCOSE 106* 04/29/2013 1941  BUN 25.5 06/17/2013 1405   BUN 21 04/29/2013 0844   CREATININE 1.0 06/17/2013 1405   CREATININE 0.98 04/29/2013 0844   CALCIUM 9.1 06/17/2013 1405   CALCIUM 9.4 04/29/2013 0844   PROT 6.2* 06/17/2013 1405   PROT 6.7 04/29/2013 0844   ALBUMIN 2.9* 06/17/2013 1405   ALBUMIN 3.8 04/29/2013 0844   AST 30 06/17/2013 1405   AST 25 04/29/2013 0844   ALT 48 06/17/2013 1405   ALT 28 04/29/2013 0844   ALKPHOS 135 06/17/2013 1405   ALKPHOS 103 04/29/2013 0844   BILITOT 0.57 06/17/2013 1405   BILITOT 0.6 04/29/2013 0844   GFRNONAA 76* 04/29/2013 0844   GFRAA 88* 04/29/2013 0844    No results found for this basename: SPEP,  UPEP,   kappa and lambda light chains    Lab Results  Component Value Date   WBC 6.5 06/17/2013   NEUTROABS 4.6 06/17/2013   HGB 12.6* 06/17/2013   HCT 39.0 06/17/2013   MCV 85.7 06/17/2013   PLT 205 06/17/2013      Chemistry      Component Value Date/Time   NA 141 06/17/2013 1405   NA 143 04/29/2013 0844   K 4.9 06/17/2013 1405   K 4.3 04/29/2013 0844   CL 103 04/29/2013 0844   CO2 28  06/17/2013 1405   CO2 29 04/29/2013 0844   BUN 25.5 06/17/2013 1405   BUN 21 04/29/2013 0844   CREATININE 1.0 06/17/2013 1405   CREATININE 0.98 04/29/2013 0844      Component Value Date/Time   CALCIUM 9.1 06/17/2013 1405   CALCIUM 9.4 04/29/2013 0844   ALKPHOS 135 06/17/2013 1405   ALKPHOS 103 04/29/2013 0844   AST 30 06/17/2013 1405   AST 25 04/29/2013 0844   ALT 48 06/17/2013 1405   ALT 28 04/29/2013 0844   BILITOT 0.57 06/17/2013 1405   BILITOT 0.6 04/29/2013 0844     ASSESSMENT & PLAN:  #1 Tongue cancer He is experiencing worsening side effects this week compared to last week. I will continue to see him on a weekly basis to provide supportive care. #2 Skin rash This is due to side-effects of Cetuximab. I recommend Benadyl prn for itching and hydrocortisone cream.  #3 worsening mucositis with ulceration and severe dysphagia After a prolonged discussion, he wants to continue on current prescription dose of fentanyl patch.  He is reluctant to take liquid morphine.  #4 low magnesium This is expected side effects from cetuximab. I recommended IV infusion but the patient declined. He is instructed tocontinue magnesium supplement 3 times a day.  #5 radiation-induced skin injury around his neck Continue conservative management with topical ointment. #6 clinical dehydration with tachycardia I recommend intravenous fluids. The patient wanted half normal saline instead of recommended normal saline because he felt that I am not replacing his fluids correctly. He wants 1 L of fluids today and 2 L tomorrow. I will try to accommodate his needs. #7 weight loss I recommend he reduces fluid intake through the feeding tube and to continue 6-8 cans of nutritional supplements as tolerated.  All questions were answered. The patient knows to call the clinic with any problems, questions or concerns. No barriers to learning was detected.    Heath Lark, MD 06/24/2013 3:13 PM

## 2013-06-24 NOTE — Telephone Encounter (Signed)
gv and printed appt sched and avs for pt for May °

## 2013-06-24 NOTE — Patient Instructions (Signed)
Dehydration, Adult Dehydration is when you lose more fluids from the body than you take in. Vital organs like the kidneys, brain, and heart cannot function without a proper amount of fluids and salt. Any loss of fluids from the body can cause dehydration.  CAUSES   Vomiting.  Diarrhea.  Excessive sweating.  Excessive urine output.  Fever. SYMPTOMS  Mild dehydration  Thirst.  Dry lips.  Slightly dry mouth. Moderate dehydration  Very dry mouth.  Sunken eyes.  Skin does not bounce back quickly when lightly pinched and released.  Dark urine and decreased urine production.  Decreased tear production.  Headache. Severe dehydration  Very dry mouth.  Extreme thirst.  Rapid, weak pulse (more than 100 beats per minute at rest).  Cold hands and feet.  Not able to sweat in spite of heat and temperature.  Rapid breathing.  Blue lips.  Confusion and lethargy.  Difficulty being awakened.  Minimal urine production.  No tears. DIAGNOSIS  Your caregiver will diagnose dehydration based on your symptoms and your exam. Blood and urine tests will help confirm the diagnosis. The diagnostic evaluation should also identify the cause of dehydration. TREATMENT  Treatment of mild or moderate dehydration can often be done at home by increasing the amount of fluids that you drink. It is best to drink small amounts of fluid more often. Drinking too much at one time can make vomiting worse. Refer to the home care instructions below. Severe dehydration needs to be treated at the hospital where you will probably be given intravenous (IV) fluids that contain water and electrolytes. HOME CARE INSTRUCTIONS   Ask your caregiver about specific rehydration instructions.  Drink enough fluids to keep your urine clear or pale yellow.  Drink small amounts frequently if you have nausea and vomiting.  Eat as you normally do.  Avoid:  Foods or drinks high in sugar.  Carbonated  drinks.  Juice.  Extremely hot or cold fluids.  Drinks with caffeine.  Fatty, greasy foods.  Alcohol.  Tobacco.  Overeating.  Gelatin desserts.  Wash your hands well to avoid spreading bacteria and viruses.  Only take over-the-counter or prescription medicines for pain, discomfort, or fever as directed by your caregiver.  Ask your caregiver if you should continue all prescribed and over-the-counter medicines.  Keep all follow-up appointments with your caregiver. SEEK MEDICAL CARE IF:  You have abdominal pain and it increases or stays in one area (localizes).  You have a rash, stiff neck, or severe headache.  You are irritable, sleepy, or difficult to awaken.  You are weak, dizzy, or extremely thirsty. SEEK IMMEDIATE MEDICAL CARE IF:   You are unable to keep fluids down or you get worse despite treatment.  You have frequent episodes of vomiting or diarrhea.  You have blood or green matter (bile) in your vomit.  You have blood in your stool or your stool looks black and tarry.  You have not urinated in 6 to 8 hours, or you have only urinated a small amount of very dark urine.  You have a fever.  You faint. MAKE SURE YOU:   Understand these instructions.  Will watch your condition.  Will get help right away if you are not doing well or get worse. Document Released: 01/30/2005 Document Revised: 04/24/2011 Document Reviewed: 09/19/2010 Exit[employer] Patient Information 2014 [employer].  

## 2013-06-24 NOTE — Telephone Encounter (Signed)
Per staff message and POF I have scheduled appts.  JMW  

## 2013-06-25 ENCOUNTER — Ambulatory Visit: Payer: Medicare Other

## 2013-06-25 ENCOUNTER — Ambulatory Visit: Admission: RE | Admit: 2013-06-25 | Payer: Medicare Other | Source: Ambulatory Visit

## 2013-06-25 ENCOUNTER — Ambulatory Visit (HOSPITAL_BASED_OUTPATIENT_CLINIC_OR_DEPARTMENT_OTHER): Payer: Medicare Other

## 2013-06-25 ENCOUNTER — Ambulatory Visit
Admission: RE | Admit: 2013-06-25 | Discharge: 2013-06-25 | Disposition: A | Discharge status: Home / Self Care | Payer: Medicare Other | Source: Ambulatory Visit | Attending: Radiation Oncology | Admitting: Radiation Oncology

## 2013-06-25 ENCOUNTER — Other Ambulatory Visit: Payer: Self-pay | Admitting: Hematology and Oncology

## 2013-06-25 ENCOUNTER — Ambulatory Visit: Payer: Self-pay

## 2013-06-25 ENCOUNTER — Ambulatory Visit
Admission: RE | Admit: 2013-06-25 | Discharge: 2013-06-25 | Disposition: A | Discharge status: Home / Self Care | Payer: Medicare Other | Source: Ambulatory Visit

## 2013-06-25 VITALS — BP 136/71 | HR 108 | Temp 98.4°F | Resp 20

## 2013-06-25 DIAGNOSIS — E860 Dehydration: Secondary | ICD-10-CM

## 2013-06-25 DIAGNOSIS — C770 Secondary and unspecified malignant neoplasm of lymph nodes of head, face and neck: Secondary | ICD-10-CM

## 2013-06-25 DIAGNOSIS — R634 Abnormal weight loss: Secondary | ICD-10-CM | POA: Diagnosis not present

## 2013-06-25 DIAGNOSIS — R000 Tachycardia, unspecified: Secondary | ICD-10-CM | POA: Diagnosis not present

## 2013-06-25 DIAGNOSIS — C01 Malignant neoplasm of base of tongue: Secondary | ICD-10-CM

## 2013-06-25 MED ORDER — SODIUM CHLORIDE 0.45 % IV SOLN
INTRAVENOUS | Status: DC
  Administered 2013-06-25: 13:00:00 via INTRAVENOUS
  Filled 2013-06-25: qty 1000

## 2013-06-25 MED ORDER — SODIUM CHLORIDE 0.45 % IV SOLN
Freq: Once | INTRAVENOUS | Status: AC
  Administered 2013-06-25: 14:00:00 via INTRAVENOUS
  Filled 2013-06-25: qty 1000

## 2013-06-25 MED ORDER — SODIUM CHLORIDE 0.9 % IJ SOLN
10.0000 mL | INTRAMUSCULAR | Status: DC | PRN
  Administered 2013-06-25: 10 mL via INTRAVENOUS
  Filled 2013-06-25: qty 10

## 2013-06-25 MED ORDER — HEPARIN SOD (PORK) LOCK FLUSH 100 UNIT/ML IV SOLN
500.0000 [IU] | Freq: Once | INTRAVENOUS | Status: AC
  Administered 2013-06-25: 500 [IU] via INTRAVENOUS
  Filled 2013-06-25: qty 5

## 2013-06-25 NOTE — Progress Notes (Signed)
Patient here pre radiation treatment, neck ara with moist desquamation,bleeding at sites ,weeping also, , and yellow thickened, last time silva dene applied yesterday, didn't take shower today too painful, also mouth painful, othing oral, just shaved aice at times, using peg tube feeding glucerna now, 5-6 cans a day, flushing before and after with free water 3:40 PM

## 2013-06-25 NOTE — Progress Notes (Signed)
Weekly Management Note:  outpatient Current Dose:  66 Gy  Projected Dose: 70 Gy   Narrative:  The patient presents for routine under treatment assessment.  CBCT/MVCT images/Port film x-rays were reviewed.  The chart was checked. His skin is still very painful with diffuse moist desquamation over anterior neck.  Still using silvadene.  He continues to have odynophagia, taking all nutrition by PEG, and received IVF in med/onc today.  Physical Findings: Vitals with Age-Percentiles 06/25/2013 06/25/2013  Length    Systolic 542 706  Diastolic 71 63  Pulse 237 115  Respiration  20  Weight    BMI    VISIT REPORT    diffuse moist desquamation over anterior neck.  Ulcers over oral mucosa bilaterally adjacent to upper molar dental metal.  No thrush.  Erythematous rash still present on abdomen.  Back skin notable for diffuse dry desquamation and erythema.  CBC    Component Value Date/Time   WBC 6.5 06/17/2013 1405   WBC 6.4 04/29/2013 0844   WBC 9.2 03/02/2013 1528   RBC 4.56 06/17/2013 1405   RBC 4.72 04/29/2013 0844   RBC 5.01 03/02/2013 1528   HGB 12.6* 06/17/2013 1405   HGB 13.7 04/29/2013 0844   HGB 14.4 03/02/2013 1528   HCT 39.0 06/17/2013 1405   HCT 40.7 04/29/2013 0844   HCT 46.8 03/02/2013 1528   PLT 205 06/17/2013 1405   PLT 183 04/29/2013 0844   MCV 85.7 06/17/2013 1405   MCV 86.2 04/29/2013 0844   MCV 93.5 03/02/2013 1528   MCH 27.6 06/17/2013 1405   MCH 29.0 04/29/2013 0844   MCH 28.7 03/02/2013 1528   MCHC 32.2 06/17/2013 1405   MCHC 33.7 04/29/2013 0844   MCHC 30.8* 03/02/2013 1528   RDW 14.5 06/17/2013 1405   RDW 14.2 04/29/2013 0844   LYMPHSABS 0.8* 06/17/2013 1405   LYMPHSABS 1.0 04/29/2013 0844   MONOABS 0.7 06/17/2013 1405   MONOABS 0.6 04/29/2013 0844   EOSABS 0.4 06/17/2013 1405   EOSABS 0.3 04/29/2013 0844   BASOSABS 0.0 06/17/2013 1405   BASOSABS 0.0 04/29/2013 0844     CMP     Component Value Date/Time   NA 141 06/17/2013 1405   NA 143 04/29/2013 0844   K 4.9 06/17/2013 1405   K 4.3  04/29/2013 0844   CL 103 04/29/2013 0844   CO2 28 06/17/2013 1405   CO2 29 04/29/2013 0844   GLUCOSE 179* 06/17/2013 1405   GLUCOSE 106* 04/29/2013 0844   BUN 25.5 06/17/2013 1405   BUN 21 04/29/2013 0844   CREATININE 1.0 06/17/2013 1405   CREATININE 0.98 04/29/2013 0844   CALCIUM 9.1 06/17/2013 1405   CALCIUM 9.4 04/29/2013 0844   PROT 6.2* 06/17/2013 1405   PROT 6.7 04/29/2013 0844   ALBUMIN 2.9* 06/17/2013 1405   ALBUMIN 3.8 04/29/2013 0844   AST 30 06/17/2013 1405   AST 25 04/29/2013 0844   ALT 48 06/17/2013 1405   ALT 28 04/29/2013 0844   ALKPHOS 135 06/17/2013 1405   ALKPHOS 103 04/29/2013 0844   BILITOT 0.57 06/17/2013 1405   BILITOT 0.6 04/29/2013 0844   GFRNONAA 76* 04/29/2013 0844   GFRAA 88* 04/29/2013 0844    Impression:  The patient is tolerating radiotherapy with difficulty.   Plan:  Continue break from RT (last fraction was 5 days ago). Dr Valere Dross will reexamine him before RT on 5-18 while I am on vacation.  If he is willing and able to proceed with 34th fraction, he  can do so on that day.  In the meantime, we have given him nonadherent pads to use in addition to his silvadene and he will continue supportive care PRN in med/onc.  -----------------------------------  Eppie Gibson, MD

## 2013-06-25 NOTE — Patient Instructions (Signed)
Dehydration, Adult Dehydration is when you lose more fluids from the body than you take in. Vital organs like the kidneys, brain, and heart cannot function without a proper amount of fluids and salt. Any loss of fluids from the body can cause dehydration.  CAUSES   Vomiting.  Diarrhea.  Excessive sweating.  Excessive urine output.  Fever. SYMPTOMS  Mild dehydration  Thirst.  Dry lips.  Slightly dry mouth. Moderate dehydration  Very dry mouth.  Sunken eyes.  Skin does not bounce back quickly when lightly pinched and released.  Dark urine and decreased urine production.  Decreased tear production.  Headache. Severe dehydration  Very dry mouth.  Extreme thirst.  Rapid, weak pulse (more than 100 beats per minute at rest).  Cold hands and feet.  Not able to sweat in spite of heat and temperature.  Rapid breathing.  Blue lips.  Confusion and lethargy.  Difficulty being awakened.  Minimal urine production.  No tears. DIAGNOSIS  Your caregiver will diagnose dehydration based on your symptoms and your exam. Blood and urine tests will help confirm the diagnosis. The diagnostic evaluation should also identify the cause of dehydration. TREATMENT  Treatment of mild or moderate dehydration can often be done at home by increasing the amount of fluids that you drink. It is best to drink small amounts of fluid more often. Drinking too much at one time can make vomiting worse. Refer to the home care instructions below. Severe dehydration needs to be treated at the hospital where you will probably be given intravenous (IV) fluids that contain water and electrolytes. HOME CARE INSTRUCTIONS   Ask your caregiver about specific rehydration instructions.  Drink enough fluids to keep your urine clear or pale yellow.  Drink small amounts frequently if you have nausea and vomiting.  Eat as you normally do.  Avoid:  Foods or drinks high in sugar.  Carbonated  drinks.  Juice.  Extremely hot or cold fluids.  Drinks with caffeine.  Fatty, greasy foods.  Alcohol.  Tobacco.  Overeating.  Gelatin desserts.  Wash your hands well to avoid spreading bacteria and viruses.  Only take over-the-counter or prescription medicines for pain, discomfort, or fever as directed by your caregiver.  Ask your caregiver if you should continue all prescribed and over-the-counter medicines.  Keep all follow-up appointments with your caregiver. SEEK MEDICAL CARE IF:  You have abdominal pain and it increases or stays in one area (localizes).  You have a rash, stiff neck, or severe headache.  You are irritable, sleepy, or difficult to awaken.  You are weak, dizzy, or extremely thirsty. SEEK IMMEDIATE MEDICAL CARE IF:   You are unable to keep fluids down or you get worse despite treatment.  You have frequent episodes of vomiting or diarrhea.  You have blood or green matter (bile) in your vomit.  You have blood in your stool or your stool looks black and tarry.  You have not urinated in 6 to 8 hours, or you have only urinated a small amount of very dark urine.  You have a fever.  You faint. MAKE SURE YOU:   Understand these instructions.  Will watch your condition.  Will get help right away if you are not doing well or get worse. Document Released: 01/30/2005 Document Revised: 04/24/2011 Document Reviewed: 09/19/2010 Exit[employer] Patient Information 2014 [employer].  

## 2013-06-26 ENCOUNTER — Ambulatory Visit: Payer: Medicare Other

## 2013-06-26 ENCOUNTER — Telehealth: Payer: Self-pay | Admitting: *Deleted

## 2013-06-26 ENCOUNTER — Ambulatory Visit (HOSPITAL_BASED_OUTPATIENT_CLINIC_OR_DEPARTMENT_OTHER): Payer: Medicare Other

## 2013-06-26 VITALS — BP 138/80 | HR 110 | Temp 98.5°F | Resp 18

## 2013-06-26 DIAGNOSIS — E860 Dehydration: Secondary | ICD-10-CM | POA: Diagnosis not present

## 2013-06-26 DIAGNOSIS — C01 Malignant neoplasm of base of tongue: Secondary | ICD-10-CM

## 2013-06-26 MED ORDER — SODIUM CHLORIDE 0.45 % IV SOLN
2000.0000 mL | INTRAVENOUS | Status: AC
  Administered 2013-06-26: 1000 mL via INTRAVENOUS
  Administered 2013-06-26 (×2): 2000 mL via INTRAVENOUS
  Filled 2013-06-26: qty 2000

## 2013-06-26 MED ORDER — SODIUM CHLORIDE 0.45 % IV SOLN: INTRAVENOUS | Status: DC

## 2013-06-26 NOTE — Patient Instructions (Signed)
Dehydration, Adult Dehydration is when you lose more fluids from the body than you take in. Vital organs like the kidneys, brain, and heart cannot function without a proper amount of fluids and salt. Any loss of fluids from the body can cause dehydration.  CAUSES   Vomiting.  Diarrhea.  Excessive sweating.  Excessive urine output.  Fever. SYMPTOMS  Mild dehydration  Thirst.  Dry lips.  Slightly dry mouth. Moderate dehydration  Very dry mouth.  Sunken eyes.  Skin does not bounce back quickly when lightly pinched and released.  Dark urine and decreased urine production.  Decreased tear production.  Headache. Severe dehydration  Very dry mouth.  Extreme thirst.  Rapid, weak pulse (more than 100 beats per minute at rest).  Cold hands and feet.  Not able to sweat in spite of heat and temperature.  Rapid breathing.  Blue lips.  Confusion and lethargy.  Difficulty being awakened.  Minimal urine production.  No tears. DIAGNOSIS  Your caregiver will diagnose dehydration based on your symptoms and your exam. Blood and urine tests will help confirm the diagnosis. The diagnostic evaluation should also identify the cause of dehydration. TREATMENT  Treatment of mild or moderate dehydration can often be done at home by increasing the amount of fluids that you drink. It is best to drink small amounts of fluid more often. Drinking too much at one time can make vomiting worse. Refer to the home care instructions below. Severe dehydration needs to be treated at the hospital where you will probably be given intravenous (IV) fluids that contain water and electrolytes. HOME CARE INSTRUCTIONS   Ask your caregiver about specific rehydration instructions.  Drink enough fluids to keep your urine clear or pale yellow.  Drink small amounts frequently if you have nausea and vomiting.  Eat as you normally do.  Avoid:  Foods or drinks high in sugar.  Carbonated  drinks.  Juice.  Extremely hot or cold fluids.  Drinks with caffeine.  Fatty, greasy foods.  Alcohol.  Tobacco.  Overeating.  Gelatin desserts.  Wash your hands well to avoid spreading bacteria and viruses.  Only take over-the-counter or prescription medicines for pain, discomfort, or fever as directed by your caregiver.  Ask your caregiver if you should continue all prescribed and over-the-counter medicines.  Keep all follow-up appointments with your caregiver. SEEK MEDICAL CARE IF:  You have abdominal pain and it increases or stays in one area (localizes).  You have a rash, stiff neck, or severe headache.  You are irritable, sleepy, or difficult to awaken.  You are weak, dizzy, or extremely thirsty. SEEK IMMEDIATE MEDICAL CARE IF:   You are unable to keep fluids down or you get worse despite treatment.  You have frequent episodes of vomiting or diarrhea.  You have blood or green matter (bile) in your vomit.  You have blood in your stool or your stool looks black and tarry.  You have not urinated in 6 to 8 hours, or you have only urinated a small amount of very dark urine.  You have a fever.  You faint. MAKE SURE YOU:   Understand these instructions.  Will watch your condition.  Will get help right away if you are not doing well or get worse. Document Released: 01/30/2005 Document Revised: 04/24/2011 Document Reviewed: 09/19/2010 Exit[employer] Patient Information 2014 [employer].  

## 2013-06-26 NOTE — Progress Notes (Signed)
1410:  This nurse used [place] syringe brought in by patient to check G-tube.  Pulled back and forward with regular Coke.  No resistance flushing G-Tube.  Patient tried to administer feeding and could not.  This nurse again was able to flush with water with no resistance.  Administered 60 ml of feeding.  Brent Franklin completed feeding and flushed with water.  No back flow except with cough.  Instructed he and wife to try to push back and forth and try coke.  Brent Franklin purchased a 24 oz coke from [place] to take home to be prepared if needed.

## 2013-06-26 NOTE — Progress Notes (Signed)
Discharged at 1525 with spouse.  Ambulatory in no distress.

## 2013-06-26 NOTE — Telephone Encounter (Signed)
PT. IS COMING FOR IV FLUIDS TODAY. INSTRUCTED PT.'S WIFE TO BRING THE SYRINGE SHE USES FOR PT.'S FEEDING. THE NURSE IN THE INFUSION ROOM WILL TRY TO UNPLUG PT. TUBE. PT.'S WIFE VOICES UNDERSTANDING.

## 2013-06-30 ENCOUNTER — Ambulatory Visit: Payer: Medicare Other

## 2013-06-30 ENCOUNTER — Encounter: Payer: Self-pay | Admitting: Hematology and Oncology

## 2013-06-30 ENCOUNTER — Other Ambulatory Visit: Payer: Self-pay | Admitting: Hematology and Oncology

## 2013-06-30 ENCOUNTER — Ambulatory Visit (HOSPITAL_BASED_OUTPATIENT_CLINIC_OR_DEPARTMENT_OTHER): Payer: Medicare Other | Admitting: Hematology and Oncology

## 2013-06-30 ENCOUNTER — Ambulatory Visit: Admission: RE | Admit: 2013-06-30 | Payer: Medicare Other | Source: Ambulatory Visit

## 2013-06-30 ENCOUNTER — Ambulatory Visit
Admission: RE | Admit: 2013-06-30 | Discharge: 2013-06-30 | Disposition: A | Discharge status: Home / Self Care | Payer: Medicare Other | Source: Ambulatory Visit | Attending: Radiation Oncology | Admitting: Radiation Oncology

## 2013-06-30 VITALS — BP 130/53 | HR 112 | Temp 97.7°F | Resp 20 | Ht 74.0 in | Wt 172.0 lb

## 2013-06-30 VITALS — BP 135/71 | HR 97 | Ht 74.0 in | Wt 171.3 lb

## 2013-06-30 DIAGNOSIS — C01 Malignant neoplasm of base of tongue: Secondary | ICD-10-CM

## 2013-06-30 DIAGNOSIS — R634 Abnormal weight loss: Secondary | ICD-10-CM | POA: Diagnosis not present

## 2013-06-30 DIAGNOSIS — K121 Other forms of stomatitis: Secondary | ICD-10-CM | POA: Diagnosis not present

## 2013-06-30 DIAGNOSIS — K1230 Oral mucositis (ulcerative), unspecified: Secondary | ICD-10-CM

## 2013-06-30 DIAGNOSIS — L270 Generalized skin eruption due to drugs and medicaments taken internally: Secondary | ICD-10-CM | POA: Diagnosis not present

## 2013-06-30 DIAGNOSIS — R000 Tachycardia, unspecified: Secondary | ICD-10-CM | POA: Diagnosis not present

## 2013-06-30 DIAGNOSIS — R070 Pain in throat: Secondary | ICD-10-CM

## 2013-06-30 DIAGNOSIS — L589 Radiodermatitis, unspecified: Secondary | ICD-10-CM

## 2013-06-30 DIAGNOSIS — R1310 Dysphagia, unspecified: Secondary | ICD-10-CM | POA: Diagnosis not present

## 2013-06-30 DIAGNOSIS — E860 Dehydration: Secondary | ICD-10-CM

## 2013-06-30 MED ORDER — FENTANYL 12 MCG/HR TD PT72
12.5000 ug | MEDICATED_PATCH | TRANSDERMAL | Status: DC
Start: 2013-06-30 — End: 2013-07-14

## 2013-06-30 MED ORDER — FENTANYL 25 MCG/HR TD PT72
25.0000 ug | MEDICATED_PATCH | TRANSDERMAL | Status: DC
Start: 2013-06-30 — End: 2013-07-14

## 2013-06-30 NOTE — Progress Notes (Signed)
CC: Dr. 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 He is reluctant to have any further radiation therapy. He states that his pain is slightly improved but it is currently 6-7/10.  Physical examination: There is a partial re-epithilialization of his neck bilaterally. Oral cavity and oropharynx are without candidiasis.  Impression: Clinically improved. Again, he is reluctant to continue with daily radiation therapy, and since he is HPV positive he may have already received an adequate dose of radiation therapy.  Plan: He'll see Dr. Isidore Moos for a followup visit next week.

## 2013-06-30 NOTE — Progress Notes (Signed)
To provide support, encouragement and care continuity, met with patient during his appt with Dr. Alvy Bimler.  Pt reiterated that he does not want to continue with final 2 RTs but understands importance of seeing Dr. Valere Dross later today.  He did not express any needs/concerns; I encouraged him to contact me if that changes.  He verbalized understanding.  Continuing to navigate as L3 (treatments completed) patient.  Gayleen Orem, RN, BSN, [facility] Head & Neck Oncology Navigator [phone]

## 2013-06-30 NOTE — Progress Notes (Addendum)
Mr. Brent Franklin is here today to discuss if he will complete his last 2 radiation therapy treatments, but he states he does not want to, but is willing to talk about it.  The skin on his neck shows early signs of reepithelialization and is also moist. Grades pain as a level 6-7 when swallowing or when water hits neck.

## 2013-06-30 NOTE — Progress Notes (Signed)
[name] OFFICE PROGRESS NOTE  Patient Care Team: Tivis Ringer, MD as PCP - General (Internal Medicine) Brooks Sailors, RN as Registered Nurse (Oncology) Heath Lark, MD as Consulting Physician (Hematology and Oncology)  DIAGNOSIS: Base of tongue cancer, for further management  SUMMARY OF ONCOLOGIC HISTORY: Oncology History   Base of tongue cancer, HPV positive   Primary site: Lip and Oral Cavity (Bilateral)   Staging method: AJCC 7th Edition   Clinical: Stage IVA (T2, N2c, M0) signed by Heath Lark, MD on 04/18/2013  2:21 PM   Summary: Stage IVA (T2, N2c, M0)       Malignant neoplasm of base of tongue   04/01/2013 Imaging Ct scan of neck showed base of tongue cancer involving vallecula and bilateral LN metastasis   04/15/2013 Surgery Laryngoscopy and biopsy 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   05/05/2013 Procedure The patient had placement of port and feeding tube.   05/07/2013 - 06/25/2013 Radiation Therapy The patient received radiation therapy   05/07/2013 - 06/11/2013 Chemotherapy Patient received weekly cetuximab. He received 6 out of planned 7 treatment. The last treatment was not given due to severe oral ulcerations.   05/14/2013 Adverse Reaction Grade 1 to 2 skin rash is noted    INTERVAL HISTORY: Brent Franklin 80 y.o. male returns for further followup. Overall, his symptoms has improved with recent IV fluid resuscitation. He has not lost any weight. Is able to tolerate 6-7 cans of nutritional supplements a day. He complained of persistent thickened mucous production in his throat. He denies recent nausea or vomiting. He still has significant skin ulceration from radiation-induced skin injury but it is not worse. He denies any recent  infection.  I have reviewed the past medical history, past surgical history, social history and family history with the patient and they are unchanged from previous note.  ALLERGIES:  has No Known Allergies.  MEDICATIONS:  Current Outpatient Prescriptions  Medication Sig Dispense Refill  . emollient (BIAFINE) cream Apply 1 application topically 2 (two) times daily.      . fentaNYL (DURAGESIC - DOSED MCG/HR) 12 MCG/HR Place 1 patch (12.5 mcg total) onto the skin every 3 (three) days.  5 patch  0  . fentaNYL (DURAGESIC - DOSED MCG/HR) 25 MCG/HR patch Place 1 patch (25 mcg total) onto the skin every 3 (three) days.  5 patch  0  . glipiZIDE (GLUCOTROL XL) 5 MG 24 hr tablet Take 5 mg by mouth daily.      Marland Kitchen lidocaine-prilocaine (EMLA) cream Apply 1 application topically as needed.  30 g  0  . metFORMIN (GLUCOPHAGE) 850 MG tablet Take 850 mg by mouth 2 (two) times daily with a meal.      . minocycline (MINOCIN,DYNACIN) 100 MG capsule Place 100 mg into feeding tube 2 (two) times daily.      Marland Kitchen morphine (ROXANOL) 20 MG/ML concentrated solution Take 0.5 mLs (10 mg total) by mouth every 2 (two) hours as needed for severe pain.  120 mL  0  . Nutritional Supplements (GLUCERNA 1.5 CAL) LIQD D/C Osmolite 1.5. Begin Glucerna 1.5 via feeding tube, 1.5 cans QID with 120 ml free water before and after each bolus feeding as tolerated.  1422 mL    . ondansetron (ZOFRAN) 8 MG tablet Take 1 tablet (8 mg  total) by mouth every 8 (eight) hours as needed for nausea.  30 tablet  3  . promethazine (PHENERGAN) 25 MG tablet Take 1 tablet (25 mg total) by mouth every 6 (six) hours as needed for nausea or vomiting.  30 tablet  3  . silver sulfADIAZINE (SILVADENE) 1 % cream Apply 1 application topically 2 (two) times daily. Affected areas on neck      . sodium fluoride (FLUORISHIELD) 1.1 % GEL dental gel Place 1 application onto teeth at bedtime.      . sucralfate (CARAFATE) 1 G tablet Dissolve 1 tablet in 10 mL H20 and swallow  30 min prior to meals and bedtime.      Marland Kitchen zolpidem (AMBIEN) 10 MG tablet Take 1 tablet (10 mg total) by mouth at bedtime.  30 tablet  1   No current facility-administered medications for this visit.   Facility-Administered Medications Ordered in Other Visits  Medication Dose Route Frequency Provider Last Rate Last Dose  . topical emolient (BIAFINE) emulsion   Topical BID Eppie Gibson, MD        REVIEW OF SYSTEMS:   Constitutional: Denies fevers, chills or abnormal weight loss Eyes: Denies blurriness of vision Respiratory: Denies cough, dyspnea or wheezes Cardiovascular: Denies palpitation, chest discomfort or lower extremity swelling Gastrointestinal:  Denies nausea, heartburn or change in bowel habits Lymphatics: Denies new lymphadenopathy or easy bruising Neurological:Denies numbness, tingling or new weaknesses Behavioral/Psych: Mood is stable, no new changes  All other systems were reviewed with the patient and are negative.  PHYSICAL EXAMINATION: ECOG PERFORMANCE STATUS: 1 - Symptomatic but completely ambulatory  Filed Vitals:   06/30/13 1349  BP: 130/53  Pulse: 112  Temp: 97.7 F (36.5 C)  Resp: 20   Filed Weights   06/30/13 1349  Weight: 172 lb (78.019 kg)    GENERAL:alert, no distress and comfortable SKIN: Significant skin ulceration around his neck, stable but not worse EYES: normal, Conjunctiva are pink and non-injected, sclera clear OROPHARYNX:no exudate, mild persistent mucositis but improved compared to prior visit. No thrush.  NECK: Neck is examine due to severe tenderness.  Musculoskeletal:no cyanosis of digits and no clubbing  NEURO: alert & oriented x 3 with fluent speech, no focal motor/sensory deficits  LABORATORY DATA:  I have reviewed the data as listed    Component Value Date/Time   NA 141 06/17/2013 1405   NA 143 04/29/2013 0844   K 4.9 06/17/2013 1405   K 4.3 04/29/2013 0844   CL 103 04/29/2013 0844   CO2 28 06/17/2013 1405   CO2 29 04/29/2013 0844    GLUCOSE 179* 06/17/2013 1405   GLUCOSE 106* 04/29/2013 0844   BUN 25.5 06/17/2013 1405   BUN 21 04/29/2013 0844   CREATININE 1.0 06/17/2013 1405   CREATININE 0.98 04/29/2013 0844   CALCIUM 9.1 06/17/2013 1405   CALCIUM 9.4 04/29/2013 0844   PROT 6.2* 06/17/2013 1405   PROT 6.7 04/29/2013 0844   ALBUMIN 2.9* 06/17/2013 1405   ALBUMIN 3.8 04/29/2013 0844   AST 30 06/17/2013 1405   AST 25 04/29/2013 0844   ALT 48 06/17/2013 1405   ALT 28 04/29/2013 0844   ALKPHOS 135 06/17/2013 1405   ALKPHOS 103 04/29/2013 0844   BILITOT 0.57 06/17/2013 1405   BILITOT 0.6 04/29/2013 0844   GFRNONAA 76* 04/29/2013 0844   GFRAA 88* 04/29/2013 0844    No results found for this basename: SPEP,  UPEP,   kappa and lambda light chains    Lab Results  Component Value Date   WBC 6.5 06/17/2013   NEUTROABS 4.6 06/17/2013   HGB 12.6* 06/17/2013   HCT 39.0 06/17/2013   MCV 85.7 06/17/2013   PLT 205 06/17/2013      Chemistry      Component Value Date/Time   NA 141 06/17/2013 1405   NA 143 04/29/2013 0844   K 4.9 06/17/2013 1405   K 4.3 04/29/2013 0844   CL 103 04/29/2013 0844   CO2 28 06/17/2013 1405   CO2 29 04/29/2013 0844   BUN 25.5 06/17/2013 1405   BUN 21 04/29/2013 0844   CREATININE 1.0 06/17/2013 1405   CREATININE 0.98 04/29/2013 0844      Component Value Date/Time   CALCIUM 9.1 06/17/2013 1405   CALCIUM 9.4 04/29/2013 0844   ALKPHOS 135 06/17/2013 1405   ALKPHOS 103 04/29/2013 0844   AST 30 06/17/2013 1405   AST 25 04/29/2013 0844   ALT 48 06/17/2013 1405   ALT 28 04/29/2013 0844   BILITOT 0.57 06/17/2013 1405   BILITOT 0.6 04/29/2013 0844     ASSESSMENT & PLAN:  #1 Tongue cancer He would discuss with the radiation oncologist about his future treatment. From the chemotherapy standpoint, he will not receive further treatment. Overall, he is improving. We'll see him back in 2 weeks for further assessment and supportive care. #2 Skin rash This is due to side-effects of Cetuximab. I recommend Benadyl prn for itching and hydrocortisone cream.   #3 mucositis and dysphagia, improved  After a prolonged discussion, he wants to continue on current prescription dose of fentanyl patch. I refilled his prescription today. He is reluctant to take liquid morphine.  #4 low magnesium This is expected side effects from cetuximab. He is instructed tocontinue magnesium supplement 3 times a day.  #5 radiation-induced skin injury around his neck Continue conservative management with topical ointment. #6 clinical dehydration with tachycardia I recommend intravenous fluids. He did not find the IV fluids helpful last week. He wants to continue supplement through his feeding tube which I think is reasonable.  #7 recent weight loss I recommend he reduces fluid intake through the feeding tube and to continue 6-8 cans of nutritional supplements as tolerated.   All questions were answered. The patient knows to call the clinic with any problems, questions or concerns. No barriers to learning was detected. I spent 25 minutes counseling the patient face to face. The total time spent in the appointment was 30 minutes and more than 50% was on counseling and review of test results     Heath Lark, MD 06/30/2013 3:15 PM

## 2013-07-01 ENCOUNTER — Ambulatory Visit: Payer: Medicare Other

## 2013-07-01 ENCOUNTER — Telehealth: Payer: Self-pay | Admitting: *Deleted

## 2013-07-01 NOTE — Telephone Encounter (Signed)
CALLED PATIENT TO INFORM OF FU ON 08-13-13 @ 11:40 AM , SPOKE WITH HIS WIFE AND SHE IS AWARE OF THIS APPT.

## 2013-07-02 ENCOUNTER — Ambulatory Visit: Payer: Medicare Other

## 2013-07-02 ENCOUNTER — Encounter: Payer: Self-pay | Admitting: Radiation Oncology

## 2013-07-05 NOTE — Progress Notes (Signed)
  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  His main issue was painful moist desquamation of the skin over his neck.  He did a remarkable job maintaining his weight.  He received Cetuximab but some of this was held towards the end of his course due to side effects. Last two fractions of RT were also held.  Plan: The patient has completed radiation treatment. The patient will return to radiation oncology clinic for routine followup in about 2 weeks. I advised them to call or return sooner if they have any questions or concerns related to their recovery or treatment.  -----------------------------------  Eppie Gibson, MD

## 2013-07-11 ENCOUNTER — Encounter: Payer: Self-pay | Admitting: Radiation Oncology

## 2013-07-11 ENCOUNTER — Ambulatory Visit
Admission: RE | Admit: 2013-07-11 | Discharge: 2013-07-11 | Disposition: A | Discharge status: Home / Self Care | Payer: Medicare Other | Source: Ambulatory Visit | Attending: Radiation Oncology | Admitting: Radiation Oncology

## 2013-07-11 VITALS — BP 126/65 | HR 94 | Temp 98.1°F | Ht 74.0 in | Wt 165.6 lb

## 2013-07-11 DIAGNOSIS — C01 Malignant neoplasm of base of tongue: Secondary | ICD-10-CM

## 2013-07-11 HISTORY — DX: Personal history of irradiation: Z92.3

## 2013-07-11 NOTE — Addendum Note (Signed)
Encounter addended by: Brooks Sailors, RN on: 07/11/2013  4:52 PM<BR>     Documentation filed: Visit Diagnoses, Notes Section

## 2013-07-11 NOTE — Progress Notes (Signed)
Mr. Iodice here for assessment s/p radiation therapy to his neck for.  Note redness of neck, with small areas of dryness in the bilateraly neck regions. Wearing silk scarf on his neck which he states is very soothing. Mouth is clean and oral mucosa pink and intact.  Continues to have difficulty and pain on swallowing and is exclusively using his PEG tube with instillation of  6 cans of Glucerna daily.  Peg tube site clear.  Has lost ~ 5 lbs since last weight on 06/30/13.

## 2013-07-11 NOTE — Progress Notes (Signed)
To provide support, encouragement and care continuity, met with patient during follow-up appt with Dr. Isidore Moos.  Patient did not express any needs; I encouraged him to call me should that change.  He agreed to do so.  Continuing to navigate as L3 (treatments completed) patient.  Gayleen Orem, RN, BSN, [facility] Head & Neck Oncology Navigator [phone]

## 2013-07-11 NOTE — Progress Notes (Signed)
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  Narrative:  The patient returns today for routine follow-up.  Skin has healed significantly.  Sputum is thick, he is spitting in cup.  Pain is improving but he is swallowing little and using PEG x 6 cans daily.  Feels much better. Doing trismus exercises.                              ALLERGIES:  has No Known Allergies.  Meds: Current Outpatient Prescriptions  Medication Sig Dispense Refill  . emollient (BIAFINE) cream Apply 1 application topically 2 (two) times daily.      . fentaNYL (DURAGESIC - DOSED MCG/HR) 12 MCG/HR Place 1 patch (12.5 mcg total) onto the skin every 3 (three) days.  5 patch  0  . fentaNYL (DURAGESIC - DOSED MCG/HR) 25 MCG/HR patch Place 1 patch (25 mcg total) onto the skin every 3 (three) days.  5 patch  0  . glipiZIDE (GLUCOTROL XL) 5 MG 24 hr tablet Take 5 mg by mouth daily.      Marland Kitchen lidocaine-prilocaine (EMLA) cream Apply 1 application topically as needed.  30 g  0  . metFORMIN (GLUCOPHAGE) 850 MG tablet Take 850 mg by mouth 2 (two) times daily with a meal.      . minocycline (MINOCIN,DYNACIN) 100 MG capsule Place 100 mg into feeding tube 2 (two) times daily.      . Nutritional Supplements (GLUCERNA 1.5 CAL) LIQD D/C Osmolite 1.5. Begin Glucerna 1.5 via feeding tube, 1.5 cans QID with 120 ml free water before and after each bolus feeding as tolerated.  1422 mL    . ondansetron (ZOFRAN) 8 MG tablet  Take 1 tablet (8 mg total) by mouth every 8 (eight) hours as needed for nausea.  30 tablet  3  . promethazine (PHENERGAN) 25 MG tablet Take 1 tablet (25 mg total) by mouth every 6 (six) hours as needed for nausea or vomiting.  30 tablet  3  . silver sulfADIAZINE (SILVADENE) 1 % cream Apply 1 application topically 2 (two) times daily. Affected areas on neck      . sodium fluoride (FLUORISHIELD) 1.1 % GEL dental gel Place 1 application onto teeth at bedtime.      Marland Kitchen zolpidem (AMBIEN) 10 MG tablet Take 1 tablet (10 mg total) by mouth at bedtime.  30 tablet  1  . morphine (ROXANOL) 20 MG/ML concentrated solution Take 0.5 mLs (10 mg total) by mouth every 2 (two) hours as needed for severe pain.  120 mL  0   No current facility-administered medications for this encounter.   Facility-Administered Medications Ordered in Other Encounters  Medication Dose Route Frequency Provider Last Rate Last Dose  . topical emolient (BIAFINE) emulsion   Topical BID Eppie Gibson, MD        Physical Findings: The patient is in no acute distress. Patient is alert and oriented.  height is 6\' 2"  (1.88  m) and weight is 165 lb 9.6 oz (75.116 kg). His temperature is 98.1 F (36.7 C). His blood pressure is 126/65 and his pulse is 94. .  Oral mucosa pink, intact.  Neck - moist desquamation resolved. New Skin is pink, healthy.  Lab Findings: Lab Results  Component Value Date   WBC 6.5 06/17/2013   HGB 12.6* 06/17/2013   HCT 39.0 06/17/2013   MCV 85.7 06/17/2013   PLT 205 06/17/2013    No results found for this basename: TSH    Radiographic Findings: No results found.  Impression/Plan:    1) Head and Neck Cancer Status: healing well from RT  2) Nutritional Status: - weight: goal is to stabilize - PEG tube: intact  3) Risk Factors: The patient has been educated about risk factors including alcohol and tobacco abuse; they understand that avoidance of alcohol and tobacco is important to prevent recurrences as well as  other cancers  4) Swallowing: encouraged to continue exercises.  Patient can improve on this.  Understands risk of dysphagia in long term is higher without swallowing exercises.  5) Dental: Encouraged to continue regular followup with dentistry, and dental hygiene including fluoride rinses.   6) Energy: screen TSH at 6-73mo  7) Social: No active social issues to address at this time  8) Other: skin healing well.  Thick saliva - rinse mouth with water, swallow water PRN.  Time should improve this. Continue pain meds per heme/onc.  9) Follow-up in 4-6wks. The patient was encouraged to call with any issues or questions before then. I will eventually order 4 mo f/u PET. _____________________________________   Eppie Gibson, MD

## 2013-07-14 ENCOUNTER — Telehealth: Payer: Self-pay | Admitting: Hematology and Oncology

## 2013-07-14 ENCOUNTER — Ambulatory Visit: Payer: Medicare Other

## 2013-07-14 ENCOUNTER — Ambulatory Visit: Payer: Medicare Other | Attending: Radiation Oncology

## 2013-07-14 ENCOUNTER — Encounter: Payer: Self-pay | Admitting: Hematology and Oncology

## 2013-07-14 ENCOUNTER — Ambulatory Visit (HOSPITAL_BASED_OUTPATIENT_CLINIC_OR_DEPARTMENT_OTHER): Payer: Medicare Other | Admitting: Hematology and Oncology

## 2013-07-14 VITALS — BP 125/57 | HR 80 | Temp 97.7°F | Resp 18 | Ht 74.0 in | Wt 167.1 lb

## 2013-07-14 DIAGNOSIS — C01 Malignant neoplasm of base of tongue: Secondary | ICD-10-CM

## 2013-07-14 DIAGNOSIS — E119 Type 2 diabetes mellitus without complications: Secondary | ICD-10-CM

## 2013-07-14 DIAGNOSIS — R21 Rash and other nonspecific skin eruption: Secondary | ICD-10-CM | POA: Diagnosis not present

## 2013-07-14 DIAGNOSIS — G4700 Insomnia, unspecified: Secondary | ICD-10-CM

## 2013-07-14 DIAGNOSIS — K1231 Oral mucositis (ulcerative) due to antineoplastic therapy: Secondary | ICD-10-CM | POA: Diagnosis not present

## 2013-07-14 DIAGNOSIS — R293 Abnormal posture: Secondary | ICD-10-CM | POA: Diagnosis not present

## 2013-07-14 DIAGNOSIS — R1310 Dysphagia, unspecified: Secondary | ICD-10-CM | POA: Insufficient documentation

## 2013-07-14 DIAGNOSIS — Z5189 Encounter for other specified aftercare: Secondary | ICD-10-CM | POA: Diagnosis not present

## 2013-07-14 DIAGNOSIS — E46 Unspecified protein-calorie malnutrition: Secondary | ICD-10-CM

## 2013-07-14 DIAGNOSIS — R739 Hyperglycemia, unspecified: Secondary | ICD-10-CM

## 2013-07-14 NOTE — Assessment & Plan Note (Signed)
Currently, he is dependent on nutritional supplements. He is doing well in that regard with no further weight loss.

## 2013-07-14 NOTE — Assessment & Plan Note (Signed)
He'll resume all his oral hypoglycemic agents.

## 2013-07-14 NOTE — Progress Notes (Signed)
[facility] OFFICE PROGRESS NOTE  Patient Care Team: Tivis Ringer, MD as PCP - General (Internal Medicine) Brooks Sailors, RN as Registered Nurse (Oncology) Heath Lark, MD as Consulting Physician (Hematology and Oncology)  SUMMARY OF ONCOLOGIC HISTORY: Oncology History   Base of tongue cancer, HPV positive   Primary site: Lip and Oral Cavity (Bilateral)   Staging method: AJCC 7th Edition   Clinical: Stage IVA (T2, N2c, M0) signed by Heath Lark, MD on 04/18/2013  2:21 PM   Summary: Stage IVA (T2, N2c, M0)       Malignant neoplasm of base of tongue   04/01/2013 Imaging Ct scan of neck showed base of tongue cancer involving vallecula and bilateral LN metastasis   04/15/2013 Surgery Laryngoscopy and biopsy 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   05/05/2013 Procedure The patient had placement of port and feeding tube.   05/07/2013 - 06/25/2013 Radiation Therapy The patient received radiation therapy   05/07/2013 - 06/11/2013 Chemotherapy Patient received weekly cetuximab. He received 6 out of planned 7 treatment. The last treatment was not given due to severe oral ulcerations.   05/14/2013 Adverse Reaction Grade 1 to 2 skin rash is noted    INTERVAL HISTORY: Please see below for problem oriented charting. Overall, all of the symptoms and complications related to his treatment are resolving.  REVIEW OF SYSTEMS:   Constitutional: Denies fevers, chills or abnormal weight loss Eyes: Denies blurriness of vision Respiratory: Denies cough, dyspnea or wheezes Cardiovascular: Denies palpitation, chest discomfort or lower extremity swelling Gastrointestinal:  Denies nausea, heartburn or change in bowel habits Lymphatics: Denies new lymphadenopathy or easy  bruising Neurological:Denies numbness, tingling or new weaknesses Behavioral/Psych: Mood is stable, no new changes  All other systems were reviewed with the patient and are negative.  I have reviewed the past medical history, past surgical history, social history and family history with the patient and they are unchanged from previous note.  ALLERGIES:  has No Known Allergies.  MEDICATIONS:  Current Outpatient Prescriptions  Medication Sig Dispense Refill  . glipiZIDE (GLUCOTROL XL) 5 MG 24 hr tablet Take 5 mg by mouth daily.      . metFORMIN (GLUCOPHAGE) 850 MG tablet Take 850 mg by mouth 2 (two) times daily with a meal.      . morphine (ROXANOL) 20 MG/ML concentrated solution Take 0.5 mLs (10 mg total) by mouth every 2 (two) hours as needed for severe pain.  120 mL  0  . Nutritional Supplements (GLUCERNA 1.5 CAL) LIQD D/C Osmolite 1.5. Begin Glucerna 1.5 via feeding tube, 1.5 cans QID with 120 ml free water before and after each bolus feeding as tolerated.  1422 mL    . promethazine (PHENERGAN) 25 MG tablet Take 1 tablet (25 mg total) by mouth every 6 (six) hours as needed for nausea or vomiting.  30 tablet  3  . silver sulfADIAZINE (SILVADENE) 1 % cream Apply 1 application topically 2 (two) times daily. Affected areas on neck      . sodium fluoride (FLUORISHIELD) 1.1 % GEL dental gel Place 1 application onto teeth at bedtime.      . lidocaine-prilocaine (EMLA) cream Apply 1 application topically as needed.  30 g  0  . zolpidem (AMBIEN) 10 MG tablet Take 1 tablet (10 mg total) by mouth  at bedtime.  30 tablet  1   No current facility-administered medications for this visit.   Facility-Administered Medications Ordered in Other Visits  Medication Dose Route Frequency Provider Last Rate Last Dose  . topical emolient (BIAFINE) emulsion   Topical BID Eppie Gibson, MD        PHYSICAL EXAMINATION: ECOG PERFORMANCE STATUS: 1 - Symptomatic but completely ambulatory  Filed Vitals:   07/14/13  1550  BP: 125/57  Pulse: 80  Temp: 97.7 F (36.5 C)  Resp: 18   Filed Weights   07/14/13 1550  Weight: 167 lb 1.6 oz (75.796 kg)    GENERAL:alert, no distress and comfortable SKIN: skin color, texture, turgor are normal, no rashes or significant lesions. His skin has almost completely healed from recent injury to the skin. EYES: normal, Conjunctiva are pink and non-injected, sclera clear OROPHARYNX:no exudate, no erythema and lips, buccal mucosa, and tongue normal  NECK: supple, thyroid normal size, non-tender, without nodularity LYMPH:  no palpable lymphadenopathy in the cervical, axillary or inguinal LUNGS: clear to auscultation and percussion with normal breathing effort HEART: regular rate & rhythm and no murmurs and no lower extremity edema ABDOMEN:abdomen soft, non-tender and normal bowel sounds. Feeding tube site looks okay Musculoskeletal:no cyanosis of digits and no clubbing  NEURO: alert & oriented x 3 with fluent speech, no focal motor/sensory deficits  LABORATORY DATA:  I have reviewed the data as listed    Component Value Date/Time   NA 141 06/17/2013 1405   NA 143 04/29/2013 0844   K 4.9 06/17/2013 1405   K 4.3 04/29/2013 0844   CL 103 04/29/2013 0844   CO2 28 06/17/2013 1405   CO2 29 04/29/2013 0844   GLUCOSE 179* 06/17/2013 1405   GLUCOSE 106* 04/29/2013 0844   BUN 25.5 06/17/2013 1405   BUN 21 04/29/2013 0844   CREATININE 1.0 06/17/2013 1405   CREATININE 0.98 04/29/2013 0844   CALCIUM 9.1 06/17/2013 1405   CALCIUM 9.4 04/29/2013 0844   PROT 6.2* 06/17/2013 1405   PROT 6.7 04/29/2013 0844   ALBUMIN 2.9* 06/17/2013 1405   ALBUMIN 3.8 04/29/2013 0844   AST 30 06/17/2013 1405   AST 25 04/29/2013 0844   ALT 48 06/17/2013 1405   ALT 28 04/29/2013 0844   ALKPHOS 135 06/17/2013 1405   ALKPHOS 103 04/29/2013 0844   BILITOT 0.57 06/17/2013 1405   BILITOT 0.6 04/29/2013 0844   GFRNONAA 76* 04/29/2013 0844   GFRAA 88* 04/29/2013 0844    No results found for this basename: SPEP, UPEP,  kappa and  lambda light chains    Lab Results  Component Value Date   WBC 6.5 06/17/2013   NEUTROABS 4.6 06/17/2013   HGB 12.6* 06/17/2013   HCT 39.0 06/17/2013   MCV 85.7 06/17/2013   PLT 205 06/17/2013      Chemistry      Component Value Date/Time   NA 141 06/17/2013 1405   NA 143 04/29/2013 0844   K 4.9 06/17/2013 1405   K 4.3 04/29/2013 0844   CL 103 04/29/2013 0844   CO2 28 06/17/2013 1405   CO2 29 04/29/2013 0844   BUN 25.5 06/17/2013 1405   BUN 21 04/29/2013 0844   CREATININE 1.0 06/17/2013 1405   CREATININE 0.98 04/29/2013 0844      Component Value Date/Time   CALCIUM 9.1 06/17/2013 1405   CALCIUM 9.4 04/29/2013 0844   ALKPHOS 135 06/17/2013 1405   ALKPHOS 103 04/29/2013 0844   AST 30 06/17/2013 1405  AST 25 04/29/2013 0844   ALT 48 06/17/2013 1405   ALT 28 04/29/2013 0844   BILITOT 0.57 06/17/2013 1405   BILITOT 0.6 04/29/2013 0844      ASSESSMENT & PLAN:  Malignant neoplasm of base of tongue Overall, he is improving and although toxicities resolving. He is seeing the radiation oncologist next month. I will see him back 2 months from now. Continue supportive care. He is aware that PET CT scan need to be scheduled for months away from last treatment.  Rash Overall, this is healing very well. Continue topical emollient cream  Type II or unspecified type diabetes mellitus without mention of complication, not stated as uncontrolled He'll resume all his oral hypoglycemic agents.  Insomnia This is stable. He will continue taking Ambien as needed.  Mucositis (ulcerative) due to antineoplastic therapy This is resolving. Continue conservative management. The patient is attempting oral intake as tolerated. He has discontinued all pain medications.  Malnutrition Currently, he is dependent on nutritional supplements. He is doing well in that regard with no further weight loss.    All questions were answered. The patient knows to call the clinic with any problems, questions or concerns. No barriers to  learning was detected. I spent 25 minutes counseling the patient face to face. The total time spent in the appointment was 30 minutes and more than 50% was on counseling and review of test results     Heath Lark, MD 07/14/2013 8:21 PM

## 2013-07-14 NOTE — Telephone Encounter (Signed)
gv adn pritned appt scheda dn avs for pt fro [facility] an Aug

## 2013-07-14 NOTE — Assessment & Plan Note (Signed)
This is resolving. Continue conservative management. The patient is attempting oral intake as tolerated. He has discontinued all pain medications.

## 2013-07-14 NOTE — Assessment & Plan Note (Signed)
Overall, he is improving and although toxicities resolving. He is seeing the radiation oncologist next month. I will see him back 2 months from now. Continue supportive care. He is aware that PET CT scan need to be scheduled for months away from last treatment.

## 2013-07-14 NOTE — Assessment & Plan Note (Signed)
This is stable. He will continue taking Ambien as needed.

## 2013-07-14 NOTE — Assessment & Plan Note (Signed)
Overall, this is healing very well. Continue topical emollient cream

## 2013-07-21 ENCOUNTER — Encounter (HOSPITAL_COMMUNITY): Payer: Self-pay | Admitting: Dentistry

## 2013-07-21 ENCOUNTER — Ambulatory Visit (HOSPITAL_COMMUNITY): Payer: Self-pay | Admitting: Dentistry

## 2013-07-21 VITALS — BP 111/66 | HR 77 | Temp 98.0°F | Wt 165.0 lb

## 2013-07-21 DIAGNOSIS — R682 Dry mouth, unspecified: Secondary | ICD-10-CM

## 2013-07-21 DIAGNOSIS — R1310 Dysphagia, unspecified: Secondary | ICD-10-CM

## 2013-07-21 DIAGNOSIS — K089 Disorder of teeth and supporting structures, unspecified: Secondary | ICD-10-CM

## 2013-07-21 DIAGNOSIS — Z9221 Personal history of antineoplastic chemotherapy: Secondary | ICD-10-CM

## 2013-07-21 DIAGNOSIS — K117 Disturbances of salivary secretion: Secondary | ICD-10-CM

## 2013-07-21 DIAGNOSIS — R432 Parageusia: Secondary | ICD-10-CM

## 2013-07-21 DIAGNOSIS — Z0189 Encounter for other specified special examinations: Secondary | ICD-10-CM

## 2013-07-21 DIAGNOSIS — C01 Malignant neoplasm of base of tongue: Secondary | ICD-10-CM

## 2013-07-21 DIAGNOSIS — Z923 Personal history of irradiation: Secondary | ICD-10-CM

## 2013-07-21 NOTE — Progress Notes (Signed)
07/21/2013  Patient:            Brent Franklin Date of Birth:  02-05-1932 MRN:                [id number]  BP 111/66  Pulse 77  Temp(Src) 98 F (36.7 C) (Oral)  Wt 165 lb (74.844 kg)  Hurshel Keys presents for periodic oral examination after radiation therapy. Patient has completed his chemoradiation therapy. Ration therapy was from 05/08/2051 06/20/2013. Patient completed 33/35 prescribed radiation treatments. Patient received 5 cycles of Erbitux chemotherapy.  REVIEW OF CHIEF COMPLAINTS:  DRY MOUTH: Yes HARD TO SWALLOW: Yes  HURT TO SWALLOW: Yes TASTE CHANGES: Patient still has minimal taste. SORES IN MOUTH: Patient denies having sores in his mouth. TRISMUS: Patient denies having trismus symptoms. WEIGHT: Patient weighs 165 pounds and lost approximately 10-15 pounds.  HOME OH REGIMEN:  BRUSHING: Twice a day  FLOSSING: 1 today RINSING: Using salt water and baking soda rinses. FLUORIDE: Using fluoride at bedtime. TRISMUS EXERCISES:  Maximum interincisal opening: 45 mm   DENTAL EXAM:  Oral Hygiene:(PLAQUE): Minimal plaque noted. Oral hygiene instruction provided. Patient was given toothbrush. Patient was given Sensodyne toothpaste samples. LOCATION OF MUCOSITIS: None noted DESCRIPTION OF SALIVA: Decreased in foamy saliva. ANY EXPOSED BONE: None noted OTHER WATCHED AREAS: Teeth in primary field radiation therapy to include tooth numbers 1, 2, 15, 16, 17, 18, 31, and 32. Multiple abfraction/flexure lesions  DX: Xerostomia, Dysgeusia, Dysphagia, Odynophagia, Weight Loss and Multiple flexure lesions  RECOMMENDATIONS: 1. Brush after meals and at bedtime.  Use fluoride at bedtime. 2. Use trismus exercises as directed. 3. Use Biotene Rinse or salt water/baking soda rinses. 4. Multiple sips of water as needed. 5. Consider restoration of the abfraction lesions. 5. Return to his primary dentist, Dr. Jerold Coombe, for exam and cleaning in approximately 2-3 months.   Lenn Cal,  DDS

## 2013-07-21 NOTE — Patient Instructions (Signed)

## 2013-07-24 ENCOUNTER — Encounter (HOSPITAL_COMMUNITY): Payer: Self-pay | Admitting: Dentistry

## 2013-07-28 ENCOUNTER — Ambulatory Visit: Payer: Medicare Other | Admitting: Nutrition

## 2013-07-28 NOTE — Progress Notes (Signed)
Patient had multiple questions regarding tube feeding.  He reports he is one month out from treatment and has been on Glucerna 1.5 - 6 cans daily.  Patient is questioning if protein content is appropriate.  He also has questions regarding total free water required, if fat content is appropriate and if he should be on a different tube feeding.  Reviewed patient's chart and recommended patient continue on Glucerna 1.5 - 6 cans daily, to provide adequate calories and protein for healing.  Reviewed strategies for increasing oral intake as patient heals.  Patient appreciative of nutrition information and reports she will continue to contact me as needed.

## 2013-08-08 ENCOUNTER — Other Ambulatory Visit: Payer: Self-pay | Admitting: Hematology and Oncology

## 2013-08-08 ENCOUNTER — Telehealth: Payer: Self-pay | Admitting: *Deleted

## 2013-08-08 NOTE — Telephone Encounter (Signed)
Called patient to check on well being.  In response to inquiry, patient stated:  - Pain:  Still experiencing throat pain with swallowing, not taking any medication. - Nutrition/Hydration:  Able to swallow a couple of tbsp of soft food at a sitting, sips lots of water.  Using PEG for nutritional supplement per nutritionist's guidance.  - BMs: Regular but not firm d/t liquid diet. - Skin: No issues.  I encouraged him to protect head/neck from direct sunlight, he verbalized understanding.  Not using Biafine, applying an OTC ointment. - Energy Level: moderate -  Mouth:  Saliva is foamy but not necessarily thick. - Swallowing Exercises:  Somewhat compliant. - Needs: Denied. I encouraged him to call me in future if needs arise; he agreed to do so; expressed appreciation for my call.  Continuing to navigate as L3 (treatments completed) patient.  Gayleen Orem, RN, BSN, [facility] Head & Neck Oncology Navigator [phone]

## 2013-08-13 ENCOUNTER — Ambulatory Visit: Payer: Medicare Other | Admitting: Radiation Oncology

## 2013-08-22 ENCOUNTER — Encounter: Payer: Self-pay | Admitting: Radiation Oncology

## 2013-08-22 ENCOUNTER — Ambulatory Visit (HOSPITAL_BASED_OUTPATIENT_CLINIC_OR_DEPARTMENT_OTHER): Payer: Medicare Other

## 2013-08-22 ENCOUNTER — Ambulatory Visit
Admission: RE | Admit: 2013-08-22 | Discharge: 2013-08-22 | Disposition: A | Discharge status: Home / Self Care | Payer: Medicare Other | Source: Ambulatory Visit | Attending: Radiation Oncology | Admitting: Radiation Oncology

## 2013-08-22 VITALS — BP 120/59 | HR 82 | Temp 97.6°F | Resp 16 | Wt 165.8 lb

## 2013-08-22 VITALS — BP 125/62 | HR 78

## 2013-08-22 DIAGNOSIS — Z452 Encounter for adjustment and management of vascular access device: Secondary | ICD-10-CM | POA: Diagnosis not present

## 2013-08-22 DIAGNOSIS — Z95828 Presence of other vascular implants and grafts: Secondary | ICD-10-CM

## 2013-08-22 DIAGNOSIS — C01 Malignant neoplasm of base of tongue: Secondary | ICD-10-CM

## 2013-08-22 DIAGNOSIS — R5383 Other fatigue: Secondary | ICD-10-CM

## 2013-08-22 DIAGNOSIS — R5381 Other malaise: Secondary | ICD-10-CM

## 2013-08-22 MED ORDER — SODIUM CHLORIDE 0.9 % IJ SOLN
10.0000 mL | INTRAMUSCULAR | Status: DC | PRN
  Administered 2013-08-22: 10 mL via INTRAVENOUS
  Filled 2013-08-22: qty 10

## 2013-08-22 MED ORDER — HEPARIN SOD (PORK) LOCK FLUSH 100 UNIT/ML IV SOLN
500.0000 [IU] | Freq: Once | INTRAVENOUS | Status: AC
  Administered 2013-08-22: 500 [IU] via INTRAVENOUS
  Filled 2013-08-22: qty 5

## 2013-08-22 NOTE — Progress Notes (Signed)
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  Narrative:  The patient returns today for routine follow-up.  Reports dry mouth continues. Reports it takes great effort to eat and drink. Weight and vitals stable. Reports he plans to schedule a dental cleaning for later this month. Reports he has not had a TSH check but, denies fatigue. Patient reports that he is participating regularly in exercise classes. Reports he continues to instill six cans per day of Glucerna. Continues to spit thick sputum in a cup he carries but, reports the amount has lessened.                            ALLERGIES:  has No Known Allergies.  Meds: Current Outpatient Prescriptions  Medication Sig Dispense Refill  . glipiZIDE (GLUCOTROL XL) 5 MG 24 hr tablet Take 5 mg by mouth daily.      . metFORMIN (GLUCOPHAGE) 850 MG tablet Take 850 mg by mouth 2 (two) times daily with a meal.      . Nutritional Supplements (GLUCERNA 1.5 CAL) LIQD D/C Osmolite 1.5. Begin Glucerna 1.5 via feeding tube, 1.5 cans QID with 120 ml free water before and after each bolus feeding as tolerated.  1422 mL    . sodium fluoride (FLUORISHIELD) 1.1 % GEL dental gel Place 1 application onto teeth at bedtime.      . lidocaine-prilocaine (EMLA) cream Apply 1 application topically as needed.  30 g  0  . promethazine (PHENERGAN) 25 MG tablet Take 1 tablet (25 mg total) by mouth every 6 (six) hours as needed for nausea or  vomiting.  30 tablet  3  . zolpidem (AMBIEN) 10 MG tablet Take 1 tablet (10 mg total) by mouth at bedtime.  30 tablet  1   No current facility-administered medications for this encounter.   Facility-Administered Medications Ordered in Other Encounters  Medication Dose Route Frequency Provider Last Rate Last Dose  . topical emolient (BIAFINE) emulsion   Topical BID Eppie Gibson, MD        Physical Findings: The patient is in no acute distress. Patient is alert and oriented.  weight is 165 lb 12.8 oz (75.206 kg). His oral temperature is 97.6 F (36.4 C). His blood pressure is 120/59 and his pulse is 82. His respiration is 16 and oxygen saturation is 100%. . Oropharyngeal mucosa is intact with no thrush or lesions. No palpable cervical or supraclavicular lymphadenopathy. Skin intact and smooth over neck.  PEG tube site without infection, no bleeding. Rash on abdomen attributed to tape allergy per patient   Lab Findings: Lab Results  Component Value Date   WBC 6.5 06/17/2013   HGB 12.6* 06/17/2013   HCT 39.0 06/17/2013   MCV 85.7 06/17/2013   PLT 205 06/17/2013    No results found for this basename: TSH    Radiographic Findings: No results found.  Impression/Plan:    1) Head and Neck Cancer Status: healing  from RT  2) Nutritional Status: - weight:stable - PEG tube:still using for all of nutrition  3) Risk Factors: The patient has been educated about risk factors including alcohol and tobacco abuse; they understand that avoidance of alcohol and tobacco is important to prevent recurrences as well as other cancers  4) Swallowing:can only swallow 6 TBSP daily; sees Glendell Docker on 7-13 for SLP  5) Dental: Encouraged to continue regular followup with dentistry, and dental hygiene including fluoride rinses.   6) Energy: Tired, but healing from RT. Check TSH at next f/u.  7) Social: No active social issues to address at this time  8) Other: pain free. Removed narcotics from med list  9)  Follow-up in 2 months with TSH, PET. The patient was encouraged to call with any issues or questions before then.   _____________________________________   Eppie Gibson, MD

## 2013-08-22 NOTE — Progress Notes (Signed)
Reports nocturia x 4. Reports dry mouth continues. Reports it takes great effort to eat and drink. Weight and vitals stable. Reports he plans to schedule a dental cleaning for later this month. Reports he has not had a TSH check but, denies fatigue. Patient reports that he is participating regularly in exercise classes. Reports he continues to instill six cans per day of Glucerna. Continues to spit thick sputum in a cup he carries but, reports the amount has lessened.

## 2013-08-25 ENCOUNTER — Encounter: Payer: Self-pay | Admitting: Radiation Oncology

## 2013-08-25 ENCOUNTER — Ambulatory Visit: Payer: Medicare Other | Attending: Radiation Oncology

## 2013-08-25 ENCOUNTER — Ambulatory Visit: Payer: Medicare Other

## 2013-08-25 DIAGNOSIS — Z5189 Encounter for other specified aftercare: Secondary | ICD-10-CM | POA: Diagnosis not present

## 2013-08-25 DIAGNOSIS — R293 Abnormal posture: Secondary | ICD-10-CM | POA: Insufficient documentation

## 2013-08-25 DIAGNOSIS — C01 Malignant neoplasm of base of tongue: Secondary | ICD-10-CM | POA: Diagnosis not present

## 2013-08-25 DIAGNOSIS — R1310 Dysphagia, unspecified: Secondary | ICD-10-CM | POA: Insufficient documentation

## 2013-08-26 ENCOUNTER — Telehealth: Payer: Self-pay | Admitting: *Deleted

## 2013-08-26 NOTE — Telephone Encounter (Signed)
Called patient to inform of lab, test and fu, spoke with patient and he is aware of these appts. 

## 2013-09-15 ENCOUNTER — Telehealth: Payer: Self-pay | Admitting: Hematology and Oncology

## 2013-09-15 ENCOUNTER — Encounter: Payer: Self-pay | Admitting: Hematology and Oncology

## 2013-09-15 ENCOUNTER — Ambulatory Visit (HOSPITAL_BASED_OUTPATIENT_CLINIC_OR_DEPARTMENT_OTHER): Payer: Medicare Other | Admitting: Hematology and Oncology

## 2013-09-15 VITALS — BP 130/70 | HR 77 | Temp 97.8°F | Resp 18 | Ht 74.0 in | Wt 164.8 lb

## 2013-09-15 DIAGNOSIS — E46 Unspecified protein-calorie malnutrition: Secondary | ICD-10-CM

## 2013-09-15 DIAGNOSIS — R1310 Dysphagia, unspecified: Secondary | ICD-10-CM | POA: Diagnosis not present

## 2013-09-15 DIAGNOSIS — C01 Malignant neoplasm of base of tongue: Secondary | ICD-10-CM

## 2013-09-15 DIAGNOSIS — B977 Papillomavirus as the cause of diseases classified elsewhere: Secondary | ICD-10-CM | POA: Diagnosis not present

## 2013-09-15 DIAGNOSIS — E119 Type 2 diabetes mellitus without complications: Secondary | ICD-10-CM

## 2013-09-15 NOTE — Progress Notes (Signed)
[place] OFFICE PROGRESS NOTE  Patient Care Team: Tivis Ringer, MD as PCP - General (Internal Medicine) Brooks Sailors, RN as Registered Nurse (Oncology) Heath Lark, MD as Consulting Physician (Hematology and Oncology)  SUMMARY OF ONCOLOGIC HISTORY: Oncology History   Base of tongue cancer, HPV positive   Primary site: Lip and Oral Cavity (Bilateral)   Staging method: AJCC 7th Edition   Clinical: Stage IVA (T2, N2c, M0) signed by Heath Lark, MD on 04/18/2013  2:21 PM   Summary: Stage IVA (T2, N2c, M0)       Malignant neoplasm of base of tongue   04/01/2013 Imaging Ct scan of neck showed base of tongue cancer involving vallecula and bilateral LN metastasis   04/15/2013 Surgery Laryngoscopy and biopsy 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   05/05/2013 Procedure The patient had placement of port and feeding tube.   05/07/2013 - 06/25/2013 Radiation Therapy The patient received radiation therapy   05/07/2013 - 06/11/2013 Chemotherapy Patient received weekly cetuximab. He received 6 out of planned 7 treatment. The last treatment was not given due to severe oral ulcerations.   05/14/2013 Adverse Reaction Grade 1 to 2 skin rash is noted    INTERVAL HISTORY: Please see below for problem oriented charting. He returns today to followup on his history of tongue cancer. He is experimenting with soft diet and have difficulties with swallowing. He denies recent choking sensation. His skin rash has resolved. He has lost 5 pounds of weight since I saw him.  REVIEW OF SYSTEMS:   Constitutional: Denies fevers, chills  Eyes: Denies blurriness of vision Ears, nose, mouth, throat, and face: Denies mucositis or sore throat Respiratory: Denies cough, dyspnea or  wheezes Cardiovascular: Denies palpitation, chest discomfort or lower extremity swelling Gastrointestinal:  Denies nausea, heartburn or change in bowel habits Skin: Denies abnormal skin rashes Lymphatics: Denies new lymphadenopathy or easy bruising Neurological:Denies numbness, tingling or new weaknesses Behavioral/Psych: Mood is stable, no new changes  All other systems were reviewed with the patient and are negative.  I have reviewed the past medical history, past surgical history, social history and family history with the patient and they are unchanged from previous note.  ALLERGIES:  has No Known Allergies.  MEDICATIONS:  Current Outpatient Prescriptions  Medication Sig Dispense Refill  . aspirin 81 MG tablet Take 81 mg by mouth daily.      Marland Kitchen atorvastatin (LIPITOR) 20 MG tablet Take 20 mg by mouth daily.      Marland Kitchen glipiZIDE (GLUCOTROL XL) 5 MG 24 hr tablet Take 5 mg by mouth daily.      . metFORMIN (GLUCOPHAGE) 850 MG tablet Take 850 mg by mouth 2 (two) times daily with a meal.      . Nutritional Supplements (GLUCERNA 1.5 CAL) LIQD D/C Osmolite 1.5. Begin Glucerna 1.5 via feeding tube, 1.5 cans QID with 120 ml free water before and after each bolus feeding as tolerated.  1422 mL    . sodium fluoride (FLUORISHIELD) 1.1 % GEL dental gel Place 1 application onto teeth at bedtime.       No current facility-administered medications for this visit.   Facility-Administered Medications Ordered in Other Visits  Medication Dose Route Frequency Provider Last Rate Last Dose  . topical emolient (BIAFINE) emulsion   Topical BID Eppie Gibson,  MD        PHYSICAL EXAMINATION: ECOG PERFORMANCE STATUS: 0 - Asymptomatic  Filed Vitals:   09/15/13 1501  BP: 130/70  Pulse: 77  Temp: 97.8 F (36.6 C)  Resp: 18   Filed Weights   09/15/13 1501  Weight: 164 lb 12.8 oz (74.753 kg)    GENERAL:alert, no distress and comfortable SKIN: skin color, texture, turgor are normal, no rashes or significant  lesions. Prior radiation-induced skin injury has resolved. EYES: normal, Conjunctiva are pink and non-injected, sclera clear OROPHARYNX:no exudate, no erythema and lips, buccal mucosa, and tongue normal  NECK: supple, thyroid normal size, non-tender, without nodularity LYMPH:  no palpable lymphadenopathy in the cervical, axillary or inguinal LUNGS: clear to auscultation and percussion with normal breathing effort HEART: regular rate & rhythm and no murmurs and no lower extremity edema ABDOMEN:abdomen soft, non-tender and normal bowel sounds. Feeding tube site looks okay Musculoskeletal:no cyanosis of digits and no clubbing  NEURO: alert & oriented x 3 with fluent speech, no focal motor/sensory deficits  LABORATORY DATA:  I have reviewed the data as listed    Component Value Date/Time   NA 141 06/17/2013 1405   NA 143 04/29/2013 0844   K 4.9 06/17/2013 1405   K 4.3 04/29/2013 0844   CL 103 04/29/2013 0844   CO2 28 06/17/2013 1405   CO2 29 04/29/2013 0844   GLUCOSE 179* 06/17/2013 1405   GLUCOSE 106* 04/29/2013 0844   BUN 25.5 06/17/2013 1405   BUN 21 04/29/2013 0844   CREATININE 1.0 06/17/2013 1405   CREATININE 0.98 04/29/2013 0844   CALCIUM 9.1 06/17/2013 1405   CALCIUM 9.4 04/29/2013 0844   PROT 6.2* 06/17/2013 1405   PROT 6.7 04/29/2013 0844   ALBUMIN 2.9* 06/17/2013 1405   ALBUMIN 3.8 04/29/2013 0844   AST 30 06/17/2013 1405   AST 25 04/29/2013 0844   ALT 48 06/17/2013 1405   ALT 28 04/29/2013 0844   ALKPHOS 135 06/17/2013 1405   ALKPHOS 103 04/29/2013 0844   BILITOT 0.57 06/17/2013 1405   BILITOT 0.6 04/29/2013 0844   GFRNONAA 76* 04/29/2013 0844   GFRAA 88* 04/29/2013 0844    No results found for this basename: SPEP, UPEP,  kappa and lambda light chains    Lab Results  Component Value Date   WBC 6.5 06/17/2013   NEUTROABS 4.6 06/17/2013   HGB 12.6* 06/17/2013   HCT 39.0 06/17/2013   MCV 85.7 06/17/2013   PLT 205 06/17/2013      Chemistry      Component Value Date/Time   NA 141 06/17/2013 1405   NA 143  04/29/2013 0844   K 4.9 06/17/2013 1405   K 4.3 04/29/2013 0844   CL 103 04/29/2013 0844   CO2 28 06/17/2013 1405   CO2 29 04/29/2013 0844   BUN 25.5 06/17/2013 1405   BUN 21 04/29/2013 0844   CREATININE 1.0 06/17/2013 1405   CREATININE 0.98 04/29/2013 0844      Component Value Date/Time   CALCIUM 9.1 06/17/2013 1405   CALCIUM 9.4 04/29/2013 0844   ALKPHOS 135 06/17/2013 1405   ALKPHOS 103 04/29/2013 0844   AST 30 06/17/2013 1405   AST 25 04/29/2013 0844   ALT 48 06/17/2013 1405   ALT 28 04/29/2013 0844   BILITOT 0.57 06/17/2013 1405   BILITOT 0.6 04/29/2013 0844     ASSESSMENT & PLAN:  Malignant neoplasm of base of tongue Overall, he is improving and toxicities are resolving. He is seeing the radiation  oncologist next month. I will see him back 3 months from now. Continue supportive care. His PET/CT scan is scheduled next month.    Type II or unspecified type diabetes mellitus without mention of complication, not stated as uncontrolled He has resumed his diabetes medications.  Malnutrition He had lost some weight since I saw him. He is experimenting with food. I continue to encourage him to increase oral intake as tolerated and wean off the feeding tube if possible.  Dysphagia This is related to radiation effect. I continue to encourage swallow exercise and follow with a speech therapist.   No orders of the defined types were placed in this encounter.   All questions were answered. The patient knows to call the clinic with any problems, questions or concerns. No barriers to learning was detected. I spent 15 minutes counseling the patient face to face. The total time spent in the appointment was 20 minutes and more than 50% was on counseling and review of test results     [facility], [place], MD 09/15/2013 3:43 PM

## 2013-09-15 NOTE — Telephone Encounter (Signed)
gv adn rpinted appt sched and avs for pt for NOV

## 2013-09-15 NOTE — Assessment & Plan Note (Signed)
He has resumed his diabetes medications.

## 2013-09-15 NOTE — Assessment & Plan Note (Signed)
This is related to radiation effect. I continue to encourage swallow exercise and follow with a speech therapist.

## 2013-09-15 NOTE — Assessment & Plan Note (Signed)
Overall, he is improving and toxicities are resolving. He is seeing the radiation oncologist next month. I will see him back 3 months from now. Continue supportive care. His PET/CT scan is scheduled next month.

## 2013-09-15 NOTE — Assessment & Plan Note (Signed)
He had lost some weight since I saw him. He is experimenting with food. I continue to encourage him to increase oral intake as tolerated and wean off the feeding tube if possible.

## 2013-09-29 ENCOUNTER — Ambulatory Visit: Payer: Self-pay

## 2013-09-29 ENCOUNTER — Ambulatory Visit: Payer: Medicare Other

## 2013-10-06 ENCOUNTER — Ambulatory Visit: Payer: Medicare Other | Attending: Radiation Oncology

## 2013-10-06 ENCOUNTER — Ambulatory Visit: Payer: Medicare Other

## 2013-10-06 DIAGNOSIS — R293 Abnormal posture: Secondary | ICD-10-CM | POA: Insufficient documentation

## 2013-10-06 DIAGNOSIS — R1310 Dysphagia, unspecified: Secondary | ICD-10-CM | POA: Diagnosis not present

## 2013-10-06 DIAGNOSIS — C01 Malignant neoplasm of base of tongue: Secondary | ICD-10-CM | POA: Diagnosis not present

## 2013-10-06 DIAGNOSIS — Z5189 Encounter for other specified aftercare: Secondary | ICD-10-CM | POA: Diagnosis not present

## 2013-10-23 ENCOUNTER — Ambulatory Visit (HOSPITAL_COMMUNITY): Payer: Self-pay

## 2013-10-23 ENCOUNTER — Ambulatory Visit: Payer: Medicare Other

## 2013-10-28 ENCOUNTER — Ambulatory Visit: Payer: Medicare Other | Admitting: Radiation Oncology

## 2013-10-30 ENCOUNTER — Ambulatory Visit (HOSPITAL_COMMUNITY)
Admission: RE | Admit: 2013-10-30 | Discharge: 2013-10-30 | Disposition: A | Discharge status: Home / Self Care | Payer: Medicare Other | Source: Ambulatory Visit | Attending: Radiation Oncology | Admitting: Radiation Oncology

## 2013-10-30 ENCOUNTER — Encounter: Payer: Self-pay | Admitting: Radiation Oncology

## 2013-10-30 ENCOUNTER — Encounter (HOSPITAL_COMMUNITY): Payer: Self-pay

## 2013-10-30 ENCOUNTER — Ambulatory Visit
Admission: RE | Admit: 2013-10-30 | Discharge: 2013-10-30 | Disposition: A | Discharge status: Home / Self Care | Payer: Medicare Other | Source: Ambulatory Visit | Attending: Radiation Oncology | Admitting: Radiation Oncology

## 2013-10-30 DIAGNOSIS — R5381 Other malaise: Secondary | ICD-10-CM | POA: Diagnosis not present

## 2013-10-30 DIAGNOSIS — R5383 Other fatigue: Secondary | ICD-10-CM | POA: Diagnosis not present

## 2013-10-30 DIAGNOSIS — C01 Malignant neoplasm of base of tongue: Secondary | ICD-10-CM | POA: Diagnosis not present

## 2013-10-30 LAB — GLUCOSE, CAPILLARY: Glucose-Capillary: 121 mg/dL — ABNORMAL HIGH (ref 70–99)

## 2013-10-30 MED ORDER — FLUDEOXYGLUCOSE F - 18 (FDG) INJECTION: 7.6500 | Freq: Once | INTRAVENOUS | Status: DC | PRN

## 2013-10-30 NOTE — Progress Notes (Signed)
Called Dr. Gaspar Bidding. He is out for a walk. I let his wife know that his PET scan is clear with no signs of cancer. He's had a complete response to treatment.  She is thrilled and will let him know. I will see them for follow-up next week. -----------------------------------  Eppie Gibson, MD

## 2013-10-31 ENCOUNTER — Telehealth: Payer: Self-pay | Admitting: *Deleted

## 2013-10-31 LAB — TSH CHCC: TSH: 1.497 m(IU)/L (ref 0.320–4.118)

## 2013-10-31 NOTE — Telephone Encounter (Signed)
Called patient to congratulate him on PET results.  He reported he continues to find it difficult to talk and eat.  Maintaining his weight at ca. 150#.  Still using PEG, going to increase cans of supplement from 5 to 6 per day d/t eating challenge. He understands he has a 9/23 1140 appt with Dr. Isidore Moos next week.  Continuing to navigate as L3 (treatments completed) patient.  Gayleen Orem, RN, BSN, [place] at [place] [phone]

## 2013-11-04 ENCOUNTER — Ambulatory Visit: Payer: Medicare Other | Admitting: Radiation Oncology

## 2013-11-04 NOTE — Progress Notes (Signed)
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    Narrative:  The patient returns today for routine follow-up.  He reports dry mouth with the need to use water to help bolus and swallowing soft foods. Enteral nutrition with Glucerna, 5-6 cans daily. Peg tube with reported soreness from tugging. He is walking in a senior olympic team.  He is still following with SLP for dysphagia.  He eats custard and very soft foods.   He has lost ~ 2 lbs since 09/15/13                               ALLERGIES:  has No Known Allergies.  Meds: Current Outpatient Prescriptions  Medication Sig Dispense Refill  . aspirin 81 MG tablet Take 81 mg by mouth daily.      Marland Kitchen atorvastatin (LIPITOR) 20 MG tablet Take 20 mg by mouth daily.      Marland Kitchen glipiZIDE (GLUCOTROL XL) 5 MG 24 hr tablet Take 5 mg by mouth daily.      . metFORMIN (GLUCOPHAGE) 850 MG tablet Take 850 mg by mouth 2 (two) times daily with a meal.      . Nutritional Supplements (GLUCERNA 1.5 CAL) LIQD D/C Osmolite 1.5. Begin Glucerna 1.5 via feeding tube, 1.5 cans QID with 120 ml free water before and after each bolus feeding as tolerated.  1422 mL    . sodium fluoride (FLUORISHIELD) 1.1 % GEL dental gel Place 1 application onto teeth at bedtime.       No current facility-administered medications for this encounter.   Facility-Administered Medications Ordered in Other Encounters  Medication Dose Route Frequency Provider Last Rate  Last Dose  . topical emolient (BIAFINE) emulsion   Topical BID Eppie Gibson, MD        Physical Findings: The patient is in no acute distress. Patient is alert and oriented.  height is 6\' 2"  (1.88 m) and weight is 161 lb 12.8 oz (73.392 kg). His temperature is 97.9 F (36.6 C). His blood pressure is 135/69 and his pulse is 88. Marland Kitchen  Oropharyngeal mucosa is intact with no thrush or lesions. No palpable cervical or supraclavicular lymphadenopathy. Skin intact and smooth over neck.  No sign of PEG infection.   Lab Findings: Lab Results  Component Value Date   WBC 6.5 06/17/2013   HGB 12.6* 06/17/2013   HCT 39.0 06/17/2013   MCV 85.7 06/17/2013   PLT 205 06/17/2013    Lab Results  Component Value Date   TSH 1.497 10/30/2013    Radiographic Findings: Nm Pet Image Restag (ps) Skull Base To Thigh  10/30/2013   CLINICAL DATA:  Subsequent treatment strategy for base of tongue carcinoma.  EXAM: NUCLEAR MEDICINE PET SKULL BASE TO THIGH  TECHNIQUE: 7.7 mCi F-18 FDG was injected intravenously. Full-ring PET imaging was performed from the skull base to thigh after the radiotracer. CT data was obtained and used for attenuation correction  and anatomic localization.  FASTING BLOOD GLUCOSE:  Value: 121 mg/dl  COMPARISON:  04/29/2013  FINDINGS: NECK  Previously seen hypermetabolic mass at the right base of tongue has now resolved. Previously seen hypermetabolic bilateral level 2 cervical lymph nodes have also resolved. No other hypermetabolic lymph nodes or masses identified within the neck.  CHEST  No hypermetabolic mediastinal or hilar nodes. No suspicious pulmonary nodules on the CT scan.  ABDOMEN/PELVIS  No abnormal hypermetabolic activity within the liver, pancreas, adrenal glands, or spleen. No hypermetabolic lymph nodes in the abdomen or pelvis.  SKELETON  No focal hypermetabolic activity to suggest skeletal metastasis.  IMPRESSION: Complete metabolic response to therapy. No evidence of metabolically active  carcinoma.   Electronically Signed   By: Earle Gell M.D.   On: 10/30/2013 12:33    Impression/Plan:    1) Head and Neck Cancer Status: NED - PET scan images reviewed with patient. NCCN guidelines discussed, which recommend surveillance imaging PRN only, for concerning signs/symptoms.  Otherwise, just physical exams are recommended.  The patient would like to only get imaging if signs/symptoms warrant.    2) Nutritional Status:weight stabilizing but will continue to monitor.  Follow with SLP to escalate oral intake   - PEG tube: still using for most of nutrition  3) Risk Factors: The patient has been educated about risk factors including alcohol and tobacco abuse; they understand that avoidance of alcohol and tobacco is important to prevent recurrences as well as other cancers. He is not abusing ETOH or tobacco  4) Swallowing: still difficult.  Continue f/u with SLP. Encouraged to continue swallowing as tolerated to escalate diet and prevent chronic dysphagia.  5) Dental: Encouraged to continue regular followup with dentistry, and dental hygiene including fluoride rinses.   6) Energy: TSH normal, recheck in 6 mo  7) Social: No active social issues to address at this time  8) Other: none  9) Follow-up in 6 months with TSH. He will call Dr Wilburn Cornelia for an appt in 2 mo. F/u with med/onc as scheduled.The patient was encouraged to call with any issues or questions before then.  I spent 25 minutes minutes face to face with the patient and more than 50% of that time was spent in counseling and/or coordination of care. _____________________________________   Eppie Gibson, MD

## 2013-11-05 ENCOUNTER — Ambulatory Visit
Admission: RE | Admit: 2013-11-05 | Discharge: 2013-11-05 | Disposition: A | Discharge status: Home / Self Care | Payer: Medicare Other | Source: Ambulatory Visit | Attending: Radiation Oncology | Admitting: Radiation Oncology

## 2013-11-05 VITALS — BP 135/69 | HR 88 | Temp 97.9°F | Ht 74.0 in | Wt 161.8 lb

## 2013-11-05 DIAGNOSIS — R634 Abnormal weight loss: Secondary | ICD-10-CM

## 2013-11-05 DIAGNOSIS — C01 Malignant neoplasm of base of tongue: Secondary | ICD-10-CM | POA: Diagnosis not present

## 2013-11-05 NOTE — Progress Notes (Signed)
Brent Franklin is here for reassessment s/p radiation therapy  For squamous cell carcinoma of the base of tongue.  He reports dry mouth with the need to use water to help bolus and swallowing soft foods.  Enteral nutrition with Glucerna, 5-6 cans daily. Peg tube with reported soreness from tugging.  Oral cavity intact and note brown color to his tongue today.  He has lost ~ 2 lbs since 09/15/13.

## 2013-11-06 ENCOUNTER — Encounter: Payer: Self-pay | Admitting: Radiation Oncology

## 2013-11-07 ENCOUNTER — Ambulatory Visit: Payer: Medicare Other | Admitting: Radiation Oncology

## 2013-11-13 ENCOUNTER — Telehealth: Payer: Self-pay | Admitting: Hematology and Oncology

## 2013-11-13 NOTE — Telephone Encounter (Signed)
s.w. pt and advised on nut appt 10.6.15...Marland Kitchenpt ok and aware

## 2013-11-18 ENCOUNTER — Encounter: Payer: Self-pay | Admitting: Nutrition

## 2013-11-21 ENCOUNTER — Encounter: Payer: Self-pay | Admitting: Nutrition

## 2013-11-21 ENCOUNTER — Ambulatory Visit: Payer: Medicare Other | Admitting: Nutrition

## 2013-11-21 DIAGNOSIS — Z431 Encounter for attention to gastrostomy: Secondary | ICD-10-CM | POA: Diagnosis not present

## 2013-11-21 NOTE — Progress Notes (Addendum)
Patient presents to nutrition followup.  Patient reports he is using 5 cans of Glucerna 1.5 via feeding tube daily.  He is eating very small amounts of soft foods at mealtimes. States feeding tube fell out this morning in the shower, however, he replaced it and is seeing interventional radiology later today. Patient would like to increase oral intake so that feeding tube can be discontinued. Patient reports he has no appetite and does not enjoy eating which makes it difficult to increase oral intake.    Weight documented as 162 pounds October 9.  Stable from 161.8 pounds September 23.  Nutrition diagnosis: Inadequate oral intake continues.  Intervention: Reviewed soft, moist foods for patient to try at mealtimes. Provided patient with samples of oral nutrition supplements. Patient refuses to drink Glucerna 1.5. Recommended patient change times of bolus feedings and provide 1-1/2 cans 3 times a day between meals to improve appetite. Questions were answered.  Teach back method used.  Monitoring, evaluation, goals: Patient will increase oral intake and decrease tube feedings while maintaining weight.  Next visit: Continue to follow patient for any needs.  **Disclaimer: This note was dictated with voice recognition software. Similar sounding words can inadvertently be transcribed and this note may contain transcription errors which may not have been corrected upon publication of note.**

## 2013-11-25 DIAGNOSIS — Z23 Encounter for immunization: Secondary | ICD-10-CM | POA: Diagnosis not present

## 2013-11-27 ENCOUNTER — Telehealth: Payer: Self-pay | Admitting: *Deleted

## 2013-11-27 NOTE — Telephone Encounter (Signed)
Called Dr. Gaspar Bidding to check on his well being.  He reported: 1. Lack of appetite despite baseline physical activity, regular exercise 2. Some swallowing difficulty 3. Eating soft foods (scrambled eggs), soup, salad, small pieces of meat; unable to eat bread d/t lack o saliva. 4. Instilling 5 cans of nutritional supplement daily 5. Moderate return of taste buds 6. Stable weight around 154 lbs He recognized that he does not want to still be using feeding tube.  He stated he has not scheduled f/u appt with Dr. Wilburn Cornelia and understands the value of his assessment of swallowing issues.  I encouraged him to see Dr. Wilburn Cornelia.  Gayleen Orem, RN, BSN, [place] at [place] [phone]

## 2013-12-01 ENCOUNTER — Ambulatory Visit: Payer: Self-pay

## 2013-12-04 ENCOUNTER — Telehealth: Payer: Self-pay | Admitting: Hematology and Oncology

## 2013-12-04 NOTE — Telephone Encounter (Signed)
returned pt call and r/s appt time per pt request....pt ok adn aware

## 2013-12-15 ENCOUNTER — Encounter: Payer: Self-pay | Admitting: Hematology and Oncology

## 2013-12-15 ENCOUNTER — Ambulatory Visit: Payer: Self-pay | Admitting: Hematology and Oncology

## 2013-12-15 ENCOUNTER — Ambulatory Visit (HOSPITAL_BASED_OUTPATIENT_CLINIC_OR_DEPARTMENT_OTHER): Payer: Medicare Other | Admitting: Hematology and Oncology

## 2013-12-15 VITALS — BP 134/66 | HR 95 | Temp 98.4°F | Resp 18 | Ht 74.0 in | Wt 162.0 lb

## 2013-12-15 DIAGNOSIS — Z931 Gastrostomy status: Secondary | ICD-10-CM | POA: Diagnosis not present

## 2013-12-15 DIAGNOSIS — C01 Malignant neoplasm of base of tongue: Secondary | ICD-10-CM | POA: Diagnosis not present

## 2013-12-15 DIAGNOSIS — E46 Unspecified protein-calorie malnutrition: Secondary | ICD-10-CM

## 2013-12-15 DIAGNOSIS — R1310 Dysphagia, unspecified: Secondary | ICD-10-CM

## 2013-12-15 NOTE — Assessment & Plan Note (Signed)
He is able to maintain his weight around 160 pounds. He is dependent almost fully to his feeding tube and is only able to get 10% of his calorie intake by mouth. I continue to encourage the patient to eat orally if possible.

## 2013-12-15 NOTE — Assessment & Plan Note (Signed)
He has persistent dysphagia and refused follow-up with speech and language therapist and modified barium swallow. The patient planned to discuss with ENT surgeon to assess the cause of his dysphagia. I am wondering whether they might be a mechanical problem that is amenable to esophageal dilatation but he declined further workup for now.

## 2013-12-15 NOTE — Assessment & Plan Note (Signed)
Overall, he is improving and toxicities are resolving. He is seeing the radiation oncologist next year I will see him back 6 months from now. Continue supportive care.

## 2013-12-15 NOTE — Assessment & Plan Note (Signed)
His feeding tube fell out last month and was replaced. His feeding tube appears to be functioning normally.

## 2013-12-15 NOTE — Progress Notes (Signed)
[facility] OFFICE PROGRESS NOTE  Patient Care Team: Tivis Ringer, MD as PCP - General (Internal Medicine) Brooks Sailors, RN as Registered Nurse (Oncology) Heath Lark, MD as Consulting Physician (Hematology and Oncology)  SUMMARY OF ONCOLOGIC HISTORY: Oncology History   Base of tongue cancer, HPV positive   Primary site: Lip and Oral Cavity (Bilateral)   Staging method: AJCC 7th Edition   Clinical: Stage IVA (T2, N2c, M0) signed by Heath Lark, MD on 04/18/2013  2:21 PM   Summary: Stage IVA (T2, N2c, M0)       Malignant neoplasm of base of tongue   04/01/2013 Imaging Ct scan of neck showed base of tongue cancer involving vallecula and bilateral LN metastasis   04/15/2013 Surgery Laryngoscopy and biopsy 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   05/05/2013 Procedure The patient had placement of port and feeding tube.   05/07/2013 - 06/25/2013 Radiation Therapy The patient received radiation therapy   05/07/2013 - 06/11/2013 Chemotherapy Patient received weekly cetuximab. He received 6 out of planned 7 treatment. The last treatment was not given due to severe oral ulcerations.   05/14/2013 Adverse Reaction Grade 1 to 2 skin rash is noted   10/30/2013 Imaging repeat PET/CT scan show complete response to treatment    INTERVAL HISTORY: Please see below for problem oriented charting. He is seen as part of his supportive care visit. His feeding tube fell out last week and was replaced. He is dependent on feeding tube and is able to tolerate only minor intake by mouth. The patient have problems swallowing food but insisted the problem is due to lack of saliva. He had one episode of choking recently while eating eggs. He has difficulty swallowing pills. He has  some minor neck movement looking up  REVIEW OF SYSTEMS:   Constitutional: Denies fevers, chills or abnormal weight loss. Eyes: Denies blurriness of vision Ears, nose, mouth, throat, and face: Denies mucositis or sore throat Respiratory: Denies cough, dyspnea or wheezes Cardiovascular: Denies palpitation, chest discomfort or lower extremity swelling Gastrointestinal:  Denies nausea, heartburn or change in bowel habits Skin: Denies abnormal skin rashes Lymphatics: Denies new lymphadenopathy or easy bruising Neurological:Denies numbness, tingling or new weaknesses Behavioral/Psych: Mood is stable, no new changes  All other systems were reviewed with the patient and are negative.  I have reviewed the past medical history, past surgical history, social history and family history with the patient and they are unchanged from previous note.  ALLERGIES:  has No Known Allergies.  MEDICATIONS:  Current Outpatient Prescriptions  Medication Sig Dispense Refill  . aspirin 81 MG tablet Take 81 mg by mouth daily.    Marland Kitchen atorvastatin (LIPITOR) 20 MG tablet Take 20 mg by mouth daily.    Marland Kitchen glipiZIDE (GLUCOTROL XL) 5 MG 24 hr tablet Take 5 mg by mouth daily.    . metFORMIN (GLUCOPHAGE) 850 MG tablet Take 850 mg by mouth 2 (two) times daily with a meal.    . Nutritional Supplements (GLUCERNA 1.5 CAL) LIQD D/C Osmolite 1.5. Begin Glucerna 1.5 via feeding tube, 1.5 cans QID with 120 ml free water before and after each bolus feeding as tolerated. 1422 mL   . sodium fluoride (FLUORISHIELD) 1.1 % GEL dental gel Place 1 application onto teeth at bedtime.     No current  facility-administered medications for this visit.   Facility-Administered Medications Ordered in Other Visits  Medication Dose Route Frequency Provider Last Rate Last Dose  . topical emolient (BIAFINE) emulsion   Topical BID Eppie Gibson, MD        PHYSICAL EXAMINATION: ECOG PERFORMANCE STATUS: 0 - Asymptomatic  Filed Vitals:   12/15/13  1501  BP: 134/66  Pulse: 95  Temp: 98.4 F (36.9 C)  Resp: 18   Filed Weights   12/15/13 1501  Weight: 162 lb (73.483 kg)    GENERAL:alert, no distress and comfortable SKIN: skin color, texture, turgor are normal, no rashes or significant lesions EYES: normal, Conjunctiva are pink and non-injected, sclera clear OROPHARYNX:no exudate, no erythema and lips, buccal mucosa, and tongue normal  NECK: neck is thick from radiation fibrosis. I notice some limitation of movement around his neck. LYMPH:  no palpable lymphadenopathy in the cervical, axillary or inguinal LUNGS: clear to auscultation and percussion with normal breathing effort HEART: regular rate & rhythm and no murmurs and no lower extremity edema ABDOMEN:abdomen soft, non-tender and normal bowel sounds. Feeding tube site looks okay Musculoskeletal:no cyanosis of digits and no clubbing  NEURO: alert & oriented x 3 with fluent speech, no focal motor/sensory deficits  LABORATORY DATA:  I have reviewed the data as listed    Component Value Date/Time   NA 141 06/17/2013 1405   NA 143 04/29/2013 0844   K 4.9 06/17/2013 1405   K 4.3 04/29/2013 0844   CL 103 04/29/2013 0844   CO2 28 06/17/2013 1405   CO2 29 04/29/2013 0844   GLUCOSE 179* 06/17/2013 1405   GLUCOSE 106* 04/29/2013 0844   BUN 25.5 06/17/2013 1405   BUN 21 04/29/2013 0844   CREATININE 1.0 06/17/2013 1405   CREATININE 0.98 04/29/2013 0844   CALCIUM 9.1 06/17/2013 1405   CALCIUM 9.4 04/29/2013 0844   PROT 6.2* 06/17/2013 1405   PROT 6.7 04/29/2013 0844   ALBUMIN 2.9* 06/17/2013 1405   ALBUMIN 3.8 04/29/2013 0844   AST 30 06/17/2013 1405   AST 25 04/29/2013 0844   ALT 48 06/17/2013 1405   ALT 28 04/29/2013 0844   ALKPHOS 135 06/17/2013 1405   ALKPHOS 103 04/29/2013 0844   BILITOT 0.57 06/17/2013 1405   BILITOT 0.6 04/29/2013 0844   GFRNONAA 76* 04/29/2013 0844   GFRAA 88* 04/29/2013 0844    No results found for: SPEP, UPEP  Lab Results  Component  Value Date   WBC 6.5 06/17/2013   NEUTROABS 4.6 06/17/2013   HGB 12.6* 06/17/2013   HCT 39.0 06/17/2013   MCV 85.7 06/17/2013   PLT 205 06/17/2013      Chemistry      Component Value Date/Time   NA 141 06/17/2013 1405   NA 143 04/29/2013 0844   K 4.9 06/17/2013 1405   K 4.3 04/29/2013 0844   CL 103 04/29/2013 0844   CO2 28 06/17/2013 1405   CO2 29 04/29/2013 0844   BUN 25.5 06/17/2013 1405   BUN 21 04/29/2013 0844   CREATININE 1.0 06/17/2013 1405   CREATININE 0.98 04/29/2013 0844      Component Value Date/Time   CALCIUM 9.1 06/17/2013 1405   CALCIUM 9.4 04/29/2013 0844   ALKPHOS 135 06/17/2013 1405   ALKPHOS 103 04/29/2013 0844   AST 30 06/17/2013 1405   AST 25 04/29/2013 0844   ALT 48 06/17/2013 1405   ALT 28 04/29/2013 0844   BILITOT 0.57 06/17/2013 1405   BILITOT 0.6 04/29/2013 0844  ASSESSMENT & PLAN:  Malignant neoplasm of base of tongue Overall, he is improving and toxicities are resolving. He is seeing the radiation oncologist next year I will see him back 6 months from now. Continue supportive care.      Dysphagia He has persistent dysphagia and refused follow-up with speech and language therapist and modified barium swallow. The patient planned to discuss with ENT surgeon to assess the cause of his dysphagia. I am wondering whether they might be a mechanical problem that is amenable to esophageal dilatation but he declined further workup for now.  Malnutrition He is able to maintain his weight around 160 pounds. He is dependent almost fully to his feeding tube and is only able to get 10% of his calorie intake by mouth. I continue to encourage the patient to eat orally if possible.  S/P percutaneous endoscopic gastrostomy (PEG) tube placement His feeding tube fell out last month and was replaced. His feeding tube appears to be functioning normally.   No orders of the defined types were placed in this encounter.   All questions were  answered. The patient knows to call the clinic with any problems, questions or concerns. No barriers to learning was detected. I spent 25 minutes counseling the patient face to face. The total time spent in the appointment was 30 minutes and more than 50% was on counseling and review of test results     [facility], [place], MD 12/15/2013 3:26 PM

## 2013-12-16 ENCOUNTER — Telehealth: Payer: Self-pay | Admitting: Hematology and Oncology

## 2013-12-16 NOTE — Telephone Encounter (Signed)
Confirm May 2016 appt d/t. Mailed cal.

## 2014-01-12 ENCOUNTER — Telehealth: Payer: Self-pay | Admitting: Medical Oncology

## 2014-01-12 ENCOUNTER — Other Ambulatory Visit: Payer: Self-pay | Admitting: Hematology and Oncology

## 2014-01-12 NOTE — Telephone Encounter (Signed)
Prescription refill request for Ambien received by pharmacy. Call to patient, he confirms he did request a refill. Per MD, ok to refill. Refill request for Ambien 10 mg phoned in to patient's requested pharmacy. Patient informed, expresses thanks. Denies questions at this time.

## 2014-01-22 DIAGNOSIS — K117 Disturbances of salivary secretion: Secondary | ICD-10-CM | POA: Diagnosis not present

## 2014-01-22 DIAGNOSIS — Z931 Gastrostomy status: Secondary | ICD-10-CM | POA: Diagnosis not present

## 2014-01-22 DIAGNOSIS — R4702 Dysphasia: Secondary | ICD-10-CM | POA: Diagnosis not present

## 2014-01-22 DIAGNOSIS — Z85810 Personal history of malignant neoplasm of tongue: Secondary | ICD-10-CM | POA: Diagnosis not present

## 2014-03-02 DIAGNOSIS — E11329 Type 2 diabetes mellitus with mild nonproliferative diabetic retinopathy without macular edema: Secondary | ICD-10-CM | POA: Diagnosis not present

## 2014-03-02 DIAGNOSIS — H43813 Vitreous degeneration, bilateral: Secondary | ICD-10-CM | POA: Diagnosis not present

## 2014-04-20 ENCOUNTER — Encounter: Payer: Self-pay | Admitting: *Deleted

## 2014-04-22 ENCOUNTER — Ambulatory Visit: Payer: Medicare Other

## 2014-04-22 ENCOUNTER — Ambulatory Visit: Payer: Medicare Other | Admitting: Radiation Oncology

## 2014-04-27 ENCOUNTER — Other Ambulatory Visit (HOSPITAL_COMMUNITY): Payer: Self-pay | Admitting: Dentistry

## 2014-04-27 MED ORDER — SODIUM FLUORIDE 1.1 % DT GEL: DENTAL | Status: DC

## 2014-05-08 ENCOUNTER — Ambulatory Visit
Admission: RE | Admit: 2014-05-08 | Discharge: 2014-05-08 | Disposition: A | Discharge status: Home / Self Care | Payer: Medicare Other | Source: Ambulatory Visit | Attending: Radiation Oncology | Admitting: Radiation Oncology

## 2014-05-08 ENCOUNTER — Encounter: Payer: Self-pay | Admitting: Radiation Oncology

## 2014-05-08 VITALS — BP 131/59 | HR 66 | Temp 98.0°F | Resp 20 | Wt 160.2 lb

## 2014-05-08 DIAGNOSIS — R682 Dry mouth, unspecified: Secondary | ICD-10-CM

## 2014-05-08 DIAGNOSIS — K117 Disturbances of salivary secretion: Secondary | ICD-10-CM

## 2014-05-08 DIAGNOSIS — C01 Malignant neoplasm of base of tongue: Secondary | ICD-10-CM

## 2014-05-08 DIAGNOSIS — R634 Abnormal weight loss: Secondary | ICD-10-CM

## 2014-05-08 MED ORDER — PILOCARPINE HCL 5 MG PO TABS: ORAL_TABLET | ORAL | Status: DC

## 2014-05-08 NOTE — Progress Notes (Signed)
Pain Status: no  Weight changes, if any: stable  Nutritional Status a) intake:  A few soft foods, loss of appetite due to totally dry mouth b) using a feeding tube?: Glucerna 5 cans daly through peg tube c) weight changes, if any:   Swallowing Status: totally dry mouth, eats a few soft foods, foods taste "strange"   Dental (if applicable): When was last visit with dentistry  Has had two recent dental visits, next one in May.  Using fluoride trays daily? yes   When was last ENT visit?   01/22/14  Dr Wilburn Cornelia When is next ENT visit?  None scheduled  Other notable issues, if any: denies fatigue

## 2014-05-08 NOTE — Progress Notes (Signed)
Radiation Oncology         [phone]) [phone] ________________________________  Name: Brent Franklin MRN: [id number]  Date: 05/08/2014  DOB: 1931/07/16  Follow-Up Visit Note  CC: Tivis Ringer, MD  Heath Lark, MD   ICD-9-CM ICD-10-CM   1. Malignant neoplasm of base of tongue 141.0 C01   2. 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    Narrative:  The patient returns today for routine follow-up.    Pain Status: no  Weight changes, if any: stable  Nutritional Status a) intake:  A few soft foods, loss of appetite due to totally dry mouth b) using a feeding tube?: Glucerna 5 cans daly through peg tube   Swallowing Status: totally dry mouth, eats a few soft foods, foods taste "strange"   Dental (if applicable): When was last visit with dentistry  Has had two recent dental visits, next one in May. Using fluoride trays daily? yes   When was last ENT visit?   01/22/14  Dr Wilburn Cornelia When is next ENT visit?  None scheduled  Other notable issues, if any: denies fatigue. Main complaint is xerostomia in mouth and throat                               ALLERGIES:  has No Known Allergies.  Meds: Current Outpatient Prescriptions  Medication Sig Dispense Refill  . aspirin 81 MG tablet Take 81 mg by mouth daily.    Marland Kitchen glipiZIDE (GLUCOTROL XL) 5 MG 24 hr tablet Take 5 mg by mouth daily.    . metFORMIN (GLUCOPHAGE) 850 MG tablet Take 850 mg by mouth 2 (two) times daily with a meal.    . Nutritional Supplements (GLUCERNA 1.5 CAL) LIQD D/C Osmolite 1.5. Begin Glucerna 1.5 via feeding tube, 1.5 cans QID with 120 ml free water before and after each  bolus feeding as tolerated. 1422 mL   . sodium fluoride (FLUORISHIELD) 1.1 % GEL dental gel Instill one drop of gel per tooth space of fluoride tray. Place over teeth for 5 minutes. Remove. Spit out excess. Repeat nightly. 120 mL prn  . atorvastatin (LIPITOR) 20 MG tablet Take 20 mg by mouth daily.    . pilocarpine (SALAGEN) 5 MG tablet Take 1 tablet TID, may increase to 2 tablets per dose at your discretion. 100 tablet 3   No current facility-administered medications for this encounter.   Facility-Administered Medications Ordered in Other Encounters  Medication Dose Route Frequency Provider Last Rate Last Dose  . topical emolient (BIAFINE) emulsion   Topical BID Eppie Gibson, MD        Physical Findings: The patient is in no acute distress. Patient is alert and oriented.  weight is 160 lb 3.2 oz (72.666 kg). His temperature is 98 F (36.7 C). His blood pressure is 131/59 and his pulse is 66. His respiration is 20. Marland Kitchen  Oropharyngeal mucosa is intact with no thrush or lesions. Significant xerostomia. No palpable cervical or supraclavicular lymphadenopathy. Skin intact and smooth over neck with anterior modest lymphedema.  No sign of PEG infection.   Lab Findings: Lab Results  Component Value  Date   WBC 6.5 06/17/2013   HGB 12.6* 06/17/2013   HCT 39.0 06/17/2013   MCV 85.7 06/17/2013   PLT 205 06/17/2013    Lab Results  Component Value Date   TSH 1.497 10/30/2013    Radiographic Findings: none  Impression/Plan:    1) Head and Neck Cancer Status: NED - PET scan images reviewed with patient. NCCN guidelines discussed, which recommend surveillance imaging PRN only, for concerning signs/symptoms.  Otherwise, just physical exams are recommended.  The patient would like to only get imaging if signs/symptoms warrant.    2) Nutritional Status: Weight stable but mostly PEG dependent.  Cannot swallow solids due to lack of saliva.  Will try Salagen.   3) Risk Factors: The patient has been  educated about risk factors including alcohol and tobacco abuse; they understand that avoidance of alcohol and tobacco is important to prevent recurrences as well as other cancers. He is not abusing ETOH or tobacco  4) Swallowing:  Cannot swallow solids due to lack of saliva.  Will try Salagen.  5) Dental: Encouraged to continue regular followup with dentistry, and dental hygiene including fluoride rinses.   6) Energy: TSH pending  7) Social: No active social issues to address at this time  8) Other: Salagen as above for xerostomia  9) Follow-up in 6 months. He will defers f/u w/ Dr Wilburn Cornelia unless he has new symptoms, though he knows I recommend he see him in 65mo for an exam/surveillance. The patient was encouraged to call with any issues or questions before then.    _____________________________________   Eppie Gibson, MD

## 2014-05-11 ENCOUNTER — Telehealth: Payer: Self-pay | Admitting: *Deleted

## 2014-05-11 LAB — TSH CHCC: TSH: 2.418 m(IU)/L (ref 0.320–4.118)

## 2014-05-11 NOTE — Telephone Encounter (Signed)
Per Dr Isidore Moos, spoke with patient and gave him his exact TSH result from lab drawn on 05/08/14 which is within the normal range.  Brent Franklin verbalized appreciation and understanding.

## 2014-05-11 NOTE — Telephone Encounter (Signed)
Received message from patient stating Brent Franklin co pay for Pilocarpine was unaffordable for him. He requests this medication be called to [facility] or the prescription be mailed to him. Called patient and informed him that this RN attempted to phone in script but was unsuccessful. Informed him the script is being mailed to him today. He verbalized understanding.

## 2014-06-09 DIAGNOSIS — R221 Localized swelling, mass and lump, neck: Secondary | ICD-10-CM | POA: Diagnosis not present

## 2014-06-09 DIAGNOSIS — R4702 Dysphasia: Secondary | ICD-10-CM | POA: Diagnosis not present

## 2014-06-09 DIAGNOSIS — K117 Disturbances of salivary secretion: Secondary | ICD-10-CM | POA: Diagnosis not present

## 2014-06-09 DIAGNOSIS — R220 Localized swelling, mass and lump, head: Secondary | ICD-10-CM | POA: Diagnosis not present

## 2014-06-09 DIAGNOSIS — Z931 Gastrostomy status: Secondary | ICD-10-CM | POA: Diagnosis not present

## 2014-06-10 ENCOUNTER — Other Ambulatory Visit: Payer: Self-pay | Admitting: Otolaryngology

## 2014-06-10 DIAGNOSIS — K148 Other diseases of tongue: Secondary | ICD-10-CM

## 2014-06-10 DIAGNOSIS — R220 Localized swelling, mass and lump, head: Secondary | ICD-10-CM

## 2014-06-10 DIAGNOSIS — R599 Enlarged lymph nodes, unspecified: Secondary | ICD-10-CM

## 2014-06-11 ENCOUNTER — Telehealth: Payer: Self-pay | Admitting: Hematology and Oncology

## 2014-06-11 NOTE — Telephone Encounter (Signed)
returned call no voicemail......did not cx/r.s appt....will confirm when pt calls back

## 2014-06-12 ENCOUNTER — Telehealth: Payer: Self-pay | Admitting: Hematology and Oncology

## 2014-06-12 NOTE — Telephone Encounter (Signed)
s.w pt and confirm cx apt...he said he did not need

## 2014-06-15 ENCOUNTER — Ambulatory Visit: Payer: Self-pay | Admitting: Hematology and Oncology

## 2014-06-19 ENCOUNTER — Other Ambulatory Visit: Payer: Self-pay

## 2014-06-19 ENCOUNTER — Inpatient Hospital Stay: Admission: RE | Admit: 2014-06-19 | Payer: Self-pay | Source: Ambulatory Visit

## 2014-06-19 DIAGNOSIS — Z85810 Personal history of malignant neoplasm of tongue: Secondary | ICD-10-CM | POA: Diagnosis not present

## 2014-06-22 ENCOUNTER — Ambulatory Visit
Admission: RE | Admit: 2014-06-22 | Discharge: 2014-06-22 | Disposition: A | Discharge status: Home / Self Care | Payer: Medicare Other | Source: Ambulatory Visit | Attending: Otolaryngology | Admitting: Otolaryngology

## 2014-06-22 DIAGNOSIS — K148 Other diseases of tongue: Secondary | ICD-10-CM

## 2014-06-22 DIAGNOSIS — R599 Enlarged lymph nodes, unspecified: Secondary | ICD-10-CM

## 2014-06-22 DIAGNOSIS — Z431 Encounter for attention to gastrostomy: Secondary | ICD-10-CM | POA: Diagnosis not present

## 2014-06-22 DIAGNOSIS — R221 Localized swelling, mass and lump, neck: Secondary | ICD-10-CM | POA: Diagnosis not present

## 2014-06-22 DIAGNOSIS — Z85810 Personal history of malignant neoplasm of tongue: Secondary | ICD-10-CM | POA: Diagnosis not present

## 2014-06-22 DIAGNOSIS — R220 Localized swelling, mass and lump, head: Secondary | ICD-10-CM

## 2014-06-22 MED ORDER — IOPAMIDOL (ISOVUE-300) INJECTION 61%
75.0000 mL | Freq: Once | INTRAVENOUS | Status: AC | PRN
  Administered 2014-06-22: 75 mL via INTRAVENOUS

## 2014-06-26 DIAGNOSIS — Z431 Encounter for attention to gastrostomy: Secondary | ICD-10-CM | POA: Diagnosis not present

## 2014-10-06 DIAGNOSIS — D0439 Carcinoma in situ of skin of other parts of face: Secondary | ICD-10-CM | POA: Diagnosis not present

## 2014-10-06 DIAGNOSIS — L570 Actinic keratosis: Secondary | ICD-10-CM | POA: Diagnosis not present

## 2014-10-06 DIAGNOSIS — C44321 Squamous cell carcinoma of skin of nose: Secondary | ICD-10-CM | POA: Diagnosis not present

## 2014-10-16 ENCOUNTER — Ambulatory Visit: Payer: Self-pay | Admitting: Radiation Oncology

## 2014-11-02 DIAGNOSIS — C44321 Squamous cell carcinoma of skin of nose: Secondary | ICD-10-CM | POA: Diagnosis not present

## 2014-11-06 ENCOUNTER — Ambulatory Visit
Admission: RE | Admit: 2014-11-06 | Discharge: 2014-11-06 | Disposition: A | Discharge status: Home / Self Care | Payer: Medicare Other | Source: Ambulatory Visit | Attending: Radiation Oncology | Admitting: Radiation Oncology

## 2014-11-10 NOTE — Progress Notes (Signed)
Pain issues, if any: none Using a feeding tube?: yes, he uses osmolite at 5 cans a day Weight changes, if any:  Wt Readings from Last 3 Encounters:  11/11/14 165 lb 1.6 oz (74.889 kg)  05/08/14 160 lb 3.2 oz (72.666 kg)  12/15/13 162 lb (73.483 kg)   Swallowing issues, if any: He must chew food completely as he has no saliva glands to help moisten food. He does eat soft items. Smoking or chewing tobacco? no Using fluoride trays daily? He uses a flora shield Last ENT visit was on: 4/46/16 Other notable issues, if any:

## 2014-11-11 ENCOUNTER — Encounter: Payer: Self-pay | Admitting: Radiation Oncology

## 2014-11-11 ENCOUNTER — Encounter: Payer: Self-pay | Admitting: *Deleted

## 2014-11-11 ENCOUNTER — Ambulatory Visit
Admission: RE | Admit: 2014-11-11 | Discharge: 2014-11-11 | Disposition: A | Discharge status: Home / Self Care | Payer: Medicare Other | Source: Ambulatory Visit | Attending: Radiation Oncology | Admitting: Radiation Oncology

## 2014-11-11 VITALS — BP 149/75 | HR 78 | Temp 97.8°F | Ht 74.0 in | Wt 165.1 lb

## 2014-11-11 DIAGNOSIS — C01 Malignant neoplasm of base of tongue: Secondary | ICD-10-CM | POA: Diagnosis not present

## 2014-11-11 DIAGNOSIS — R635 Abnormal weight gain: Secondary | ICD-10-CM

## 2014-11-11 NOTE — Progress Notes (Signed)
Radiation Oncology         ([phone]) [phone] ________________________________  Name: Brent Franklin MRN: [id number]  Date: 11/11/2014  DOB: 12-11-1931  Follow-Up Visit Note  CC: Tivis Ringer, MD  Heath Lark, MD   ICD-9-CM ICD-10-CM   1. 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    Narrative:  The patient returns today for routine follow-up.   Continued xerostomia, refractory to Pilocarpine/Biotene. Chooses to supplement with PEG tube due to dysgeusia and dry mouth (eating is a chore).  Had an enlarged submandibular gland worked up with CT earlier this year, not a clinical concern to Dr. Music therapist.   ALLERGIES:  has No Known Allergies.  Meds: Current Outpatient Prescriptions  Medication Sig Dispense Refill  . aspirin 81 MG tablet Take 81 mg by mouth daily.    Marland Kitchen glipiZIDE (GLUCOTROL XL) 5 MG 24 hr tablet Take 5 mg by mouth daily.    . metFORMIN (GLUCOPHAGE) 850 MG tablet Take 850 mg by mouth 2 (two) times daily with a meal.    . Nutritional Supplements (GLUCERNA 1.5 CAL) LIQD D/C Osmolite 1.5. Begin Glucerna 1.5 via feeding tube, 1.5 cans QID with 120 ml free water before and after each bolus feeding as tolerated. 1422 mL   . sodium fluoride (FLUORISHIELD) 1.1 % GEL dental gel Instill one drop of gel per tooth space of fluoride tray. Place over teeth for 5 minutes. Remove. Spit out excess. Repeat nightly. 120 mL prn   No current facility-administered medications for this encounter.   Facility-Administered Medications Ordered in Other Encounters  Medication Dose Route Frequency Provider Last Rate  Last Dose  . topical emolient (BIAFINE) emulsion   Topical BID Eppie Gibson, MD        Physical Findings: The patient is in no acute distress. Patient is alert and oriented.  height is 6\' 2"  (1.88 m) and weight is 165 lb 1.6 oz (74.889 kg). His temperature is 97.8 F (36.6 C). His blood pressure is 149/75 and his pulse is 78. Marland Kitchen  Oropharyngeal mucosa is dry with no thrush or lesions. Significant xerostomia. No palpable cervical or supraclavicular lymphadenopathy. Skin intact and smooth over neck. Heart: RRR no murmurs Chest: CTAB PEG tube present  Lab Findings: Lab Results  Component Value Date   WBC 6.5 06/17/2013   HGB 12.6* 06/17/2013   HCT 39.0 06/17/2013   MCV 85.7 06/17/2013   PLT 205 06/17/2013    Lab Results  Component Value Date   TSH 2.418 05/08/2014    Radiographic Findings: none  Impression/Plan:    1) Head and Neck Cancer Status: NED  2) Nutritional Status: no issues, still using PEG   3) Risk Factors: The patient has been educated about risk factors including alcohol and tobacco abuse; they understand that avoidance of alcohol and tobacco is important to prevent recurrences as well as other cancers. He is not abusing ETOH or tobacco  4) Swallowing:  Functional but inhibited by xerostomia, has tried biotene and salagen  5) Encouraged to continue regular followup with dentistry, and dental hygiene including fluoride rinses.   6) Energy:  Lab Results  Component Value Date  TSH 2.418 05/08/2014    7) Social: No active social issues to address at this time  8) Stable xerostomia - as above  9) Follow-up in 12 mo; he knows to call if he has new symptoms, though he knows I recommend I see him in sooner than 12 mo for an exam/surveillance. The patient was encouraged to call with any issues or questions before then. He wishes to deviate from national guidelines that I discussed for timing of followups.  He will see ENT prn per his wishes.  He will be a little  overdue for annual TSH at 12 mo follow-up.  I will ask Gayleen Orem, RN, our Head and Neck Oncology Navigator to call patient back to recommend another TSH in 4-6 months from now with his PCP as the RT heightened his risk for hypothyroidism.   _____________________________________   Eppie Gibson, MD

## 2014-11-11 NOTE — Progress Notes (Signed)
  Oncology Nurse Navigator Documentation   Navigator Encounter Type: Clinic/MDC (11/11/14 1600) Patient Visit Type: Radonc (11/11/14 1600)     To provide care continuity and to assess for needs, met with patient during f/u appt with Dr. Isidore Moos. He continues to rely on PEG for nutritional supplementation though eating some soft foods, salads, soups. Reports dry mouth, unable to eat breads, fried shrimp; "no pleasure" in eating. He did not express any needs or concerns at this time, I encouraged him to contact me if that changes, he verbalized understanding.  Gayleen Orem, RN, BSN, [place] at [place] [phone]                    Time Spent with Patient: 30 (11/11/14 1600)

## 2014-11-12 ENCOUNTER — Telehealth: Payer: Self-pay | Admitting: *Deleted

## 2014-11-12 NOTE — Telephone Encounter (Signed)
CALLED PATIENT TO INFORM OF LAB ON 11-10-15 @ 10:30 AM PRIOR TO FU ON 11-10-15 @ 11 AM, SPOKE WITH PATIENT AND HE IS AWARE OF THIS APPT.

## 2014-11-13 ENCOUNTER — Telehealth: Payer: Self-pay | Admitting: *Deleted

## 2014-11-13 NOTE — Telephone Encounter (Signed)
  Oncology Nurse Navigator Documentation   Navigator Encounter Type: Telephone (11/13/14 1315)   Treatment Phase: Other (11/13/14 1315)     Per Dr. Isidore Moos, called patient with her recommendation to have TSH checked in 4-6 months with PCP.  He verbalized understanding.  Gayleen Orem, RN, BSN, [facility] at [place] [phone]                 Time Spent with Patient: 15 (11/13/14 1315)

## 2014-11-24 DIAGNOSIS — Z431 Encounter for attention to gastrostomy: Secondary | ICD-10-CM | POA: Diagnosis not present

## 2014-11-24 DIAGNOSIS — Z23 Encounter for immunization: Secondary | ICD-10-CM | POA: Diagnosis not present

## 2015-02-23 MED FILL — FLUORISHIELD 1.1% GEL: 1.1 % | 60 days supply | Qty: 228 | Fill #5

## 2015-03-11 DIAGNOSIS — H903 Sensorineural hearing loss, bilateral: Secondary | ICD-10-CM | POA: Diagnosis not present

## 2015-03-17 DIAGNOSIS — C01 Malignant neoplasm of base of tongue: Secondary | ICD-10-CM | POA: Diagnosis not present

## 2015-03-17 DIAGNOSIS — Z6821 Body mass index (BMI) 21.0-21.9, adult: Secondary | ICD-10-CM | POA: Diagnosis not present

## 2015-03-17 DIAGNOSIS — Z934 Other artificial openings of gastrointestinal tract status: Secondary | ICD-10-CM | POA: Diagnosis not present

## 2015-03-17 DIAGNOSIS — K117 Disturbances of salivary secretion: Secondary | ICD-10-CM | POA: Diagnosis not present

## 2015-03-17 DIAGNOSIS — E784 Other hyperlipidemia: Secondary | ICD-10-CM | POA: Diagnosis not present

## 2015-03-17 DIAGNOSIS — Z0000 Encounter for general adult medical examination without abnormal findings: Secondary | ICD-10-CM | POA: Diagnosis not present

## 2015-03-17 DIAGNOSIS — Z1389 Encounter for screening for other disorder: Secondary | ICD-10-CM | POA: Diagnosis not present

## 2015-03-17 DIAGNOSIS — Z23 Encounter for immunization: Secondary | ICD-10-CM | POA: Diagnosis not present

## 2015-03-17 DIAGNOSIS — E113399 Type 2 diabetes mellitus with moderate nonproliferative diabetic retinopathy without macular edema, unspecified eye: Secondary | ICD-10-CM | POA: Diagnosis not present

## 2015-03-17 DIAGNOSIS — I10 Essential (primary) hypertension: Secondary | ICD-10-CM | POA: Diagnosis not present

## 2015-04-07 DIAGNOSIS — H35361 Drusen (degenerative) of macula, right eye: Secondary | ICD-10-CM | POA: Diagnosis not present

## 2015-04-07 DIAGNOSIS — H35043 Retinal micro-aneurysms, unspecified, bilateral: Secondary | ICD-10-CM | POA: Diagnosis not present

## 2015-04-07 DIAGNOSIS — H353131 Nonexudative age-related macular degeneration, bilateral, early dry stage: Secondary | ICD-10-CM | POA: Diagnosis not present

## 2015-04-07 DIAGNOSIS — E113293 Type 2 diabetes mellitus with mild nonproliferative diabetic retinopathy without macular edema, bilateral: Secondary | ICD-10-CM | POA: Diagnosis not present

## 2015-04-13 MED FILL — FLUORISHIELD 1.1% GEL: 1.1 % | 60 days supply | Qty: 228 | Fill #6

## 2015-06-07 MED FILL — FLUORISHIELD 1.1% GEL: 1.1 % | 60 days supply | Qty: 228 | Fill #0

## 2015-08-04 MED FILL — FLUORISHIELD 1.1% GEL: 1.1 % | 60 days supply | Qty: 228 | Fill #1

## 2015-10-12 MED FILL — FLUORISHIELD 1.1% GEL: 1.1 % | 60 days supply | Qty: 228 | Fill #2

## 2015-10-21 IMAGING — PT NM PET TUM IMG INITIAL (PI) SKULL BASE T - THIGH
1 of 7 series · 2 of 25 positions shown · non-contrast
Comparison: No comparisons

CLINICAL DATA: Initial treatment strategy for base of tongue
carcinoma.. KEREc6x stage Bergher squamous cell carcinoma of the base of
tongue, no smoking history

EXAM:
NUCLEAR MEDICINE PET SKULL BASE TO THIGH
TECHNIQUE: 8.3 mCi F-18 FDG was injected intravenously. Full-ring PET imaging
was performed from the skull base to thigh after the radiotracer. CT
data was obtained and used for attenuation correction and anatomic
localization.
FASTING BLOOD GLUCOSE:  Value: 96 mg/dl

[Series 4: ct hn_sk_th 5.0 b31f · axial · 5.0mm · 0.98mm/px · z∈[-1304,-392]mm · 2 of 229 slices shown]
[im 1/229  brain]
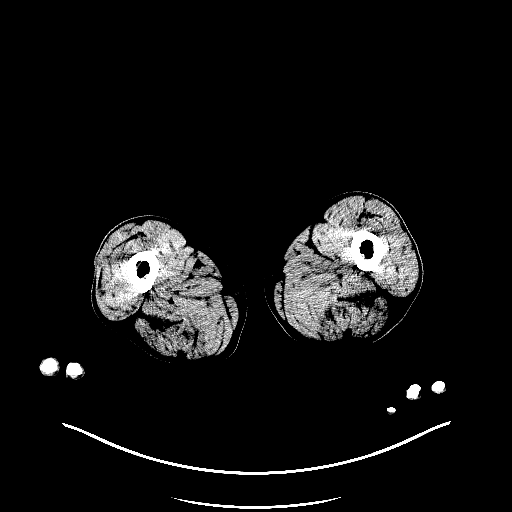
[im 229/229  brain]
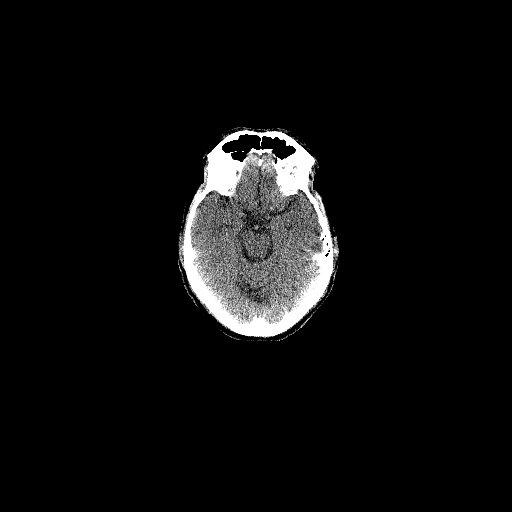

[2 of 25 positions shown; findings below may reference images not displayed]

FINDINGS: NECK

There is a large hypermetabolic mass centered centrally at the
inferior base of tongue and superiorly extending to the right base
of tongue with SUV max = 18.0.

There are bilateral small hypermetabolic level IIa lymph nodes
(image 28) with SUV max equal 4.2 on the right and 3.9 on the left.

There are smaller hypermetabolic low level II B lymph nodes beneath
the sternocleidomastoid muscles which are less than 5 mm short axis
and have mild metabolic activity (image 35).

CHEST

No evidence of supraclavicular lymphadenopathy. No hypermetabolic
mediastinal lymphadenopathy. Heart is normal. No axillary
lymphadenopathy. Review of the lung parenchyma demonstrates no
hypermetabolic or suspicious pulmonary nodules.

ABDOMEN/PELVIS

No abnormal hypermetabolic activity within the liver, pancreas,
adrenal glands, or spleen. No hypermetabolic lymph nodes in the
abdomen or pelvis.

SKELETON

No focal hypermetabolic activity to suggest skeletal metastasis.
IMPRESSION: 1. Large hypermetabolic mass centered at the base of tongue slightly
asymmetric to the right.
2. Bilateral small hypermetabolic level IIa and IIb lymph nodes
consistent local metastasis.
3. No evidence of more distant nodal or metastatic disease.

## 2015-10-27 IMAGING — CR DG CHEST 1V PORT
2 series · 2 of 2 positions shown · non-contrast
Comparison: DG CHEST 2 VIEW dated 02/05/2012

CLINICAL DATA: Postop evaluation status post porta catheter
insertion

EXAM:
PORTABLE CHEST - 1 VIEW

[AP (1 of 2)]
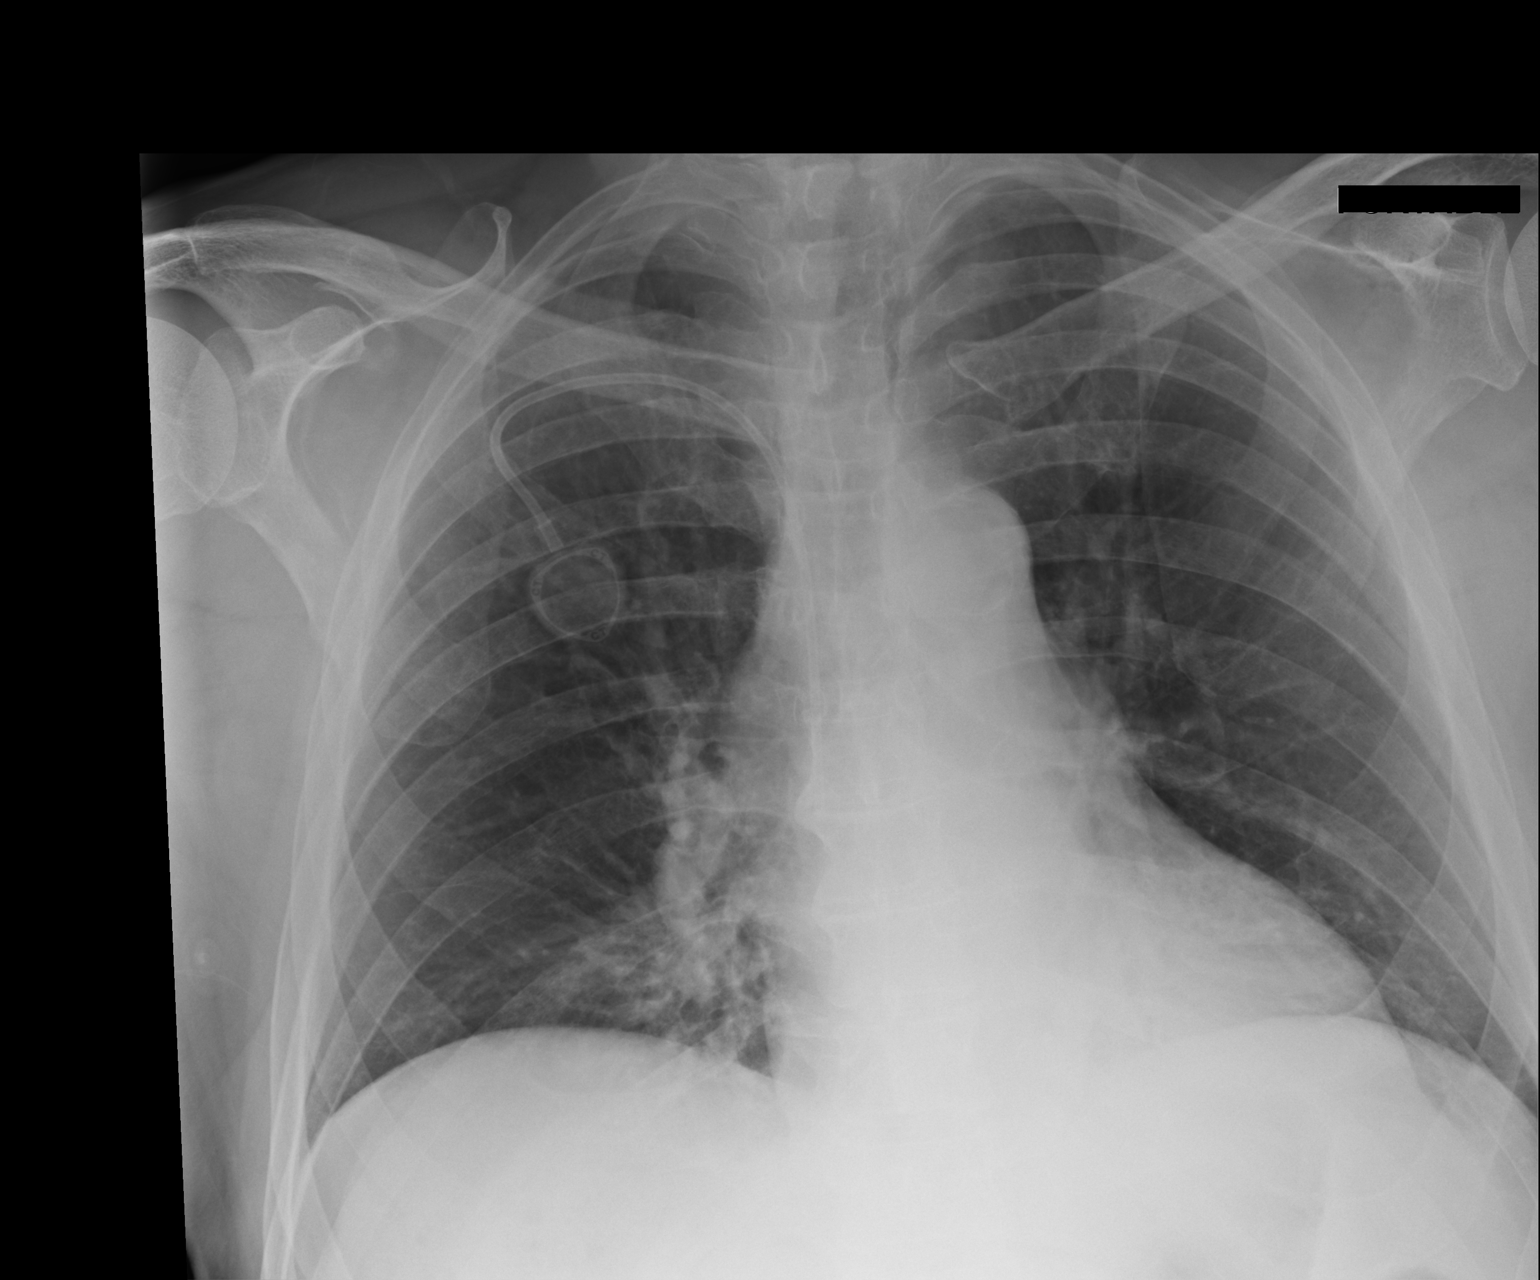

[AP (2 of 2)]
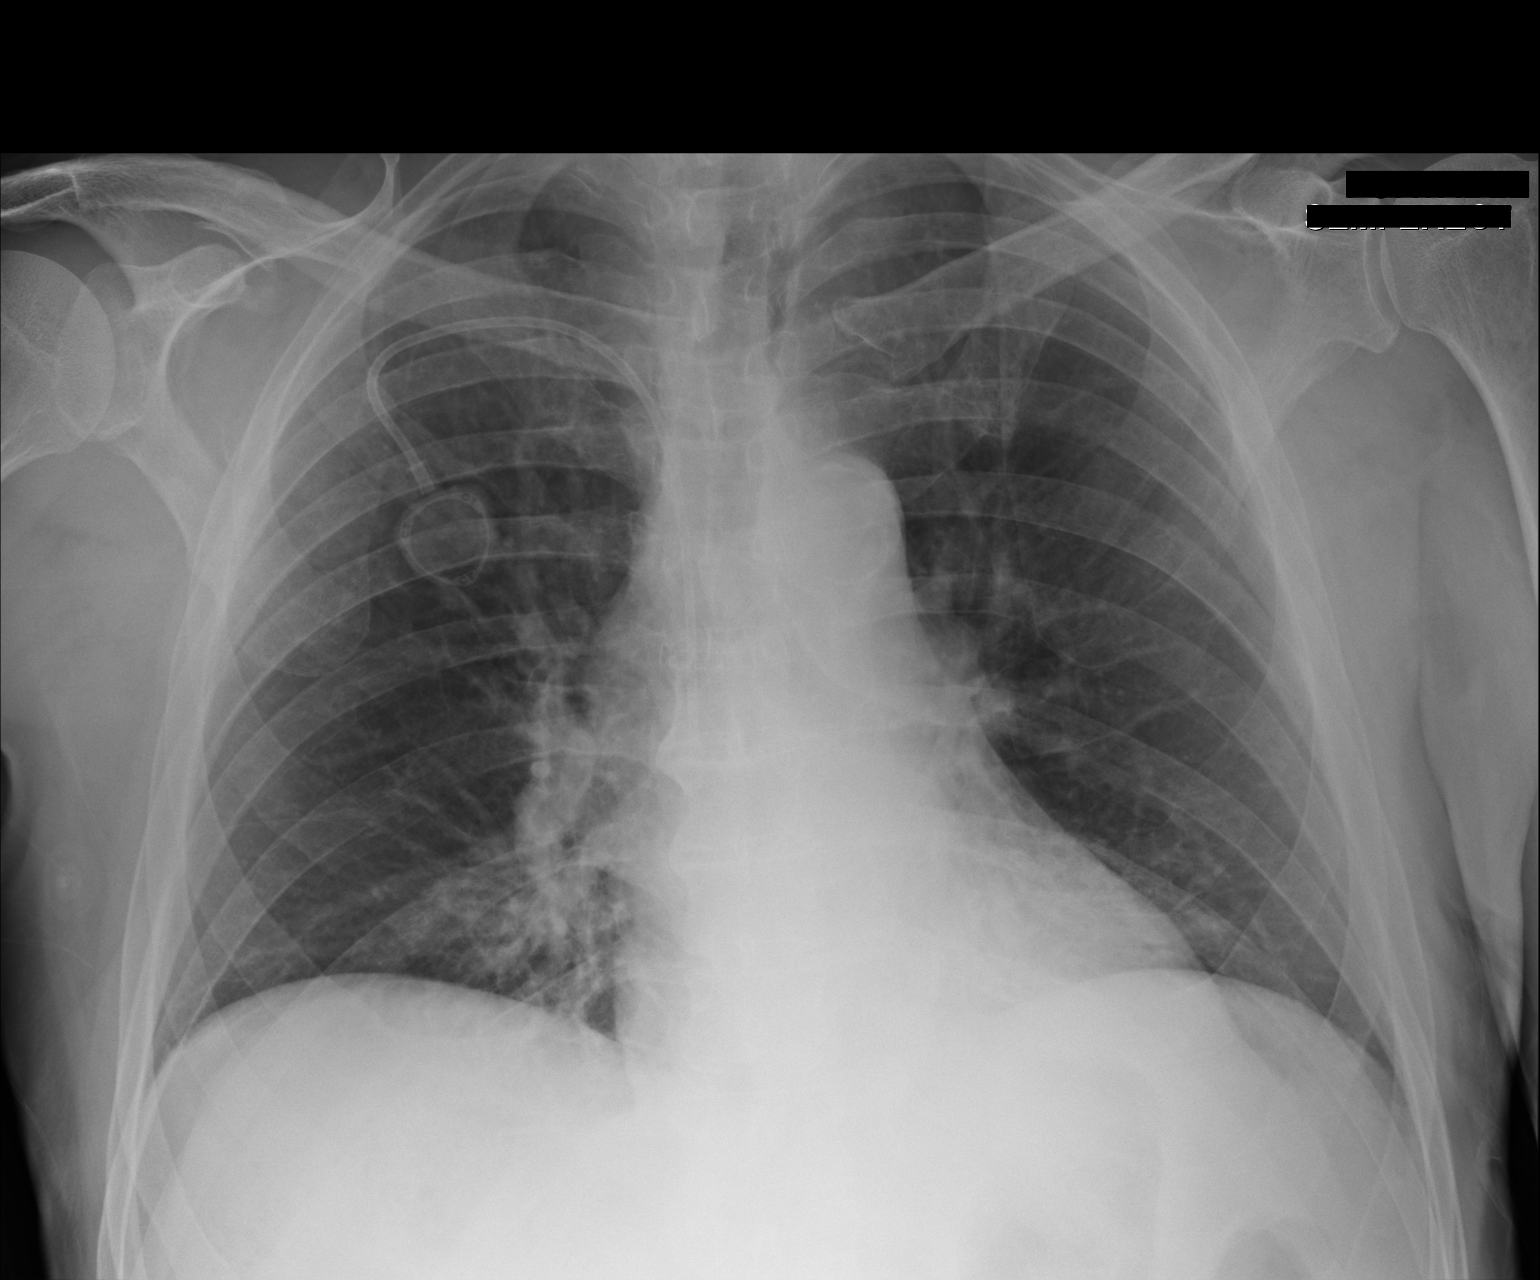

[2 of 2 positions shown; findings below may reference images not displayed]

FINDINGS: Low lung volumes. Right-sided porta catheter is appreciated via a
subclavian approach tip projecting in the region of the distal
superior vena cava. There is no evidence of pneumothorax. Increased
density medial right lung base. The lungs otherwise clear.
Degenerative changes appreciated within the mid thoracic spine.
IMPRESSION: Likely atelectasis right lung base considering the low lung volumes.
An infiltrate cannot be excluded if clinically appropriate. No
evidence of a pneumothorax. Lungs otherwise clear. If there is
persistent clinical concern repeat evaluation with PA and lateral
views recommended.

## 2015-11-01 DIAGNOSIS — Z23 Encounter for immunization: Secondary | ICD-10-CM | POA: Diagnosis not present

## 2015-11-10 ENCOUNTER — Ambulatory Visit: Payer: Medicare Other | Admitting: Radiation Oncology

## 2015-11-10 ENCOUNTER — Ambulatory Visit: Payer: Medicare Other

## 2015-11-17 NOTE — Progress Notes (Addendum)
Radiation Oncology         ([phone]) [phone] ________________________________  Name: Brent Franklin MRN: [id number]  Date: 11/19/2015  DOB: 04-19-1931  Follow-Up Visit Note  CC: Tivis Ringer, MD  Heath Lark, MD   ICD-9-CM ICD-10-CM   1. 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  Narrative:  Brent Franklin returns today for routine follow-up.  Feels well.   Pain issues, if any: He denies Using a feeding tube?: Yes, he is instilling 5 cans of Glucerna daily. He is also instilling > 1000 ml of water daily.  Weight changes, if any:  Wt Readings from Last 3 Encounters:  11/19/15 174 lb 9.6 oz (79.2 kg)  11/11/14 165 lb 1.6 oz (74.9 kg)  05/08/14 160 lb 3.2 oz (72.7 kg)   Swallowing issues, if any: He is eating softer foods, but tells me food does not taste right. He is unable to swallow chicken, bread. He will occasionally drink a boost orally, but very slowly.   Smoking or chewing tobacco? No Using fluoride trays daily? Yes, nightly.  Last ENT visit was on: Dr. Wilburn Cornelia one year and a half ago.  Other notable issues, if any: denies   Laryngoscopy offered but declined  ALLERGIES:  has No Known Allergies.  Meds: Current Outpatient Prescriptions  Medication Sig Dispense Refill  . aspirin 81 MG tablet Take 81 mg by mouth daily.    . metFORMIN (GLUCOPHAGE) 850 MG tablet Take 850 mg by mouth 2 (two) times daily with a meal.    . Nutritional Supplements (GLUCERNA 1.5 CAL) LIQD D/C Osmolite 1.5. Begin Glucerna 1.5 via feeding tube, 1.5 cans QID with 120 ml free water before and after each bolus feeding as  tolerated. 1422 mL   . sodium fluoride (FLUORISHIELD) 1.1 % GEL dental gel Instill one drop of gel per tooth space of fluoride tray. Place over teeth for 5 minutes. Remove. Spit out excess. Repeat nightly. 120 mL prn  . glipiZIDE (GLUCOTROL XL) 5 MG 24 hr tablet Take 5 mg by mouth daily.     No current facility-administered medications for this encounter.    Facility-Administered Medications Ordered in Other Encounters  Medication Dose Route Frequency Provider Last Rate Last Dose  . topical emolient (BIAFINE) emulsion   Topical BID Eppie Gibson, MD        Physical Findings: The patient is in no acute distress. Patient is alert and oriented.  height is 6\' 2"  (1.88 m) and weight is 174 lb 9.6 oz (79.2 kg). His temperature is 98.2 F (36.8 C). His blood pressure is 148/63 (abnormal) and his pulse is 67. His oxygen saturation is 94%. .  Oropharyngeal mucosa is notable only for a superficial 0.5cm midline ulcer over the palate.  No palpable cervical or supraclavicular lymphadenopathy. Skin intact and smooth over neck. Heart: RRR no murmurs Chest: CTAB PEG tube present, clear, intact. Skin healed nicely over neck Psych: affect appropriate, pleasant. Judgment appears intact   Lab Findings: Lab Results  Component Value Date   WBC 6.5 06/17/2013   HGB 12.6 (L) 06/17/2013   HCT 39.0 06/17/2013   MCV 85.7 06/17/2013  PLT 205 06/17/2013    Lab Results  Component Value Date   TSH 2.418 05/08/2014    Radiographic Findings: none  Impression/Plan:    1) Head and Neck Cancer Status: NED  2) Nutritional Status: no issues, still using PEG due to challenges eating copious amounts   3) Risk Factors: The patient has been educated about risk factors including alcohol and tobacco abuse; they understand that avoidance of alcohol and tobacco is important to prevent recurrences as well as other cancers. 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   6) Energy: TSH is getting checked by PCP, pt reports he is up to date on this. Lab Results  Component Value Date   TSH 2.418 05/08/2014    7) Social: No active social issues to address at this time  8)   Follow-up in 12 mo; he knows to call if he has new symptoms:  He will see Mike Craze, NP of survivorship in the cancer center yearly from this point. I will ask for the visit to be on a day I can stop by to say hi to the patient.  He would rather keep an eye on the tiny ulcer in his mouth and declines followup with ENT.   _____________________________________   Eppie Gibson, MD  This document serves as a record of services personally performed by Eppie Gibson, MD. It was created on her behalf by Darcus Austin, a trained medical scribe. The creation of this record is based on the scribe's personal observations and the provider's statements to them. This document has been checked and approved by the attending provider.

## 2015-11-17 NOTE — Progress Notes (Signed)
Brent Franklin presents for follow up of radiation completed 06/20/2013 to his Base of Tongue and bilateral neck.    Pain issues, if any: He denies Using a feeding tube?: Yes, he is instilling 5 cans of Glucerna daily. He is also instilling > 1000 ml of water daily.  Weight changes, if any:  Wt Readings from Last 3 Encounters:  11/19/15 174 lb 9.6 oz (79.2 kg)  11/11/14 165 lb 1.6 oz (74.9 kg)  05/08/14 160 lb 3.2 oz (72.7 kg)   Swallowing issues, if any: He is eating softer foods, but tells me food does not taste right. He is unable to swallow chicken, bread. He will occasionally drink a boost orally, but very slowly.   Smoking or chewing tobacco? No Using fluoride trays daily? Yes, nightly.  Last ENT visit was on: Dr. Wilburn Cornelia one year and a half ago.  Other notable issues, if any:   BP (!) 148/63   Pulse 67   Temp 98.2 F (36.8 C)   Ht 6\' 2"  (1.88 m)   Wt 174 lb 9.6 oz (79.2 kg)   SpO2 94% Comment: room air  BMI 22.42 kg/m

## 2015-11-18 DIAGNOSIS — Z431 Encounter for attention to gastrostomy: Secondary | ICD-10-CM | POA: Diagnosis not present

## 2015-11-18 DIAGNOSIS — C01 Malignant neoplasm of base of tongue: Secondary | ICD-10-CM | POA: Diagnosis not present

## 2015-11-19 ENCOUNTER — Ambulatory Visit
Admission: RE | Admit: 2015-11-19 | Discharge: 2015-11-19 | Disposition: A | Discharge status: Home / Self Care | Payer: Medicare Other | Source: Ambulatory Visit | Attending: Radiation Oncology | Admitting: Radiation Oncology

## 2015-11-19 ENCOUNTER — Encounter: Payer: Self-pay | Admitting: Radiation Oncology

## 2015-11-19 ENCOUNTER — Ambulatory Visit: Admission: RE | Admit: 2015-11-19 | Payer: Medicare Other | Source: Ambulatory Visit

## 2015-11-19 DIAGNOSIS — Z08 Encounter for follow-up examination after completed treatment for malignant neoplasm: Secondary | ICD-10-CM | POA: Diagnosis not present

## 2015-11-19 DIAGNOSIS — Z85810 Personal history of malignant neoplasm of tongue: Secondary | ICD-10-CM | POA: Diagnosis not present

## 2015-11-19 DIAGNOSIS — C01 Malignant neoplasm of base of tongue: Secondary | ICD-10-CM | POA: Diagnosis not present

## 2015-11-19 DIAGNOSIS — Z923 Personal history of irradiation: Secondary | ICD-10-CM | POA: Diagnosis not present

## 2015-11-22 ENCOUNTER — Telehealth: Payer: Self-pay | Admitting: *Deleted

## 2015-11-22 NOTE — Telephone Encounter (Signed)
CALLED PATIENT TO INFORM OF APPT. WITH GRETCHEN DAWSON ON 11-20-16 @ 10:30 AM, LVM FOR A RETURN CALL

## 2015-11-26 ENCOUNTER — Ambulatory Visit: Payer: Medicare Other | Admitting: Radiation Oncology

## 2015-12-15 MED FILL — FLUORISHIELD 1.1% GEL: 1.1 % | 30 days supply | Qty: 114 | Fill #3

## 2016-01-11 DIAGNOSIS — Z431 Encounter for attention to gastrostomy: Secondary | ICD-10-CM | POA: Diagnosis not present

## 2016-01-11 MED FILL — FLUORISHIELD 1.1% GEL: 1.1 % | 60 days supply | Qty: 228 | Fill #0

## 2016-01-17 DIAGNOSIS — H524 Presbyopia: Secondary | ICD-10-CM | POA: Diagnosis not present

## 2016-01-17 DIAGNOSIS — E113293 Type 2 diabetes mellitus with mild nonproliferative diabetic retinopathy without macular edema, bilateral: Secondary | ICD-10-CM | POA: Diagnosis not present

## 2016-01-17 DIAGNOSIS — Z961 Presence of intraocular lens: Secondary | ICD-10-CM | POA: Diagnosis not present

## 2016-01-17 DIAGNOSIS — H1859 Other hereditary corneal dystrophies: Secondary | ICD-10-CM | POA: Diagnosis not present

## 2016-03-15 MED FILL — FLUORISHIELD 1.1% GEL: 1.1 % | 60 days supply | Qty: 228 | Fill #1

## 2016-03-17 DIAGNOSIS — E119 Type 2 diabetes mellitus without complications: Secondary | ICD-10-CM | POA: Diagnosis not present

## 2016-03-17 DIAGNOSIS — Z1212 Encounter for screening for malignant neoplasm of rectum: Secondary | ICD-10-CM | POA: Diagnosis not present

## 2016-03-17 DIAGNOSIS — R8299 Other abnormal findings in urine: Secondary | ICD-10-CM | POA: Diagnosis not present

## 2016-03-23 DIAGNOSIS — C01 Malignant neoplasm of base of tongue: Secondary | ICD-10-CM | POA: Diagnosis not present

## 2016-03-23 DIAGNOSIS — I10 Essential (primary) hypertension: Secondary | ICD-10-CM | POA: Diagnosis not present

## 2016-03-23 DIAGNOSIS — E784 Other hyperlipidemia: Secondary | ICD-10-CM | POA: Diagnosis not present

## 2016-03-23 DIAGNOSIS — Z0000 Encounter for general adult medical examination without abnormal findings: Secondary | ICD-10-CM | POA: Diagnosis not present

## 2016-03-23 DIAGNOSIS — Z1389 Encounter for screening for other disorder: Secondary | ICD-10-CM | POA: Diagnosis not present

## 2016-03-23 DIAGNOSIS — Z6823 Body mass index (BMI) 23.0-23.9, adult: Secondary | ICD-10-CM | POA: Diagnosis not present

## 2016-03-23 DIAGNOSIS — E119 Type 2 diabetes mellitus without complications: Secondary | ICD-10-CM | POA: Diagnosis not present

## 2016-03-23 DIAGNOSIS — Z934 Other artificial openings of gastrointestinal tract status: Secondary | ICD-10-CM | POA: Diagnosis not present

## 2016-03-23 DIAGNOSIS — E113399 Type 2 diabetes mellitus with moderate nonproliferative diabetic retinopathy without macular edema, unspecified eye: Secondary | ICD-10-CM | POA: Diagnosis not present

## 2016-04-06 ENCOUNTER — Telehealth: Payer: Self-pay | Admitting: Hematology and Oncology

## 2016-04-06 NOTE — Telephone Encounter (Signed)
PATIENT CALLED TO CX 10/8 F/U WITH NG. PER PATIENT HE WILL F/U ELSEWHERE WITH HIS OWN PROVIDER.

## 2016-05-16 MED FILL — FLUORISHIELD 1.1% GEL: 1.1 % | 60 days supply | Qty: 228 | Fill #2

## 2016-08-17 MED FILL — FLUORISHIELD 1.1% GEL: 1.1 % | 60 days supply | Qty: 228 | Fill #3

## 2016-10-17 MED FILL — FLUORISHIELD 1.1% GEL: 1.1 % | 60 days supply | Qty: 228 | Fill #4

## 2016-11-10 DIAGNOSIS — Z23 Encounter for immunization: Secondary | ICD-10-CM | POA: Diagnosis not present

## 2016-11-10 DIAGNOSIS — L570 Actinic keratosis: Secondary | ICD-10-CM | POA: Diagnosis not present

## 2016-11-10 DIAGNOSIS — C44529 Squamous cell carcinoma of skin of other part of trunk: Secondary | ICD-10-CM | POA: Diagnosis not present

## 2016-11-10 DIAGNOSIS — Z85828 Personal history of other malignant neoplasm of skin: Secondary | ICD-10-CM | POA: Diagnosis not present

## 2016-11-20 ENCOUNTER — Ambulatory Visit: Payer: Self-pay | Admitting: Hematology and Oncology

## 2016-11-20 ENCOUNTER — Encounter: Payer: Self-pay | Admitting: Adult Health

## 2016-12-21 MED FILL — FLUORISHIELD 1.1% GEL: 1.1 % | 60 days supply | Qty: 228 | Fill #0

## 2017-01-17 DIAGNOSIS — H52203 Unspecified astigmatism, bilateral: Secondary | ICD-10-CM | POA: Diagnosis not present

## 2017-01-17 DIAGNOSIS — Z961 Presence of intraocular lens: Secondary | ICD-10-CM | POA: Diagnosis not present

## 2017-01-17 DIAGNOSIS — H1789 Other corneal scars and opacities: Secondary | ICD-10-CM | POA: Diagnosis not present

## 2017-01-17 DIAGNOSIS — E113293 Type 2 diabetes mellitus with mild nonproliferative diabetic retinopathy without macular edema, bilateral: Secondary | ICD-10-CM | POA: Diagnosis not present

## 2017-01-25 ENCOUNTER — Telehealth: Payer: Self-pay | Admitting: *Deleted

## 2017-01-25 NOTE — Telephone Encounter (Signed)
Oncology Nurse Navigator Documentation  Spoke with pt in follow-up to his self-referral back to [facility] d/t suspected recurrence of base of tongue cancer.  He explained he has appt with ENT Dr. Wilburn Cornelia 12/17 and Dr. Isidore Moos 12/21.  He voiced understanding diagnostics may be needed prior to seeing Dr. Isidore Moos (eg imaging), is open to rescheduling as needed.  I provided him my phone number for further contact.  Gayleen Orem, RN, BSN, [place] Oncology Nurse [place] at [place] [phone]

## 2017-01-29 DIAGNOSIS — Z85810 Personal history of malignant neoplasm of tongue: Secondary | ICD-10-CM | POA: Diagnosis not present

## 2017-01-29 DIAGNOSIS — R4702 Dysphasia: Secondary | ICD-10-CM | POA: Diagnosis not present

## 2017-01-30 NOTE — Progress Notes (Signed)
error 

## 2017-02-02 ENCOUNTER — Other Ambulatory Visit: Payer: Self-pay | Admitting: Otolaryngology

## 2017-02-02 ENCOUNTER — Ambulatory Visit
Admission: RE | Admit: 2017-02-02 | Discharge: 2017-02-02 | Disposition: A | Discharge status: Home / Self Care | Payer: Medicare Other | Source: Ambulatory Visit | Attending: Radiation Oncology | Admitting: Radiation Oncology

## 2017-02-02 DIAGNOSIS — Z6822 Body mass index (BMI) 22.0-22.9, adult: Secondary | ICD-10-CM | POA: Insufficient documentation

## 2017-02-02 DIAGNOSIS — R1310 Dysphagia, unspecified: Secondary | ICD-10-CM | POA: Diagnosis not present

## 2017-02-02 DIAGNOSIS — Z923 Personal history of irradiation: Secondary | ICD-10-CM | POA: Insufficient documentation

## 2017-02-02 DIAGNOSIS — R490 Dysphonia: Secondary | ICD-10-CM | POA: Diagnosis not present

## 2017-02-02 DIAGNOSIS — R635 Abnormal weight gain: Secondary | ICD-10-CM | POA: Insufficient documentation

## 2017-02-02 DIAGNOSIS — C01 Malignant neoplasm of base of tongue: Secondary | ICD-10-CM | POA: Insufficient documentation

## 2017-02-02 DIAGNOSIS — Z1329 Encounter for screening for other suspected endocrine disorder: Secondary | ICD-10-CM | POA: Diagnosis not present

## 2017-02-02 DIAGNOSIS — Z7982 Long term (current) use of aspirin: Secondary | ICD-10-CM | POA: Diagnosis not present

## 2017-02-02 DIAGNOSIS — D3702 Neoplasm of uncertain behavior of tongue: Secondary | ICD-10-CM

## 2017-02-02 DIAGNOSIS — Z931 Gastrostomy status: Secondary | ICD-10-CM | POA: Insufficient documentation

## 2017-02-02 DIAGNOSIS — Z7984 Long term (current) use of oral hypoglycemic drugs: Secondary | ICD-10-CM | POA: Insufficient documentation

## 2017-02-07 NOTE — Progress Notes (Signed)
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   Pain issues, if any: No Using a feeding tube?: Yes Glucerna 5 cans daily with 120 ml free water before and after tube feedings. Weight changes, if any:  Wt Readings from Last 3 Encounters:  02/09/17 177 lb 3.2 oz (80.4 kg)  11/19/15 174 lb 9.6 oz (79.2 kg)  11/11/14 165 lb 1.6 oz (74.9 kg)   Swallowing issues, if any: Yes having more frequent aspiration while eating soft foods. Smoking or chewing tobacco? No Using fluoride trays daily? Yes nightly and fluroshield gel Last ENT visit was on: Dr. Wilburn Cornelia 01/29/17.  Other notable issues, if any:  He presented to Dr. Wilburn Cornelia on 01/29/17: Per notes: He reports a 6-week history of increased dysphasia, mucus secretion in the oropharynx and throat clearing. No recent respiratory tract infection, fever or cough. Flexible laryngoscopy concerning for possible recurrent disease in the epiglottis and vallecula. We will schedule CT scan of his neck with contrast to assess his disease state. He is scheduled to follow-up with Dr. Isidore Moos later this week. Based on CT findings he may be a candidate for additional workup and treatment. The patient has previously seen Dr. Nat Christen. We may consider referral to [facility] ENT Head and Neck for possible surgical evaluation and management.  CT done today (02/09/17) early in the am BP (!) 147/75 (BP Location: Left Arm, Patient Position: Sitting, Cuff Size: Normal)   Pulse 67   Temp 97.9 F (36.6 C)   Resp 18   Ht 6\' 2"  (1.88 m)   Wt 177 lb 3.2 oz (80.4 kg)   SpO2 98%   BMI 22.75 kg/m

## 2017-02-09 ENCOUNTER — Encounter: Payer: Self-pay | Admitting: Radiation Oncology

## 2017-02-09 ENCOUNTER — Ambulatory Visit
Admission: RE | Admit: 2017-02-09 | Discharge: 2017-02-09 | Disposition: A | Discharge status: Home / Self Care | Payer: Medicare Other | Source: Ambulatory Visit | Attending: Radiation Oncology | Admitting: Radiation Oncology

## 2017-02-09 ENCOUNTER — Other Ambulatory Visit: Payer: Self-pay

## 2017-02-09 ENCOUNTER — Ambulatory Visit
Admission: RE | Admit: 2017-02-09 | Discharge: 2017-02-09 | Disposition: A | Discharge status: Home / Self Care | Payer: Medicare Other | Source: Ambulatory Visit | Attending: Otolaryngology | Admitting: Otolaryngology

## 2017-02-09 VITALS — BP 147/75 | HR 67 | Temp 97.9°F | Resp 18 | Ht 74.0 in | Wt 177.2 lb

## 2017-02-09 DIAGNOSIS — C01 Malignant neoplasm of base of tongue: Secondary | ICD-10-CM

## 2017-02-09 DIAGNOSIS — Z1329 Encounter for screening for other suspected endocrine disorder: Secondary | ICD-10-CM

## 2017-02-09 DIAGNOSIS — R490 Dysphonia: Secondary | ICD-10-CM | POA: Diagnosis not present

## 2017-02-09 DIAGNOSIS — Z923 Personal history of irradiation: Secondary | ICD-10-CM | POA: Diagnosis not present

## 2017-02-09 DIAGNOSIS — Z08 Encounter for follow-up examination after completed treatment for malignant neoplasm: Secondary | ICD-10-CM | POA: Diagnosis not present

## 2017-02-09 DIAGNOSIS — R933 Abnormal findings on diagnostic imaging of other parts of digestive tract: Secondary | ICD-10-CM | POA: Diagnosis not present

## 2017-02-09 DIAGNOSIS — D3702 Neoplasm of uncertain behavior of tongue: Secondary | ICD-10-CM

## 2017-02-09 DIAGNOSIS — R635 Abnormal weight gain: Secondary | ICD-10-CM

## 2017-02-09 DIAGNOSIS — R1310 Dysphagia, unspecified: Secondary | ICD-10-CM | POA: Diagnosis not present

## 2017-02-09 DIAGNOSIS — Z85810 Personal history of malignant neoplasm of tongue: Secondary | ICD-10-CM | POA: Diagnosis not present

## 2017-02-09 MED ORDER — IOPAMIDOL (ISOVUE-300) INJECTION 61%
75.0000 mL | Freq: Once | INTRAVENOUS | Status: AC | PRN
  Administered 2017-02-09: 75 mL via INTRAVENOUS

## 2017-02-09 NOTE — Progress Notes (Signed)
Radiation Oncology         [id number]) [phone] ________________________________  Name: Brent Franklin MRN: [id number]  Date: 02/09/2017  DOB: [birth year]  Follow-Up Visit Note  CC: Prince Solian, MD  Heath Lark, MD  Diagnosis and Prior Radiotherapy:       ICD-10-CM   1. Malignant neoplasm of base of tongue ([place]) C01 NM PET Image Restag (PS) Skull Base To Thigh    Ambulatory referral to ENT    TSH  2. Screening for hypothyroidism Z13.29 TSH  3. 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 He has had worsening dysphagia and hoarseness over the last two months. Laryngoscopy concerning to Dr Wilburn Cornelia. He therefore had a CT scan of his neck done earlier today to assess for recurrent disease. This showed abnormal mucosal and submucosal thickening at the base of the tongue, the vallecular and the epiglottis, worrisome for tumor recurrence in this location. No evidence of lymphadenopathy. I have personally reviewed his imaging with him.      Pain issues, if any: He denies.  Using a feeding tube?: Yes, Glucerna 5 cans daily with 120 ml free water before and after tube feedings  Weight changes, if any: Wt Readings from Last 3 Encounters:  02/09/17 177 lb 3.2 oz (80.4 kg)  11/19/15 174 lb 9.6 oz (79.2 kg)  11/11/14 165 lb 1.6 oz (74.9 kg)   Swallowing issues, if any: He reports more frequent aspiration while eating soft foods.  Smoking or chewing tobacco? No  Using fluoride trays  daily? Yes, nightly and fluroshield gel  Last ENT visit was on: 01/29/2017 with Dr. Wilburn Cornelia   Per Dr. Victorio Palm notes: He reports a 6-week history of increased dysphasia, mucus secretion in the oropharynx and throat clearing. No recent respiratory tract infection, fever or cough.Flexible laryngoscopy concerning for possible recurrent disease in the epiglottis and vallecula. We will schedule CT scan of his neck with contrast to assess his disease state. He is scheduled to follow-up with Dr. Isidore Moos. Based on CT findings he may be a candidate for additional workup and treatment. The patient has previously seen Dr. Nicolette Bang. We may consider referral to [facility] ENT Head and Neck for possible surgical evaluation and management.               ALLERGIES:  has No Known Allergies.  Meds: Current Outpatient Medications  Medication Sig Dispense Refill  . aspirin 81 MG tablet Take 81 mg by mouth daily.    Marland Kitchen glipiZIDE (GLUCOTROL XL) 5 MG 24 hr tablet Take 5 mg by mouth daily.    . metFORMIN (GLUCOPHAGE) 850 MG tablet Take 850 mg by mouth 2 (two) times daily with a meal.    . Nutritional Supplements (GLUCERNA 1.5 CAL) LIQD D/C Osmolite 1.5. Begin Glucerna 1.5 via feeding tube, 1.5 cans QID with 120 ml free water before and after each bolus feeding as tolerated. 1422 mL   . sodium fluoride (FLUORISHIELD) 1.1 % GEL dental gel  Instill one drop of gel per tooth space of fluoride tray. Place over teeth for 5 minutes. Remove. Spit out excess. Repeat nightly. 120 mL prn   No current facility-administered medications for this encounter.    Facility-Administered Medications Ordered in Other Encounters  Medication Dose Route Frequency Provider Last Rate Last Dose  . topical emolient (BIAFINE) emulsion   Topical BID Eppie Gibson, MD        Physical Findings: Wt Readings from Last 3 Encounters:  02/09/17 177 lb 3.2 oz (80.4 kg)  11/19/15 174 lb 9.6 oz (79.2 kg)  11/11/14 165 lb 1.6 oz (74.9 kg)     height is 6\' 2"  (1.88 m) and weight is 177 lb 3.2 oz (80.4 kg). His temperature is 97.9 F (36.6 C). His blood pressure is 147/75 (abnormal) and his pulse is 67. His respiration is 18 and oxygen saturation is 98%.   General: Alert and oriented, in no acute distress. Slight hot potato voice HEENT: Head is normocephalic. Oropharynx is notable for a telangiectasia in the left posterior pharyngeal wall. Otherwise unremarkable exam in oral cavity and oropharynx. Neck: Neck is notable for some very mild lymphedema in the anterior neck. No palpable adenopathy in the cervical or supraclavicular region. Heart: Regular in rate and rhythm with no murmurs, rubs, or gallops. Chest: Clear to auscultation bilaterally, with no rhonchi, wheezes, or rales. Lymphatics: see Neck Exam Psychiatric: Judgment and insight are intact. Affect is appropriate. Abd: + PEG TUBE  Lab Findings: Lab Results  Component Value Date   WBC 6.5 06/17/2013   HGB 12.6 (L) 06/17/2013   HCT 39.0 06/17/2013   MCV 85.7 06/17/2013   PLT 205 06/17/2013    Lab Results  Component Value Date   TSH 2.418 05/08/2014    Radiographic Findings: Ct Soft Tissue Neck W Contrast  Result Date: 02/09/2017 CLINICAL DATA:  History of tongue base cancer treated with radiation. Worsening dysphagia and hoarseness over the last 2 months. Assess for recurrent disease. Creatinine was obtained on site at [place] at [address]Results: Creatinine 0.9 mg/dL. EXAM: CT NECK WITH CONTRAST TECHNIQUE: Multidetector CT imaging of the neck was performed using the standard protocol following the bolus administration of intravenous contrast. CONTRAST:  17mL ISOVUE-300 IOPAMIDOL (ISOVUE-300) INJECTION 61% COMPARISON:  06/22/2014.  10/30/2013. FINDINGS: Pharynx and larynx: Increased tissue thickening and enhancement at the base of the tongue, the vallecula an the epiglottis not seen on the previous study. This is worrisome for recurrent tumor in  this location. This crosses the midline from right to left. Because of the superficial spreading nature, this cannot be precisely measured, but the area is estimated at about 2.8 cm in diameter. Salivary glands: Submandibular atrophy probably secondary to radiation. No parotid lesion. Thyroid: Normal Lymph nodes: No enlarged or low-density nodes on either side of the neck. Vascular: Normal Limited intracranial: Normal Visualized orbits: Not included Mastoids and visualized paranasal sinuses: Clear Skeleton: Chronic cervical spondylosis. Old partial compression fracture at T5. Upper chest: Pleural and parenchymal scarring at the apices. Other: None IMPRESSION: Abnormal mucosal and submucosal thickening at the base of the tongue, the vallecula an the epiglottis worrisome for tumor recurrence in this location. No evidence of lymphadenopathy. Consider direct inspection with biopsy and/or PET scan. NIRADS 3 Electronically Signed   By: Nelson Chimes M.D.   On: 02/09/2017 10:09    Impression/Plan:    1) Head and Neck Cancer Status: concern for recurrence locally.  I would like to obtain a PET scan.  I will request to electronically ship this imaging to [facility]. He will be referred there for consultation to be considered for TORS if recurrence is biopsy-proven.   2) Nutritional Status: Stable. He continues to primarily use his PEG tube for nutrition due to dysphagia.  3) Swallowing: He is having more frequent issues with eating food. Possibly from recurrence  4) Dental: Encouraged to continue regular followup with dentistry, and dental hygiene including fluoride rinses. He is currently using these nightly and fluoroshield gel.  5) Thyroid function - energy is good,   check TSH before PET Lab Results  Component Value Date   TSH 2.418 05/08/2014   6) Follow-up prn per patient's wishes - for now will hold a 59mo f/u slot. The patient was encouraged to call with any issues or questions before then. I will ask Gayleen Orem, RN, our Head and Neck Oncology Navigator to navigate from afar and we can resume f/u here PRN after presumed surgery.  I spent 40 minutes face to face with the patient and more than 50% of that time was spent in counseling and/or coordination of care. _____________________________________   Eppie Gibson, MD  This document serves as a record of services personally performed by Eppie Gibson, MD. It was created on her behalf by Rae Lips, a trained medical scribe. The creation of this record is based on the scribe's personal observations and the provider's statements to them. This document has been checked and approved by the attending provider.

## 2017-02-12 ENCOUNTER — Telehealth: Payer: Self-pay | Admitting: *Deleted

## 2017-02-12 NOTE — Telephone Encounter (Signed)
Oncology Nurse Navigator Documentation  Sent SECURE e-mail to [facility] Oncology Nurse Navigator Frans[place] Setters re patient's pending referral to Dr. Nicolette Bang:  "Seen by Dr. Isidore Moos 12/28 to discuss results of 12/28 CT Neck which suggested recurrence of his BOT (T2N2cM0, treated with RT 3/25-5/13/15 and weekly Cetuximab 3/25-4/29/15.  Referring to Dr. Nicolette Bang for consultation to consider TORS.  Dr. Isidore Moos has ordered PET (date pending).  I will notify you of date so appt can be arranged with Dr. Nicolette Bang s/p scan.  I will arrange for CT and PET scans to be pushed into [place]."  Gayleen Orem, RN, BSN, [place] at [place] [phone]

## 2017-02-14 ENCOUNTER — Telehealth: Payer: Self-pay | Admitting: *Deleted

## 2017-02-14 NOTE — Telephone Encounter (Signed)
CALLED PATIENT TO INFORM OF PET SCAN ON 02-21-17- ARRIVAL TIME- 7:30 AM, PT. TO BE NPO- AFTER MIDNIGHT AND HIS LAB ON 02-21-17 @ 9:30 AM AND HIS APPT. WITH DR. Nicolette Bang ON 02-28-17 - ARRIVAL TIME - 10:15 AM, SPOKE WITH PATIENT AND HE IS AWARE OF THESE APPTS

## 2017-02-19 ENCOUNTER — Other Ambulatory Visit: Payer: Self-pay

## 2017-02-19 DIAGNOSIS — E118 Type 2 diabetes mellitus with unspecified complications: Secondary | ICD-10-CM

## 2017-02-21 ENCOUNTER — Ambulatory Visit (HOSPITAL_COMMUNITY)
Admission: RE | Admit: 2017-02-21 | Discharge: 2017-02-21 | Disposition: A | Discharge status: Home / Self Care | Payer: Medicare Other | Source: Ambulatory Visit | Attending: Radiation Oncology | Admitting: Radiation Oncology

## 2017-02-21 ENCOUNTER — Other Ambulatory Visit: Payer: Self-pay | Admitting: Radiation Oncology

## 2017-02-21 ENCOUNTER — Other Ambulatory Visit: Payer: Self-pay

## 2017-02-21 ENCOUNTER — Ambulatory Visit
Admission: RE | Admit: 2017-02-21 | Discharge: 2017-02-21 | Disposition: A | Discharge status: Home / Self Care | Payer: Medicare Other | Source: Ambulatory Visit | Attending: Radiation Oncology | Admitting: Radiation Oncology

## 2017-02-21 ENCOUNTER — Telehealth: Payer: Self-pay | Admitting: Radiation Oncology

## 2017-02-21 ENCOUNTER — Encounter: Payer: Self-pay | Admitting: Radiation Oncology

## 2017-02-21 DIAGNOSIS — C01 Malignant neoplasm of base of tongue: Secondary | ICD-10-CM

## 2017-02-21 DIAGNOSIS — I2510 Atherosclerotic heart disease of native coronary artery without angina pectoris: Secondary | ICD-10-CM | POA: Insufficient documentation

## 2017-02-21 DIAGNOSIS — K8020 Calculus of gallbladder without cholecystitis without obstruction: Secondary | ICD-10-CM | POA: Insufficient documentation

## 2017-02-21 DIAGNOSIS — C029 Malignant neoplasm of tongue, unspecified: Secondary | ICD-10-CM | POA: Diagnosis not present

## 2017-02-21 DIAGNOSIS — Z1329 Encounter for screening for other suspected endocrine disorder: Secondary | ICD-10-CM

## 2017-02-21 DIAGNOSIS — E079 Disorder of thyroid, unspecified: Secondary | ICD-10-CM

## 2017-02-21 DIAGNOSIS — N2889 Other specified disorders of kidney and ureter: Secondary | ICD-10-CM | POA: Diagnosis not present

## 2017-02-21 DIAGNOSIS — R635 Abnormal weight gain: Secondary | ICD-10-CM

## 2017-02-21 DIAGNOSIS — Z923 Personal history of irradiation: Secondary | ICD-10-CM | POA: Diagnosis not present

## 2017-02-21 DIAGNOSIS — R490 Dysphonia: Secondary | ICD-10-CM | POA: Diagnosis not present

## 2017-02-21 DIAGNOSIS — Z931 Gastrostomy status: Secondary | ICD-10-CM | POA: Diagnosis not present

## 2017-02-21 DIAGNOSIS — R7989 Other specified abnormal findings of blood chemistry: Secondary | ICD-10-CM

## 2017-02-21 DIAGNOSIS — K148 Other diseases of tongue: Secondary | ICD-10-CM | POA: Insufficient documentation

## 2017-02-21 DIAGNOSIS — Z7984 Long term (current) use of oral hypoglycemic drugs: Secondary | ICD-10-CM | POA: Diagnosis not present

## 2017-02-21 DIAGNOSIS — Z7982 Long term (current) use of aspirin: Secondary | ICD-10-CM | POA: Diagnosis not present

## 2017-02-21 DIAGNOSIS — Z6822 Body mass index (BMI) 22.0-22.9, adult: Secondary | ICD-10-CM | POA: Diagnosis not present

## 2017-02-21 DIAGNOSIS — E118 Type 2 diabetes mellitus with unspecified complications: Secondary | ICD-10-CM

## 2017-02-21 DIAGNOSIS — R1310 Dysphagia, unspecified: Secondary | ICD-10-CM | POA: Diagnosis not present

## 2017-02-21 DIAGNOSIS — I700 Atherosclerosis of aorta: Secondary | ICD-10-CM | POA: Insufficient documentation

## 2017-02-21 LAB — T4, FREE: Free T4: 0.85 ng/dL (ref 0.61–1.12)

## 2017-02-21 LAB — HEMOGLOBIN A1C
Hgb A1c MFr Bld: 7.1 % — ABNORMAL HIGH (ref 4.8–5.6)
Mean Plasma Glucose: 157.07 mg/dL

## 2017-02-21 LAB — GLUCOSE, CAPILLARY: Glucose-Capillary: 152 mg/dL — ABNORMAL HIGH (ref 65–99)

## 2017-02-21 LAB — TSH: TSH: 4.244 u[IU]/mL — ABNORMAL HIGH (ref 0.320–4.118)

## 2017-02-21 MED ORDER — FLUDEOXYGLUCOSE F - 18 (FDG) INJECTION
8.8000 | Freq: Once | INTRAVENOUS | Status: AC | PRN
  Administered 2017-02-21: 8.8 via INTRAVENOUS

## 2017-02-21 NOTE — Telephone Encounter (Signed)
I spoke with Dr. Gaspar Bidding about his PET results.  He thanked me for the information and is glad that it appears that his disease is confined to the primary tumor site.  No signs of positive neck nodes or distant metastatic disease.  We discussed the benign appearing relatively stable renal lesion which he prefers not to act upon right now.  He looks forward to meeting otolaryngology at [facility] soon. -----------------------------------  Eppie Gibson, MD

## 2017-02-21 NOTE — Progress Notes (Signed)
Left message for pt to call back re: PET results. Stated he could call Anderson Malta, RN tomorrow if he does not reach me today. I will be back Friday. -----------------------------------  Eppie Gibson, MD

## 2017-02-27 ENCOUNTER — Telehealth: Payer: Self-pay | Admitting: *Deleted

## 2017-02-27 MED FILL — SF 1.1% GEL: 1.1 | 60 days supply | Qty: 224 | Fill #0

## 2017-02-27 NOTE — Telephone Encounter (Signed)
Oncology Nurse Navigator Documentation  Rec'd e-mail confirmation from [place] Fransico Setters pt's 12/28 CT Neck and 1/9 PET imaging rec'd and available for pt's consult with Dr. Nicolette Bang tomorrow morning.  Gayleen Orem, RN, BSN Head & Neck Oncology Nurse [place] at [place] [phone]

## 2017-02-28 DIAGNOSIS — K8020 Calculus of gallbladder without cholecystitis without obstruction: Secondary | ICD-10-CM | POA: Diagnosis not present

## 2017-02-28 DIAGNOSIS — K148 Other diseases of tongue: Secondary | ICD-10-CM | POA: Diagnosis not present

## 2017-02-28 DIAGNOSIS — J387 Other diseases of larynx: Secondary | ICD-10-CM | POA: Diagnosis not present

## 2017-02-28 DIAGNOSIS — Z08 Encounter for follow-up examination after completed treatment for malignant neoplasm: Secondary | ICD-10-CM | POA: Diagnosis not present

## 2017-02-28 DIAGNOSIS — R1310 Dysphagia, unspecified: Secondary | ICD-10-CM | POA: Diagnosis not present

## 2017-02-28 DIAGNOSIS — Z85810 Personal history of malignant neoplasm of tongue: Secondary | ICD-10-CM | POA: Diagnosis not present

## 2017-03-14 DIAGNOSIS — Z85810 Personal history of malignant neoplasm of tongue: Secondary | ICD-10-CM | POA: Diagnosis not present

## 2017-03-14 DIAGNOSIS — C01 Malignant neoplasm of base of tongue: Secondary | ICD-10-CM | POA: Diagnosis not present

## 2017-03-14 DIAGNOSIS — Z7984 Long term (current) use of oral hypoglycemic drugs: Secondary | ICD-10-CM | POA: Diagnosis not present

## 2017-03-14 DIAGNOSIS — I2510 Atherosclerotic heart disease of native coronary artery without angina pectoris: Secondary | ICD-10-CM | POA: Diagnosis not present

## 2017-03-14 DIAGNOSIS — N289 Disorder of kidney and ureter, unspecified: Secondary | ICD-10-CM | POA: Diagnosis not present

## 2017-03-14 DIAGNOSIS — E039 Hypothyroidism, unspecified: Secondary | ICD-10-CM | POA: Diagnosis not present

## 2017-03-14 DIAGNOSIS — R05 Cough: Secondary | ICD-10-CM | POA: Diagnosis not present

## 2017-03-14 DIAGNOSIS — R1310 Dysphagia, unspecified: Secondary | ICD-10-CM | POA: Diagnosis not present

## 2017-03-14 DIAGNOSIS — E119 Type 2 diabetes mellitus without complications: Secondary | ICD-10-CM | POA: Diagnosis not present

## 2017-03-14 DIAGNOSIS — C109 Malignant neoplasm of oropharynx, unspecified: Secondary | ICD-10-CM | POA: Diagnosis not present

## 2017-03-14 DIAGNOSIS — Z8673 Personal history of transient ischemic attack (TIA), and cerebral infarction without residual deficits: Secondary | ICD-10-CM | POA: Diagnosis not present

## 2017-03-14 DIAGNOSIS — Z01818 Encounter for other preprocedural examination: Secondary | ICD-10-CM | POA: Diagnosis not present

## 2017-03-14 DIAGNOSIS — I63549 Cerebral infarction due to unspecified occlusion or stenosis of unspecified cerebellar artery: Secondary | ICD-10-CM | POA: Diagnosis not present

## 2017-03-23 DIAGNOSIS — Z923 Personal history of irradiation: Secondary | ICD-10-CM | POA: Diagnosis not present

## 2017-03-23 DIAGNOSIS — R1310 Dysphagia, unspecified: Secondary | ICD-10-CM | POA: Diagnosis not present

## 2017-03-23 DIAGNOSIS — N289 Disorder of kidney and ureter, unspecified: Secondary | ICD-10-CM | POA: Diagnosis not present

## 2017-03-23 DIAGNOSIS — R05 Cough: Secondary | ICD-10-CM | POA: Diagnosis not present

## 2017-03-23 DIAGNOSIS — I2510 Atherosclerotic heart disease of native coronary artery without angina pectoris: Secondary | ICD-10-CM | POA: Diagnosis not present

## 2017-03-23 DIAGNOSIS — Z85810 Personal history of malignant neoplasm of tongue: Secondary | ICD-10-CM | POA: Diagnosis not present

## 2017-03-23 DIAGNOSIS — C100 Malignant neoplasm of vallecula: Secondary | ICD-10-CM | POA: Diagnosis not present

## 2017-03-23 DIAGNOSIS — C01 Malignant neoplasm of base of tongue: Secondary | ICD-10-CM | POA: Diagnosis not present

## 2017-03-23 DIAGNOSIS — Z9221 Personal history of antineoplastic chemotherapy: Secondary | ICD-10-CM | POA: Diagnosis not present

## 2017-04-04 ENCOUNTER — Encounter: Payer: Self-pay | Admitting: *Deleted

## 2017-04-04 DIAGNOSIS — C01 Malignant neoplasm of base of tongue: Secondary | ICD-10-CM | POA: Diagnosis not present

## 2017-04-04 DIAGNOSIS — R1310 Dysphagia, unspecified: Secondary | ICD-10-CM | POA: Diagnosis not present

## 2017-04-04 DIAGNOSIS — Z931 Gastrostomy status: Secondary | ICD-10-CM | POA: Diagnosis not present

## 2017-04-04 NOTE — Progress Notes (Signed)
On 04-04-17 fax medical records to [facility]

## 2017-04-05 ENCOUNTER — Other Ambulatory Visit: Payer: Self-pay | Admitting: *Deleted

## 2017-04-05 ENCOUNTER — Telehealth: Payer: Self-pay | Admitting: Hematology and Oncology

## 2017-04-05 ENCOUNTER — Telehealth: Payer: Self-pay | Admitting: *Deleted

## 2017-04-05 ENCOUNTER — Encounter: Payer: Self-pay | Admitting: Radiation Oncology

## 2017-04-05 ENCOUNTER — Encounter: Payer: Self-pay | Admitting: Hematology and Oncology

## 2017-04-05 DIAGNOSIS — C01 Malignant neoplasm of base of tongue: Secondary | ICD-10-CM

## 2017-04-05 NOTE — Telephone Encounter (Signed)
Appt has been scheduled for the pt to see Dr. Lebron Conners on 3/6 at 240pm. Letter mailed.

## 2017-04-05 NOTE — Telephone Encounter (Signed)
Oncology Nurse Navigator Documentation  Spoke with Dr. Gaspar Bidding, informed him of 3/1 12:30 NE, 1:00 S[place], 2:00 CT SIM appts.  He voiced understanding appt with Medical Oncology pending.  Gayleen Orem, RN, BSN Head & Neck Oncology Nurse [place] at [place] [phone]

## 2017-04-06 ENCOUNTER — Other Ambulatory Visit: Payer: Self-pay

## 2017-04-06 ENCOUNTER — Telehealth: Payer: Self-pay | Admitting: *Deleted

## 2017-04-06 DIAGNOSIS — C01 Malignant neoplasm of base of tongue: Secondary | ICD-10-CM

## 2017-04-06 NOTE — Telephone Encounter (Signed)
CALLED PATIENT TO ASK ABOUT COMING IN FOR LAB ON 04-09-17, PATIENT AGREED TO 10 AM ON 04-09-17

## 2017-04-06 NOTE — Telephone Encounter (Signed)
Oncology Nurse Navigator Documentation  Spoke with Dr. Gaspar Bidding, informed him of appt adjustments:  2/25 1000 lab, 2/26 8:30 NE, 9:00 Squire consult, 9:15 IV Start, 10:00 CT SIM.  He voiced understanding.  Gayleen Orem, RN, BSN Head & Neck Oncology Nurse [place] at [place] [phone]

## 2017-04-06 NOTE — Progress Notes (Signed)
Has armband been applied?  yes  Does patient have an allergy to IV contrast dye?: No   Has patient ever received premedication for IV contrast dye?: N/A  Does patient take metformin?: Yes  If patient does take metformin when was the last dose: 04/09/17  Date of lab work: 04/09/17 BUN: 26 CR: 0.97 EGFR >60  IV site: right AC  Has IV site been added to flowsheet?   Yes

## 2017-04-06 NOTE — Progress Notes (Signed)
Head and Neck Cancer Location of Tumor / Histology:  03/23/17 VALLECULA, BIOPSY:  Invasive squamous cell carcinoma, p16-positive. COMMENT: Immunohistochemical stains for p40 and p16 both show strong and diffuse positive staining, consistent with the diagnosis of a p16 positive squamous cell carcinoma.  Patient presented months ago with symptoms of: On 12/28 he reports worsening dysphagia and hoarseness to Dr. Isidore Moos. He was referred to [facility] Dr. Nicolette Bang on 02/28/17   Biopsies of vallecula revealed: invasive squamous cell carcinoma, p 16 positive.   Nutrition Status Yes No Comments  Weight changes? [x]  []  A few pounds since December  Swallowing concerns? [x]  []  He has difficulty swallowing. Liquids, soft foods only.   PEG? [x]  []  He is instilling 5 cans of glucerna daily.    Referrals Yes No Comments  Social Work? []  [x]    Dentistry? []  [x]    Swallowing therapy? []  [x]    Nutrition? []  [x]    Med/Onc? []  [x]     Safety Issues Yes No Comments  Prior radiation? [x]  []  Radiation treatment dates:05/07/2013 - 06/20/2013 Site/dose:Base of tongue and bilateral neck / 66 Gy in 33 fractions (70 Gy in 35 fractions planned, but patient declined final 2 fractions due to side effects)    Pacemaker/ICD? []  [x]    Possible current pregnancy? []  [x]    Is the patient on methotrexate? []  [x]     Tobacco/Marijuana/Snuff/ETOH use: He has never smoked. He does drink alcohol   Past/Anticipated interventions by otolaryngology, if any: Dr. 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  He has worrisome findings for recurrence involving the tongue base and epiglottis I have recommended an exam in the OR with biopsy.  Should this be positive, we discussed options. He would not be a candidate for transoral resection. Surgery would be quite morbid, and cure rates for salvage surgery in  the oropharynx are on the order of 20-30%. He currently tells me he would not be interested in surgery and thus we could consider palliative therapy. Biopsies could be sent for molecular testing and might be able to guide future therapy. I'll present his case at tumor board.  We'll start with the DL/biopsy and go from there He is agreeable with this plan. All his questions were answered.  Biopsy performed 03/23/17   Past/Anticipated interventions by medical oncology, if any:  04/04/17 Dr. Cyril Loosen Hematology/ Oncology [facility] PLAN: # Local relapse of OP SqCC.  - Re-irradiation as per RadOnc. - Offered concurrent chemotherapy with definitive radiation.   # Dysphagia. Maintaining full intake food/fluids via G-tube.  # Disposition.  - Dr .Gaspar Bidding stated that he will have his HemOnc care and cancer treatment completed in [facility] and will call our office if he would like to establish HemOnc care here with our clinic. Therefore he will follow up here as needed.    No appointment in Epic for med/onc at this time.   Current Complaints / other details:   Dr. Leeanne Mannan Radiation Oncologist [facility] Assessment and Plan: SHIZUO BISKUP is a 84 y.o. 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 We reviewed rationale for radiotherapy in the management of his disease. We also discussed the logistics of treatment and the potential toxicities of re-irradiation of the head and neck including but not limited to skin irritation, fatigue, dysphagia/odynophagia, xerostomia, dysgeusia, dehydration/weight loss requiring feeding tube, fluid building  up behind the ear causing hearing issues, spinal cord damage, jaw necrosis, dental issues, soft tissue necrosis, carotid artery injury, and trismus. Due to the localized nature of his recurrence, good PS, and long duration between completion of treatment and recurrence, we  have offered repeat RT to the area. He is most interested in having this done at [facility] where his prior RT was done, so we will arrange for him to be seen there. He expresses understanding and agreement with the plan.    BP 127/70   Pulse 77   Temp 97.8 F (36.6 C)   Ht 6\' 2"  (1.88 m)   Wt 174 lb (78.9 kg)   SpO2 97% Comment: room air  BMI 22.34 kg/m    Wt Readings from Last 3 Encounters:  04/10/17 174 lb (78.9 kg)  02/09/17 177 lb 3.2 oz (80.4 kg)  11/19/15 174 lb 9.6 oz (79.2 kg)

## 2017-04-09 ENCOUNTER — Encounter: Payer: Self-pay | Admitting: *Deleted

## 2017-04-09 ENCOUNTER — Ambulatory Visit
Admission: RE | Admit: 2017-04-09 | Discharge: 2017-04-09 | Disposition: A | Discharge status: Home / Self Care | Payer: Medicare Other | Source: Ambulatory Visit | Attending: Radiation Oncology | Admitting: Radiation Oncology

## 2017-04-09 ENCOUNTER — Telehealth: Payer: Self-pay

## 2017-04-09 DIAGNOSIS — Z510 Encounter for antineoplastic radiation therapy: Secondary | ICD-10-CM | POA: Insufficient documentation

## 2017-04-09 DIAGNOSIS — C01 Malignant neoplasm of base of tongue: Secondary | ICD-10-CM | POA: Diagnosis not present

## 2017-04-09 LAB — BUN & CREATININE (CHCC)
BUN: 26 mg/dL (ref 7–26)
CREATININE: 0.97 mg/dL (ref 0.70–1.30)

## 2017-04-09 NOTE — Telephone Encounter (Signed)
I called and spoke to Dr. Gaspar Bidding. He is aware of his CT simulation tomorrow and that he will receive IV contrast. I have asked him not to take his metformin tomorrow and he will need to come to the [facility] on Thursday 04/12/17 to have his labs rechecked. I will then call him on Thursday to inform him if he can resume his metformin as ordered. He voiced his understanding and appreciation for the phone call.

## 2017-04-09 NOTE — Progress Notes (Signed)
Oncology Nurse Navigator Documentation  Met with Dr. Yaakov Guthrie lobby, provided him information for tomorrow's 2:20 appt with Dr. Irene Limbo.  He voiced appreciation.  Gayleen Orem, RN, BSN Head & Neck Oncology Nurse [place] at [place] [phone]

## 2017-04-10 ENCOUNTER — Encounter: Payer: Self-pay | Admitting: *Deleted

## 2017-04-10 ENCOUNTER — Encounter: Payer: Self-pay | Admitting: Hematology

## 2017-04-10 ENCOUNTER — Ambulatory Visit
Admission: RE | Admit: 2017-04-10 | Discharge: 2017-04-10 | Disposition: A | Discharge status: Home / Self Care | Payer: Medicare Other | Source: Ambulatory Visit | Attending: Radiation Oncology | Admitting: Radiation Oncology

## 2017-04-10 ENCOUNTER — Telehealth: Payer: Self-pay

## 2017-04-10 ENCOUNTER — Ambulatory Visit
Admission: RE | Admit: 2017-04-10 | Discharge: 2017-04-10 | Disposition: A | Discharge status: Home / Self Care | Payer: Medicare Other | Source: Ambulatory Visit | Attending: Hematology | Admitting: Hematology

## 2017-04-10 ENCOUNTER — Inpatient Hospital Stay: Payer: Medicare Other | Attending: Hematology | Admitting: Hematology

## 2017-04-10 ENCOUNTER — Encounter: Payer: Self-pay | Admitting: Radiation Oncology

## 2017-04-10 VITALS — BP 127/70 | HR 77 | Temp 97.8°F | Ht 74.0 in | Wt 174.0 lb

## 2017-04-10 VITALS — BP 152/72 | HR 69 | Temp 98.6°F | Resp 18 | Ht 74.0 in | Wt 173.6 lb

## 2017-04-10 DIAGNOSIS — Z8673 Personal history of transient ischemic attack (TIA), and cerebral infarction without residual deficits: Secondary | ICD-10-CM | POA: Insufficient documentation

## 2017-04-10 DIAGNOSIS — C100 Malignant neoplasm of vallecula: Secondary | ICD-10-CM | POA: Insufficient documentation

## 2017-04-10 DIAGNOSIS — Z510 Encounter for antineoplastic radiation therapy: Secondary | ICD-10-CM | POA: Diagnosis not present

## 2017-04-10 DIAGNOSIS — Z7984 Long term (current) use of oral hypoglycemic drugs: Secondary | ICD-10-CM | POA: Diagnosis not present

## 2017-04-10 DIAGNOSIS — R1310 Dysphagia, unspecified: Secondary | ICD-10-CM | POA: Insufficient documentation

## 2017-04-10 DIAGNOSIS — Z7982 Long term (current) use of aspirin: Secondary | ICD-10-CM | POA: Insufficient documentation

## 2017-04-10 DIAGNOSIS — Z85810 Personal history of malignant neoplasm of tongue: Secondary | ICD-10-CM | POA: Insufficient documentation

## 2017-04-10 DIAGNOSIS — Z923 Personal history of irradiation: Secondary | ICD-10-CM | POA: Diagnosis not present

## 2017-04-10 DIAGNOSIS — C01 Malignant neoplasm of base of tongue: Secondary | ICD-10-CM

## 2017-04-10 DIAGNOSIS — R1312 Dysphagia, oropharyngeal phase: Secondary | ICD-10-CM | POA: Diagnosis not present

## 2017-04-10 MED ORDER — LARYNGOSCOPY SOLUTION RAD-ONC
15.0000 mL | Freq: Once | TOPICAL | Status: AC
  Administered 2017-04-10: 15 mL via TOPICAL
  Filled 2017-04-10: qty 15

## 2017-04-10 MED ORDER — SODIUM CHLORIDE 0.9% FLUSH
10.0000 mL | Freq: Once | INTRAVENOUS | Status: AC
  Administered 2017-04-10: 10 mL via INTRAVENOUS

## 2017-04-10 NOTE — Progress Notes (Signed)
Radiation Oncology         [phone] ________________________________  Name: Brent Franklin MRN: [id number]  Date: 04/10/2017  DOB: Nov 09, 1931  Reconsult Note  Outpatient  CC: Prince Solian, MD  Olevia Bowens, MD  Diagnosis and Prior Radiotherapy:    ICD-10-CM   1. Malignant neoplasm of base of tongue ([place]) C01 Fiberoptic laryngoscopy    laryngocopy solution for Rad-Onc    [id number] stage IVa squamous cell carcinoma of the base of tongue - now with recurrence at vallecula/epiglottis  Radiation treatment dates:05/07/2013 - 06/20/2013 Site/dose:Base of tongue and bilateral neck / 66 Gy in 33 fractions (70 Gy in 35 fractions planned, but patient declined final 2 fractions due to side effects)  CHIEF COMPLAINT: Here for reconsult of his tongue cancer  Narrative:  The patient returns today for reconsult of tongue cancer treated with radiation 4 years ago. He presented for routine follow up on 02/09/17 with reports of dysphagia and hoarseness persisting for two months. He had a concerning laryngoscopy with Dr. Wilburn Cornelia and went on to CT scan of neck on prior to 12/28 follow up. This showed abnormal mucosal and submucosal thickening at the base of the tongue, the vallecular and the epiglottis, worrisome for tumor recurrence in this location. No evidence of lymphadenopathy.  We ordered a PET scan that was performed on 02/21/17 which confirmed hypermetabolism corresponding to soft tissue thickening and enhancement involving the tongue base and epiglottis suspicious for recurrent or metachronous disease. There was no evidence of cervical nodal or extra cervical metastasis.     I have personally reviewed the patient's imaging.  Since last visit with me, the patient was seen at [facility]. He was seen by Dr. Nicolette Bang. Biopsy of the vallecula revealed invasive squamous cell carcinoma p16 positive. Dr. Nicolette Bang performed a laryngoscopy and appreciated a granular lesion of the lingual  surface of the epiglottis with ulceration in the midline sulcus of the vallecula. The laryngeal surface of the epiglottis, aryepiglottic folds, hypopharynx, superglottis and glottis appeared healthy. He told the patient that he would not be a candidate for resection. Surgery would be quite morbid with cure rates of 20-30%.   The patient saw Dr. Cordelia Pen of radiation oncology and she conferred with me personally. She recommended reiradiation of the tumor with tight margins with concurrent CDDP. The patient requested treatment close to home and therefore returns to our clinic.   Med/onc appt pending here, today.  On review of systems, patient states he feels "okay" but feels that swallowing, speech, and mucus production are worsening. Endorses PEG.  Denies history of barium swallow. Reports of "terrible" coughing spells.  ALLERGIES:  has No Known Allergies.  Meds: Current Outpatient Medications  Medication Sig Dispense Refill  . aspirin 81 MG tablet Take 81 mg by mouth daily.    . metFORMIN (GLUCOPHAGE) 850 MG tablet Take 850 mg by mouth 2 (two) times daily with a meal.     No current facility-administered medications for this encounter.    Facility-Administered Medications Ordered in Other Encounters  Medication Dose Route Frequency Provider Last Rate Last Dose  . topical emolient (BIAFINE) emulsion   Topical BID Eppie Gibson, MD        Physical Findings: The patient is in no acute distress. Patient is alert and oriented. Upper throat no lesions visualized. Tongue is midline. No palpable masses in his neck. Heart and lungs are clear.   PROCEDURE NOTE: After obtaining consent and anesthetizing the nasal cavity with topical lidocaine and phenylephrine,  the flexible endoscope was introduced and passed through the nasal cavity.  The entire surface of the lingual epiglottis appears irregular with a whitish and erythematous surface consistent with tumor. The tumor appears to extend to the vallecula  and involve the vallecula. The glottic folds appear normal and symmetrically mobile. The position of the epiglottis makes it challenging to view the entire supraglottis and the scope was not passed beyond the epiglottis.    height is 6\' 2"  (1.88 m) and weight is 174 lb (78.9 kg). His temperature is 97.8 F (36.6 C). His blood pressure is 127/70 and his pulse is 77. His oxygen saturation is 97%. .      Lab Findings: Lab Results  Component Value Date   WBC 6.5 06/17/2013   HGB 12.6 (L) 06/17/2013   HCT 39.0 06/17/2013   MCV 85.7 06/17/2013   PLT 205 06/17/2013    Radiographic Findings: No results found.  Impression/Plan:   This is a very unfortunate recurrence.  Dr. Cordelia Pen and I agree that reiradiation is appropriate given morbidity of surgery and nature of his recurrence. The patient and I discussed the risks, benefits, and side effects of radiotherapy. I recommend radiotherapy and anticipate a 6 week course of reirradiation to the tumor with tight margins.  Mucositis, fatigue, and acute potentially deadly bleeding are possible.  Precautions discussed in case of acute bleeding.  Late Risks also discussed; they could include but not necessarily be limited to: long term worsening dysphagia, aspiration, pneumonia, necrosis of tissue, ulceration in throat, nerve/spinal cord damage, deadly vessel rupture. No guarantees of treatment were given. The patient is enthusiastic about proceeding with treatment. Consent signed.  He will proceed with CT sim today. I hope to have radiation started by early next week. He prefers to be treated in the morning. I look forward to participating in the patient's care and will do my utmost to balance risk with benefit in this setting.  Goal of care is aggressive palliation.  MBSS will be ordered, as well as nutritionist referral, SLP.  I spent 45 minutes face to face with the patient and more than 50% of that time was spent in counseling and/or coordination of  care. _______   Eppie Gibson, MD  This document serves as a record of services personally performed by Eppie Gibson, MD. It was created on his behalf by Linward Natal, a trained medical scribe. The creation of this record is based on the scribe's personal observations and the provider's statements to them. This document has been checked and approved by the attending provider.

## 2017-04-10 NOTE — Progress Notes (Signed)
Head and Neck Cancer Simulation, IMRT treatment planning, and Special treatment procedure note   Outpatient  Diagnosis:    ICD-10-CM   1. Malignant neoplasm of base of tongue ([place]) C01     The patient was taken to the CT simulator and laid in the supine position on the table. An Aquaplast head and shoulder mask was custom fitted to the patient's anatomy. High-resolution CT axial imaging was obtained of the head and neck with contrast. I verified that the quality of the imaging is good for treatment planning. 1 Medically Necessary Treatment Device was fabricated and supervised by me: Aquaplast mask.   Treatment planning note I plan to treat the patient with IMRT. I plan to treat the patient's tumor with tight margins. I plan to treat to a total dose of 60 Gray in 30  fractions. Dose calculation was ordered from dosimetry.  IMRT planning Note  IMRT is medically necessary and an important modality to deliver adequate dose to the patient's at risk tissues while sparing the patient's normal structures, including the: esophagus, parotid tissue, mandible, brain stem, spinal cord, oral cavity, brachial plexus.  This justifies the use of IMRT in the patient's treatment.    Special Treatment Procedure Note: The patient received prior radiotherapy within his current fields. There will be overlap of radiation dose.  Prior regional radiotherapy increases the risk of side effects from treatment. I have considered this in the treatment planning process and have aimed to minimize tissue overlap to critical tissues.  This increases the complexity of this patient's treatment and therefore this constitutes a special treatment procedure. Dose accumulation plan ordered from dosimetry.   -----------------------------------  Eppie Gibson, MD

## 2017-04-10 NOTE — Progress Notes (Signed)
Oncology Nurse Navigator Documentation  Met with Dr. Gaspar Bidding during New Patient consult with Dr. Irene Limbo. He voiced understanding: Systemic tmt not recommended at [facility] time d/t localized nature of BOT recurrence and plan for RT; and previous difficulty w/ Cetuximab. Immune therapy an option s/p RT if needed. Dr. Irene Limbo will follow to provide symptom mgt. I encouraged Dr. Gaspar Bidding to call me with questions/concerns prior to RT 3/4.   Gayleen Orem, RN, BSN Head & Neck Oncology Nurse [place] at [place] [phone]

## 2017-04-10 NOTE — Progress Notes (Signed)
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 Nicolette Bang. Radiation Oncology: Dr. Isidore Moos, [facility]. Medical Oncology: Dr. Alvy Bimler, [facility].  1) Oropharynx SqCC p16+, initial stage II (T2N2M0 by AJCC 8th edition). - 04/01/13: CT neck with cancer at base of tongue involving vallecula; b/l LAD. - 04/16/2013: [id number]: Biopsy c/w SqCC, positive PCR for HPV type 16. - 05/07/2013 - 06/25/2013: Radiation therapy. - 05/07/2013 - 06/11/2013: Concurrent weekly Cetuximab (6/7 treatments; last week held due to severe oral ulcerations). Also with G2 radiation dermatitis. - 10/30/2013: Repeat PET/CT scan show complete response to treatment; started surveillance.  LOCAL RELAPSE, relapsed stage II (rcT3N0M0, p16+ by AJCC 8th edition). - Dec 2018 p/w worsening dysphagia. - 02/21/17: PET/CT hyperavid thickening at BOT, vallecula, epiglottis; no LAD. - Biopsy vallecula 03/23/17 [id number]: SqCC, p16-positive.     HISTORY OF PRESENTING ILLNESS:   Brent Franklin is a wonderful 84 y.o. 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   In interim, he was being followed at [facility] by DO Cyril Loosen. He met with RN Liliane Channel as well during this visit.   He was 1st diagnosed with p16 +ve in 03/2013 and as noted in oncologic hx was treated with Cetuximab + RT as noted above. He notes his recurrence was in 01/2017 and the workup he had in 1-03/2017. PET/CT suggestive of T3N0M0-Stage II SCC -Bx confirmed.  He has been evaluated by ENT and determine not  to be a candidate for surgery.  Seen by Dr Isidore Moos from rad onc and has been considered to be a candidate fr re-radiation to try to control his recurrent disease.  He notes his last Hb1c was 6.6. He is on Metformin. He notes his hearing loss is independent from his diagnosis and treatment. 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 His mother passed from breast cancer in her 42s. His brother was treated for prostate cancer. Pt notes he is a non-smoker and does not drink much alcohol.   He notes that he has had chronic dysphagia since his previous Cetuximab/RT treatment and has been depending on Chronc G tube for nutrition.  On review of symptoms, pt notes increased mucus formation in his mouth/throat, he denies pain with swallowing but feels the presence and physical difficulty to swallow. He denies issues with urination or bowels. He denies testicular pain or swelling.    MEDICAL HISTORY:  Past Medical History:  Diagnosis Date  . Cataract   . Diabetes mellitus without complication ([place])   . Hyperlipidemia   . Hypertension   . Inguinal hernia   . Insomnia 05/06/2013  . Rash 05/14/2013  . S/P radiation therapy 05/07/13- 06/20/13   Base of tongue/bilat neck 66 Gy, 33 fx  . Stroke [facility]) 2001   eye  . Tongue cancer ([place]) 04/18/2013   Base of tongue  . Tuberculosis    + PPD NO TX     SURGICAL HISTORY: Past Surgical History:  Procedure Laterality Date  . EYE SURGERY  2000, 2001   cataract  . INGUINAL HERNIA REPAIR  02/13/2012  Procedure: HERNIA REPAIR INGUINAL ADULT;  Surgeon: Adin Hector, MD;  Location: [place];  Service: General;  Laterality: Right;  . INSERTION OF MESH  02/13/2012   Procedure: INSERTION OF MESH;  Surgeon: Adin Hector, MD;  Location: [place];  Service: General;  Laterality: N/A;  . LAPAROSCOPIC GASTROSTOMY N/A 05/05/2013   Procedure: LAPAROSCOPIC GASTROSTOMY;  Surgeon: Adin Hector, MD;  Location: [place];  Service: General;  Laterality:  N/A;  . PORTACATH PLACEMENT N/A 05/05/2013   Procedure: INSERTION PORT-A-CATH;  Surgeon: Adin Hector, MD;  Location: [place];  Service: General;  Laterality: N/A;    SOCIAL HISTORY: Social History   Socioeconomic History  . Marital status: Married    Spouse name: Not on file  . Number of children: 3  . Years of education: Not on file  . Highest education level: Not on file  Social Needs  . Financial resource strain: Not on file  . Food insecurity - worry: Not on file  . Food insecurity - inability: Not on file  . Transportation needs - medical: Not on file  . Transportation needs - non-medical: Not on file  Occupational History    Comment: Retired-- Internal Medicine MD  Tobacco Use  . Smoking status: Never Smoker  . Smokeless tobacco: Never Used  Substance and Sexual Activity  . Alcohol use: No    Frequency: Never    Comment: glass of wine - 4 days a week  . Drug use: No  . Sexual activity: Not on file  Other Topics Concern  . Not on file  Social History Narrative  . Not on file    FAMILY HISTORY: Family History  Problem Relation Age of Onset  . Cancer Mother 52       breast ca  . Stroke Father   . Cancer Brother 79       prostate ca  . Heart disease Brother     ALLERGIES:  has No Known Allergies.  MEDICATIONS:  Current Outpatient Medications  Medication Sig Dispense Refill  . aspirin 81 MG tablet Take 81 mg by mouth daily.    . metFORMIN (GLUCOPHAGE) 850 MG tablet Take 850 mg by mouth 2 (two) times daily with a meal.     No current facility-administered medications for this visit.    Facility-Administered Medications Ordered in Other Visits  Medication Dose Route Frequency Provider Last Rate Last Dose  . topical emolient (BIAFINE) emulsion   Topical BID Eppie Gibson, MD        REVIEW OF SYSTEMS:    .10 Point review of Systems was done is negative except as noted above.  PHYSICAL EXAMINATION: ECOG PERFORMANCE STATUS: 2 - Symptomatic, <50%  confined to bed  . Vitals:   04/10/17 1440  BP: (!) 152/72  Pulse: 69  Resp: 18  Temp: 98.6 F (37 C)  SpO2: 100%   Filed Weights   04/10/17 1440  Weight: 173 lb 9.6 oz (78.7 kg)   .Body mass index is 22.29 kg/m.  GENERAL:alert, in no acute distress and comfortable SKIN: skin changes over neck from previous Radiation. EYES: conjunctiva are pink and non-injected, sclera anicteric OROPHARYNX: MMM, no exudates, NECK: supple, no JVD LYMPH:  no palpable lymphadenopathy in the cervical, axillary or inguinal regions LUNGS: clear to auscultation b/l with normal respiratory effort HEART: regular rate & rhythm ABDOMEN:  normoactive bowel sounds , non tender, not distended. Extremity: no pedal edema PSYCH: alert & oriented x 3 with fluent speech NEURO: no  focal motor/sensory deficits  LABORATORY DATA:  I have reviewed the data as listed  . CBC Latest Ref Rng & Units 06/17/2013 06/09/2013 06/03/2013  WBC 4.0 - 10.3 10e3/uL 6.5 7.1 4.2  Hemoglobin 13.0 - 17.1 g/dL 12.6(L) 12.2(L) 11.6(L)  Hematocrit 38.4 - 49.9 % 39.0 37.0(L) 34.7(L)  Platelets 140 - 400 10e3/uL 205 196 171    . CMP Latest Ref Rng & Units 04/12/2017 04/09/2017 06/17/2013  Glucose 70 - 140 mg/dl - - 179(H)  BUN 7 - 26 mg/dL 26 26 25.5  Creatinine 0.70 - 1.30 mg/dL 0.93 0.97 1.0  Sodium 136 - 145 mEq/L - - 141  Potassium 3.5 - 5.1 mEq/L - - 4.9  Chloride 96 - 112 mEq/L - - -  CO2 22 - 29 mEq/L - - 28  Calcium 8.4 - 10.4 mg/dL - - 9.1  Total Protein 6.4 - 8.3 g/dL - - 6.2(L)  Total Bilirubin 0.20 - 1.20 mg/dL - - 0.57  Alkaline Phos 40 - 150 U/L - - 135  AST 5 - 34 U/L - - 30  ALT 0 - 55 U/L - - 48   PATHOLOGY  FINAL PATHOLOGIC DIAGNOSIS 03/28/17 MICROSCOPIC EXAMINATION AND DIAGNOSIS: VALLECULA, BIOPSY:  Invasive squamous cell carcinoma, p16-positive. COMMENT: Immunohistochemical stains for p40 and p16 both show strong and diffuse positive staining, consistent with the diagnosis of a p16 positive  squamous cell carcinoma. The immunohistochemical controls worked appropriately. Gross Description Received labeled "vallecula biopsy" are multiple pale tan soft tissue fragments aggregating 2.4 x 2 x 0.4 cm, entirely submitted in A1.   FINAL PATHOLOGIC DIAGNOSIS 04/15/13 MICROSCOPIC EXAMINATION AND DIAGNOSIS BASE OF TONGUE, BIOPSY: Squamous cell carcinoma, moderately differentiated. Gross Description Received labeled "tongue base" are multiple tan-white 0.3 cm in greatest dimension.The specimen is filtered and entirely submitted in A1.  RADIOGRAPHIC STUDIES: I have personally reviewed the radiological images as listed and agreed with the findings in the report. No results found.  ASSESSMENT & PLAN:  Brent Franklin is a 84 y.o. Caucasian male with    1. Oropharynx Squamous Cell Carcinoma of the tongue base - relapsed stage II in 01/2017 LOCAL RELAPSE, relapsed stage II (rcT3N0M0, p16+ by AJCC 8th edition). - Dec 2018 p/w worsening dysphagia. - 02/21/17: PET/CT hyperavid thickening at BOT, vallecula, epiglottis; no LAD. - Biopsy vallecula 03/23/17 [id number]: SqCC, p16-positive.   Original Diagnosis: - 04/01/13: CT neck with cancer at base of tongue involving vallecula; b/l LAD. - 04/16/2013: [id number]: Biopsy c/w SqCC, positive PCR for HPV type 16. - 05/07/2013 - 06/25/2013: Radiation therapy. - 05/07/2013 - 06/11/2013: Concurrent weekly cetuximab (6/7 treatments; last week held due to severe oral ulcerations). Also with G2 radiation dermatitis. - 10/30/2013: Repeat PET/CT scan show complete response to treatment; started surveillance.  He still has a feeding Tube placed and has limited by mouth eating due to difficulty swallowing. 95% nutrition by tube feeding. Currently has 23 french feeding tube placed.    PLAN: -I reviewed his initial treatment, his relapse and recent pathology results and imaging with the pt. He has had a localized relapse of his Oropharynx Squamous Cell Carcinoma.    -He notes he poorly tolerated initial radiation treatment + Cetuximab with skin toxicity and Dysphasia. -Current treatment options include localized radiation with Dr. Isidore Moos. I discussed adding a sensitizer such as chemotherapy to his radiation may worsen his toleration and toxicity to radiation. Surgical resection was not recommended for him. He plans to start radiation on 04/16/17.  -Post radiation, we will follow him and  give supportive care until his localized relapse is no longer controlled. If needed he has the option of immunotherapy or chemotherapy to better control the disease.  -I provided him reading material on the treatment guidelines for his cancer and both of his biopsy reports.   -Will follow up with Nutritionist Pamala Hurry soon  2) . Patient Active Problem List   Diagnosis Date Noted  . S/P percutaneous endoscopic gastrostomy (PEG) tube placement ([place]) 12/15/2013  . Dysphagia 09/15/2013  . Malnutrition ([place]) 07/14/2013  . Hypomagnesemia 06/03/2013  . Type II or unspecified type diabetes mellitus without mention of complication, not stated as uncontrolled 06/03/2013  . Rash 05/14/2013  . Insomnia 05/06/2013  . Malignant neoplasm of base of tongue ([place]) 04/18/2013  . HTN (hypertension) 03/03/2013  . Right inguinal hernia 01/31/2012   -continue f/u with PCP for other chronic medical issues  RTC with Dr Irene Limbo in 3 weeks with labs   All of the patients questions were answered with apparent satisfaction. The patient knows to call the clinic with any problems, questions or concerns.  I spent 45 minutes counseling the patient face to face. The total time spent in the appointment was 60 minutes and more than 50% was on counseling and direct patient cares.    Sullivan Lone MD MS AAHIVMS [facility] Hematology/Oncology Physician [facility]  (Office):       [phone] (Work cell):  [phone] (Fax):           [phone]  04/10/2017 3:36 PM  This document serves  as a record of services personally performed by Sullivan Lone, MD. It was created on his behalf by Joslyn Devon, a trained medical scribe. The creation of this record is based on the scribe's personal observations and the provider's statements to them.    .I have reviewed the above documentation for accuracy and completeness, and I agree with the above. Brunetta Genera MD MS

## 2017-04-10 NOTE — Telephone Encounter (Signed)
Call and left a detailed message of up coming appointment. Per 2/26 los, also mailed a letter

## 2017-04-10 NOTE — Progress Notes (Signed)
Oncology Nurse Navigator Documentation  Met with Dr. Gaspar Bidding during reconsult with Dr. Isidore Moos. He returns for discussion re tmt for recurrent BOT cancer for which he rec'd RT and Cetuximab 04/2013. He voiced understanding of/agreed to re-irradiation, 30 fxt.  He proceeded to [facility] following consult after which he returned to Nursing for laryngoscopy.  Gayleen Orem, RN, BSN Head & Neck Oncology Nurse [place] at [place] [phone]

## 2017-04-11 ENCOUNTER — Encounter: Payer: Self-pay | Admitting: Radiation Oncology

## 2017-04-11 ENCOUNTER — Other Ambulatory Visit: Payer: Self-pay | Admitting: Radiation Oncology

## 2017-04-11 DIAGNOSIS — C01 Malignant neoplasm of base of tongue: Secondary | ICD-10-CM | POA: Diagnosis not present

## 2017-04-11 DIAGNOSIS — C100 Malignant neoplasm of vallecula: Secondary | ICD-10-CM

## 2017-04-11 DIAGNOSIS — Z510 Encounter for antineoplastic radiation therapy: Secondary | ICD-10-CM | POA: Diagnosis not present

## 2017-04-11 NOTE — Progress Notes (Signed)
A user error has taken place: encounter opened in error, closed for administrative reasons.

## 2017-04-12 ENCOUNTER — Ambulatory Visit
Admission: RE | Admit: 2017-04-12 | Discharge: 2017-04-12 | Disposition: A | Discharge status: Home / Self Care | Payer: Medicare Other | Source: Ambulatory Visit | Attending: Radiation Oncology | Admitting: Radiation Oncology

## 2017-04-12 ENCOUNTER — Telehealth: Payer: Self-pay

## 2017-04-12 ENCOUNTER — Ambulatory Visit: Payer: Medicare Other | Admitting: Radiation Oncology

## 2017-04-12 ENCOUNTER — Telehealth: Payer: Self-pay | Admitting: *Deleted

## 2017-04-12 DIAGNOSIS — C01 Malignant neoplasm of base of tongue: Secondary | ICD-10-CM

## 2017-04-12 DIAGNOSIS — Z510 Encounter for antineoplastic radiation therapy: Secondary | ICD-10-CM | POA: Diagnosis not present

## 2017-04-12 LAB — BUN & CREATININE (CHCC)
BUN: 26 mg/dL (ref 7–26)
Creatinine: 0.93 mg/dL (ref 0.70–1.30)
GFR, Est AFR Am: >60 mL/min (ref >60)
GFR, Estimated: >60 mL/min (ref >60)

## 2017-04-12 NOTE — Telephone Encounter (Signed)
Scheduled patient for 3/1 nut class. Per 2/28 in basket message

## 2017-04-12 NOTE — Telephone Encounter (Signed)
Oncology Nurse Navigator Documentation  Rec'd call back from Dr. Gaspar Bidding.  He stated he has unchangeable commitment this afternoon, cannot start RT, will proceed with Monday New Start as previously scheduled.  Dr. Isidore Moos and [facility] 3 informed.  Gayleen Orem, RN, BSN Head & Neck Oncology Nurse [place] at [place] [phone]

## 2017-04-12 NOTE — Telephone Encounter (Signed)
Originally scheduled patient for tomorrow with Nut. Had to change his appointment due to unable to catch him here after labs today, or on the phone. Mailing patient a letter and calender of appointment. Per 2/27 in  [place].

## 2017-04-12 NOTE — Telephone Encounter (Signed)
Patient returned call, and said there is no need for a NUT class due to he has a feeding tube. Per 2/28 return calls.

## 2017-04-12 NOTE — Telephone Encounter (Signed)
I called and spoke to Dr. Gaspar Bidding. I informed him that his labs that were drawn today were  Normal. His BUN was 26, Cr. 0.93 and EGFR >60. He can resume his metformin. He voiced his understanding and knows to call me if he has any further questions or concerns.

## 2017-04-12 NOTE — Telephone Encounter (Signed)
Oncology Nurse Navigator Documentation  LVMM indicating RT can start today at 5:10 if he is available.  Asked for call back to confirm message receipt.  Gayleen Orem, RN, BSN Head & Neck Oncology Nurse [place] at [place] [phone]

## 2017-04-13 ENCOUNTER — Ambulatory Visit: Payer: Medicare Other | Admitting: Radiation Oncology

## 2017-04-13 ENCOUNTER — Telehealth: Payer: Self-pay | Admitting: *Deleted

## 2017-04-13 ENCOUNTER — Ambulatory Visit: Payer: Medicare Other

## 2017-04-13 ENCOUNTER — Encounter: Payer: Self-pay | Admitting: Nutrition

## 2017-04-13 NOTE — Telephone Encounter (Signed)
CALLED PATIENT TO INFORM OF APPT. WITH CARL [place] ON 04-16-17 @ 9:30 AM , SPOKE WITH PATIENT AND HE IS AWARE OF THIS APPT.

## 2017-04-16 ENCOUNTER — Ambulatory Visit: Payer: Medicare Other | Attending: Radiation Oncology

## 2017-04-16 ENCOUNTER — Ambulatory Visit
Admission: RE | Admit: 2017-04-16 | Discharge: 2017-04-16 | Disposition: A | Discharge status: Home / Self Care | Payer: Medicare Other | Source: Ambulatory Visit | Attending: Radiation Oncology | Admitting: Radiation Oncology

## 2017-04-16 ENCOUNTER — Inpatient Hospital Stay: Payer: Medicare Other | Attending: Radiation Oncology

## 2017-04-16 DIAGNOSIS — Z510 Encounter for antineoplastic radiation therapy: Secondary | ICD-10-CM | POA: Diagnosis not present

## 2017-04-16 DIAGNOSIS — C01 Malignant neoplasm of base of tongue: Secondary | ICD-10-CM | POA: Diagnosis not present

## 2017-04-16 DIAGNOSIS — R1313 Dysphagia, pharyngeal phase: Secondary | ICD-10-CM

## 2017-04-16 NOTE — Patient Instructions (Signed)
SWALLOWING EXERCISES Do these until 6 months after your last day of radiation, then 2 times per week afterwards  1. Effortful Swallows - Press your tongue against the roof of your mouth for 3 seconds, then squeeze          the muscles in your neck while you swallow your saliva or a sip of water - Repeat 20 times, 2-3 times a day, and use whenever you eat or drink  2. Masako Swallow - swallow with your tongue sticking out - Stick tongue out past your teeth and gently bite tongue with your teeth - Swallow, while holding your tongue with your teeth - Repeat 20 times, 2-3 times a day *use a wet spoon if your mouth gets dry*  3. Pitch Raise - Repeat "he", once per second in as high of a pitch as you can - Repeat 20 times, 2-3 times a day  4. Mendelsohn Maneuver - "half swallow" exercise - Start to swallow, and keep your Adam's apple up by squeezing hard with the            muscles of the throat - Hold the squeeze for 5-7 seconds and then relax - Repeat 20 times, 2-3 times a day *use a wet spoon if your mouth gets dry*  Breath Hold - Say "HUH!" loudly, then hold your breath for 3 seconds at your voice box - Repeat 20 times, 2-3 times a day  5. Chin pushback - Open your mouth  - Place your fist UNDER your chin near your neck, and push back with your fist for 5 seconds - Repeat 10 times, 2-3 times a day

## 2017-04-16 NOTE — Progress Notes (Signed)
Pt here for patient teaching.  Pt given Radiation and You booklet and Managing Acute Radiation Side Effects for Head and Neck Cancer handout.  Reviewed areas of pertinence such as fatigue, mouth changes, skin changes and throat changes . Pt able to give teach back of to pat skin and use unscented/gentle soap,avoid applying anything to skin within 4 hours of treatment and to use an electric razor if they must shave. Patient refused skin cream and will use over the counter [place]. Pt demonstrated understanding and verbalizes understanding of information given and will contact nursing with any questions or concerns.

## 2017-04-16 NOTE — Therapy (Signed)
[facility] [address] [place], [place], [address] Phone: [phone]   Fax:  [phone]  Speech Language Pathology Evaluation  Patient Details  Name: Brent Franklin MRN: [id number] Date of Birth: [birth year] Referring Provider: Eppie Gibson, MD   Encounter Date: 04/16/2017  End of Session - 04/16/17 1658    Visit Number  1    Number of Visits  3    Date for SLP Re-Evaluation  06/29/17    SLP Start Time  0940    SLP Stop Time   37    SLP Time Calculation (min)  39 min    Activity Tolerance  Patient tolerated treatment well       Past Medical History:  Diagnosis Date  . Cataract   . Diabetes mellitus without complication ([place])   . Hyperlipidemia   . Hypertension   . Inguinal hernia   . Insomnia 05/06/2013  . Rash 05/14/2013  . S/P radiation therapy 05/07/13- 06/20/13   Base of tongue/bilat neck 66 Gy, 33 fx  . Stroke [facility]) 2001   eye  . Tongue cancer ([place]) 04/18/2013   Base of tongue  . Tuberculosis    + PPD NO TX     Past Surgical History:  Procedure Laterality Date  . EYE SURGERY  2000, 2001   cataract  . INGUINAL HERNIA REPAIR  02/13/2012   Procedure: HERNIA REPAIR INGUINAL ADULT;  Surgeon: Adin Hector, MD;  Location: [facility];  Service: General;  Laterality: Right;  . INSERTION OF MESH  02/13/2012   Procedure: INSERTION OF MESH;  Surgeon: Adin Hector, MD;  Location: [facility];  Service: General;  Laterality: N/A;  . LAPAROSCOPIC GASTROSTOMY N/A 05/05/2013   Procedure: LAPAROSCOPIC GASTROSTOMY;  Surgeon: Adin Hector, MD;  Location: [facility];  Service: General;  Laterality: N/A;  . PORTACATH PLACEMENT N/A 05/05/2013   Procedure: INSERTION PORT-A-CATH;  Surgeon: Adin Hector, MD;  Location: [facility];  Service: General;  Laterality: N/A;    There were no vitals filed for this visit.  Subjective Assessment - 04/16/17 0950    Subjective  "My expection is to hopefully resume the very poor swallow  function 6 months ago."    Currently in Pain?  No/denies         SLP Evaluation [facility] - 04/16/17 0950      SLP Visit Information   SLP Received On  04/16/17    Referring Provider  Eppie Gibson, MD    Onset Date  November 2018    Medical Diagnosis  Base of tongue and epiglottis CA      Subjective   Subjective  Enters with hydophonic voice. Pt on 6 cans/day of tube feeding. Ageusia has persisted to today.      General Information   HPI  Pt known to SLP from previous course of ST in early 2015. Pt called to cancel remaining ST in October 2015 after 5 visits. During last skilled ST visit in August 2015 pt alternated solids and liquids with occasional "wet" voice, but was without overt s/s aspiration PNA. Pt now with hoarseness and worsening dysphgia since October 2018. Pt reports he ate very small amounts, rarely and used copious amounts of water to clear pharyngeally. PET confirmed recurrent epiglottic (lingual side) and base of tongue CA       Oral Motor/Sensory Function   Overall Oral Motor/Sensory Function  Appears within functional limits for tasks assessed    Labial ROM  Within Functional Limits  Labial Strength  Within Functional Limits    Lingual ROM  Within Functional Limits    Lingual Strength  Within Functional Limits    Lingual Coordination  WFL    Facial ROM  Within Functional Limits    Velum  Within Functional Limits      Motor Speech   Overall Motor Speech  Appears within functional limits for tasks assessed      Pt currently tolerates minimal POs safely, pt more or less has pleasure feeds rarely. He is PEG dependent, 6 cans tube feeding per day. Pt with definite fibrotic muscle tissue in submental and parathyroid areas, likely with negative effect upon his swallow response/reflex. POs: Pt drank teaspoons H2O with consistent throat clears, 4/4. Thyroid elevation appeared reduced, and swallows appeared timely. Oral residue noted as minimal. Pt's swallow deemed  non-functional at this time.   Because data states the risk for dysphagia during and after radiation treatment is high due to undergoing radiation tx, SLP taught pt about the possibility of reduced/limited ability for PO intake during rad tx. SLP encouraged pt to continue swallowing POs as far into rad tx as possible.   SLP educated pt re: changes to swallowing musculature after rad tx, and why adherence to dysphagia HEP provided today and PO consumption was necessary to reduce muscle fibrosis following rad tx. Pt demonstrated understanding of these things to SLP.    SLP then developed a HEP for pt and pt was instructed how to perform exercises involving lingual, vocal, and pharyngeal strengthening. SLP performed each exercise and pt return demonstrated each exercise. SLP ensured pt performance was correct prior to moving on to next exercise. Pt was instructed to complete this program 2 times a day, until 6 months after his or her last rad tx, then x2 a week after that.                 SLP Education - 04/16/17 1656    Education provided  Yes    Education Details  HEP procedure, what ST has to offer pt at this time    Person(s) Educated  Patient    Methods  Explanation;Demonstration;Handout    Comprehension  Verbalized understanding;Returned demonstration;Verbal cues required       SLP Short Term Goals - 04/16/17 1705      SLP SHORT TERM GOAL #1   Title  pt will demo HEP with rare min A    Time  1    Period  -- visits    Status  New      SLP SHORT TERM GOAL #2   Title  pt will tell SLP why he is completing HEP with min A    Time  1    Period  -- vists    Status  New       SLP Long Term Goals - 04/16/17 1706      SLP LONG TERM GOAL #1   Title  pt will demo HEP with modified independence    Time  2    Period  -- visits    Status  New      SLP LONG TERM GOAL #2   Title  pt will tell SLP 3 overt s/s aspiration PNA    Time  2    Period  -- visits    Status  New        Plan - 04/16/17 1659    Clinical Impression Statement  Pt presents today with severe dysphagia, due to  effects from radiation in 2015. See HPI for details. Pt returns due to recurrence in tongue base and epiglottis, undergoing 30 fractions for total 60Gy. Possibility for chemo afterwards. Pt told SLP he is comfortable with contacting MD for a script for ST, should he have furhter difficulties with swallowing, and NOT scheduling further ST at this time. Chart will remain open for approx 60 days. Pt would, ideally, benefit from skilled ST to monitor his accuracy with HEP, adn to monitor safety with any possible POs.     Speech Therapy Frequency  -- approx once every 4 weeks    Duration  -- 2 therapy visits    Treatment/Interventions  Aspiration precaution training;Diet toleration management by SLP;Trials of upgraded texture/liquids;Internal/external aids;Patient/family education;Compensatory strategies;SLP instruction and feedback    Potential to Achieve Goals  Fair    Potential Considerations  Cooperation/participation level;Severity of impairments;Previous level of function    SLP Home Exercise Plan  provided today    Consulted and Agree with Plan of Care  Patient       Patient will benefit from skilled therapeutic intervention in order to improve the following deficits and impairments:   Dysphagia, pharyngeal phase    Problem List Patient Active Problem List   Diagnosis Date Noted  . S/P percutaneous endoscopic gastrostomy (PEG) tube placement ([facility]) 12/15/2013  . Dysphagia 09/15/2013  . Malnutrition ([facility] [place]) 07/14/2013  . Hypomagnesemia 06/03/2013  . Type II or unspecified type diabetes mellitus without mention of complication, not stated as uncontrolled 06/03/2013  . Rash 05/14/2013  . Insomnia 05/06/2013  . Malignant neoplasm of base of tongue ([place]) 04/18/2013  . HTN (hypertension) 03/03/2013  . Right inguinal hernia 01/31/2012    [facility] ,[place], CCC-SLP  04/16/2017, 5:07  PM  [facility] [address] [place], [place], [address] Phone: [phone]   Fax:  [phone]  Name: Mainor Hellmann MRN: [id number] Date of Birth: [birth year]

## 2017-04-17 ENCOUNTER — Telehealth: Payer: Self-pay | Admitting: *Deleted

## 2017-04-17 ENCOUNTER — Ambulatory Visit
Admission: RE | Admit: 2017-04-17 | Discharge: 2017-04-17 | Disposition: A | Discharge status: Home / Self Care | Payer: Medicare Other | Source: Ambulatory Visit | Attending: Radiation Oncology | Admitting: Radiation Oncology

## 2017-04-17 DIAGNOSIS — Z510 Encounter for antineoplastic radiation therapy: Secondary | ICD-10-CM | POA: Diagnosis not present

## 2017-04-17 DIAGNOSIS — C01 Malignant neoplasm of base of tongue: Secondary | ICD-10-CM | POA: Diagnosis not present

## 2017-04-17 NOTE — Telephone Encounter (Signed)
CALLED PATIENT TO INFORM THAT MBS HAS BEEN CANCELLED, SPOKE WITH PATIENT AND HE IS AWARE OF THIS

## 2017-04-18 ENCOUNTER — Ambulatory Visit
Admission: RE | Admit: 2017-04-18 | Discharge: 2017-04-18 | Disposition: A | Discharge status: Home / Self Care | Payer: Medicare Other | Source: Ambulatory Visit | Attending: Radiation Oncology | Admitting: Radiation Oncology

## 2017-04-18 ENCOUNTER — Ambulatory Visit: Payer: Self-pay | Admitting: Hematology and Oncology

## 2017-04-18 DIAGNOSIS — Z510 Encounter for antineoplastic radiation therapy: Secondary | ICD-10-CM | POA: Diagnosis not present

## 2017-04-18 DIAGNOSIS — C01 Malignant neoplasm of base of tongue: Secondary | ICD-10-CM | POA: Diagnosis not present

## 2017-04-19 ENCOUNTER — Ambulatory Visit
Admission: RE | Admit: 2017-04-19 | Discharge: 2017-04-19 | Disposition: A | Discharge status: Home / Self Care | Payer: Medicare Other | Source: Ambulatory Visit | Attending: Radiation Oncology | Admitting: Radiation Oncology

## 2017-04-19 DIAGNOSIS — C01 Malignant neoplasm of base of tongue: Secondary | ICD-10-CM | POA: Diagnosis not present

## 2017-04-19 DIAGNOSIS — Z510 Encounter for antineoplastic radiation therapy: Secondary | ICD-10-CM | POA: Diagnosis not present

## 2017-04-20 ENCOUNTER — Ambulatory Visit
Admission: RE | Admit: 2017-04-20 | Discharge: 2017-04-20 | Disposition: A | Discharge status: Home / Self Care | Payer: Medicare Other | Source: Ambulatory Visit | Attending: Radiation Oncology | Admitting: Radiation Oncology

## 2017-04-20 DIAGNOSIS — Z510 Encounter for antineoplastic radiation therapy: Secondary | ICD-10-CM | POA: Diagnosis not present

## 2017-04-20 DIAGNOSIS — C01 Malignant neoplasm of base of tongue: Secondary | ICD-10-CM | POA: Diagnosis not present

## 2017-04-23 ENCOUNTER — Ambulatory Visit: Payer: Medicare Other | Admitting: Radiation Oncology

## 2017-04-23 ENCOUNTER — Ambulatory Visit
Admission: RE | Admit: 2017-04-23 | Discharge: 2017-04-23 | Disposition: A | Discharge status: Home / Self Care | Payer: Medicare Other | Source: Ambulatory Visit | Attending: Radiation Oncology | Admitting: Radiation Oncology

## 2017-04-23 DIAGNOSIS — C01 Malignant neoplasm of base of tongue: Secondary | ICD-10-CM | POA: Diagnosis not present

## 2017-04-23 DIAGNOSIS — Z510 Encounter for antineoplastic radiation therapy: Secondary | ICD-10-CM | POA: Diagnosis not present

## 2017-04-24 ENCOUNTER — Ambulatory Visit: Payer: Medicare Other

## 2017-04-24 ENCOUNTER — Telehealth: Payer: Self-pay

## 2017-04-24 ENCOUNTER — Ambulatory Visit
Admission: RE | Admit: 2017-04-24 | Discharge: 2017-04-24 | Disposition: A | Discharge status: Home / Self Care | Payer: Medicare Other | Source: Ambulatory Visit | Attending: Radiation Oncology | Admitting: Radiation Oncology

## 2017-04-24 DIAGNOSIS — C01 Malignant neoplasm of base of tongue: Secondary | ICD-10-CM | POA: Diagnosis not present

## 2017-04-24 DIAGNOSIS — Z510 Encounter for antineoplastic radiation therapy: Secondary | ICD-10-CM | POA: Diagnosis not present

## 2017-04-24 NOTE — Telephone Encounter (Signed)
Received call from pt today that he would like to cancel appt on 3/19 with Dr. Irene Limbo because he is receiving current radiation treatment and being followed by his physician there. Spoke with Dr. Irene Limbo, okay to cancel and pt will call to f/u if needed.

## 2017-04-25 ENCOUNTER — Encounter: Payer: Self-pay | Admitting: Nutrition

## 2017-04-25 ENCOUNTER — Ambulatory Visit
Admission: RE | Admit: 2017-04-25 | Discharge: 2017-04-25 | Disposition: A | Discharge status: Home / Self Care | Payer: Medicare Other | Source: Ambulatory Visit | Attending: Radiation Oncology | Admitting: Radiation Oncology

## 2017-04-25 DIAGNOSIS — C01 Malignant neoplasm of base of tongue: Secondary | ICD-10-CM | POA: Diagnosis not present

## 2017-04-25 DIAGNOSIS — Z510 Encounter for antineoplastic radiation therapy: Secondary | ICD-10-CM | POA: Diagnosis not present

## 2017-04-26 ENCOUNTER — Ambulatory Visit
Admission: RE | Admit: 2017-04-26 | Discharge: 2017-04-26 | Disposition: A | Discharge status: Home / Self Care | Payer: Medicare Other | Source: Ambulatory Visit | Attending: Radiation Oncology | Admitting: Radiation Oncology

## 2017-04-26 DIAGNOSIS — Z510 Encounter for antineoplastic radiation therapy: Secondary | ICD-10-CM | POA: Diagnosis not present

## 2017-04-26 DIAGNOSIS — C01 Malignant neoplasm of base of tongue: Secondary | ICD-10-CM | POA: Diagnosis not present

## 2017-04-27 ENCOUNTER — Ambulatory Visit
Admission: RE | Admit: 2017-04-27 | Discharge: 2017-04-27 | Disposition: A | Discharge status: Home / Self Care | Payer: Medicare Other | Source: Ambulatory Visit | Attending: Radiation Oncology | Admitting: Radiation Oncology

## 2017-04-27 DIAGNOSIS — Z510 Encounter for antineoplastic radiation therapy: Secondary | ICD-10-CM | POA: Diagnosis not present

## 2017-04-27 DIAGNOSIS — C01 Malignant neoplasm of base of tongue: Secondary | ICD-10-CM | POA: Diagnosis not present

## 2017-04-30 ENCOUNTER — Other Ambulatory Visit: Payer: Self-pay | Admitting: Radiation Oncology

## 2017-04-30 ENCOUNTER — Ambulatory Visit
Admission: RE | Admit: 2017-04-30 | Discharge: 2017-04-30 | Disposition: A | Discharge status: Home / Self Care | Payer: Medicare Other | Source: Ambulatory Visit | Attending: Radiation Oncology | Admitting: Radiation Oncology

## 2017-04-30 DIAGNOSIS — C01 Malignant neoplasm of base of tongue: Secondary | ICD-10-CM | POA: Diagnosis not present

## 2017-04-30 DIAGNOSIS — Z510 Encounter for antineoplastic radiation therapy: Secondary | ICD-10-CM | POA: Diagnosis not present

## 2017-04-30 DIAGNOSIS — C100 Malignant neoplasm of vallecula: Secondary | ICD-10-CM

## 2017-04-30 MED ORDER — ZOLPIDEM TARTRATE 5 MG PO TABS: 5.0000 mg | ORAL_TABLET | Freq: Every evening | ORAL | 0 refills | Status: DC | PRN

## 2017-04-30 MED ORDER — LIDOCAINE VISCOUS 2 % MT SOLN
OROMUCOSAL | 5 refills | Status: DC
Start: 2017-04-30 — End: 2018-01-10

## 2017-04-30 MED FILL — FLUORISHIELD 1.1% GEL: 1.1 % | 60 days supply | Qty: 228 | Fill #0

## 2017-04-30 MED FILL — ZOLPIDEM TARTRATE 5 MG TABL: 5 | 30 days supply | Qty: 30 | Fill #0

## 2017-05-01 ENCOUNTER — Ambulatory Visit
Admission: RE | Admit: 2017-05-01 | Discharge: 2017-05-01 | Disposition: A | Discharge status: Home / Self Care | Payer: Medicare Other | Source: Ambulatory Visit | Attending: Radiation Oncology | Admitting: Radiation Oncology

## 2017-05-01 ENCOUNTER — Other Ambulatory Visit: Payer: Self-pay

## 2017-05-01 ENCOUNTER — Ambulatory Visit: Payer: Self-pay | Admitting: Hematology

## 2017-05-01 DIAGNOSIS — Z510 Encounter for antineoplastic radiation therapy: Secondary | ICD-10-CM | POA: Diagnosis not present

## 2017-05-01 DIAGNOSIS — C01 Malignant neoplasm of base of tongue: Secondary | ICD-10-CM | POA: Diagnosis not present

## 2017-05-02 ENCOUNTER — Ambulatory Visit
Admission: RE | Admit: 2017-05-02 | Discharge: 2017-05-02 | Disposition: A | Discharge status: Home / Self Care | Payer: Medicare Other | Source: Ambulatory Visit | Attending: Radiation Oncology | Admitting: Radiation Oncology

## 2017-05-02 DIAGNOSIS — Z510 Encounter for antineoplastic radiation therapy: Secondary | ICD-10-CM | POA: Diagnosis not present

## 2017-05-02 DIAGNOSIS — C01 Malignant neoplasm of base of tongue: Secondary | ICD-10-CM | POA: Diagnosis not present

## 2017-05-03 ENCOUNTER — Ambulatory Visit
Admission: RE | Admit: 2017-05-03 | Discharge: 2017-05-03 | Disposition: A | Discharge status: Home / Self Care | Payer: Medicare Other | Source: Ambulatory Visit | Attending: Radiation Oncology | Admitting: Radiation Oncology

## 2017-05-03 DIAGNOSIS — Z510 Encounter for antineoplastic radiation therapy: Secondary | ICD-10-CM | POA: Diagnosis not present

## 2017-05-03 DIAGNOSIS — C01 Malignant neoplasm of base of tongue: Secondary | ICD-10-CM | POA: Diagnosis not present

## 2017-05-04 ENCOUNTER — Ambulatory Visit
Admission: RE | Admit: 2017-05-04 | Discharge: 2017-05-04 | Disposition: A | Discharge status: Home / Self Care | Payer: Medicare Other | Source: Ambulatory Visit | Attending: Radiation Oncology | Admitting: Radiation Oncology

## 2017-05-04 DIAGNOSIS — Z510 Encounter for antineoplastic radiation therapy: Secondary | ICD-10-CM | POA: Diagnosis not present

## 2017-05-04 DIAGNOSIS — C01 Malignant neoplasm of base of tongue: Secondary | ICD-10-CM | POA: Diagnosis not present

## 2017-05-07 ENCOUNTER — Other Ambulatory Visit: Payer: Self-pay | Admitting: Radiation Oncology

## 2017-05-07 ENCOUNTER — Ambulatory Visit
Admission: RE | Admit: 2017-05-07 | Discharge: 2017-05-07 | Disposition: A | Discharge status: Home / Self Care | Payer: Medicare Other | Source: Ambulatory Visit | Attending: Radiation Oncology | Admitting: Radiation Oncology

## 2017-05-07 DIAGNOSIS — C01 Malignant neoplasm of base of tongue: Secondary | ICD-10-CM | POA: Diagnosis not present

## 2017-05-07 DIAGNOSIS — C801 Malignant (primary) neoplasm, unspecified: Secondary | ICD-10-CM

## 2017-05-07 DIAGNOSIS — Z510 Encounter for antineoplastic radiation therapy: Secondary | ICD-10-CM | POA: Diagnosis not present

## 2017-05-07 MED ORDER — HYDROCODONE-ACETAMINOPHEN 7.5-325 MG/15ML PO SOLN: 10.0000 mL | ORAL | 0 refills | Status: DC | PRN

## 2017-05-08 ENCOUNTER — Ambulatory Visit
Admission: RE | Admit: 2017-05-08 | Discharge: 2017-05-08 | Disposition: A | Discharge status: Home / Self Care | Payer: Medicare Other | Source: Ambulatory Visit | Attending: Radiation Oncology | Admitting: Radiation Oncology

## 2017-05-08 DIAGNOSIS — C01 Malignant neoplasm of base of tongue: Secondary | ICD-10-CM | POA: Diagnosis not present

## 2017-05-08 DIAGNOSIS — Z510 Encounter for antineoplastic radiation therapy: Secondary | ICD-10-CM | POA: Diagnosis not present

## 2017-05-09 ENCOUNTER — Ambulatory Visit: Admission: RE | Admit: 2017-05-09 | Payer: Medicare Other | Source: Ambulatory Visit

## 2017-05-09 MED FILL — HYDROCOD-APAP 7.5-325/15ML: 7.5-325 | 3 days supply | Qty: 180 | Fill #0

## 2017-05-10 ENCOUNTER — Ambulatory Visit
Admission: RE | Admit: 2017-05-10 | Discharge: 2017-05-10 | Disposition: A | Discharge status: Home / Self Care | Payer: Medicare Other | Source: Ambulatory Visit | Attending: Radiation Oncology | Admitting: Radiation Oncology

## 2017-05-10 DIAGNOSIS — Z510 Encounter for antineoplastic radiation therapy: Secondary | ICD-10-CM | POA: Diagnosis not present

## 2017-05-10 DIAGNOSIS — C01 Malignant neoplasm of base of tongue: Secondary | ICD-10-CM | POA: Diagnosis not present

## 2017-05-11 ENCOUNTER — Ambulatory Visit
Admission: RE | Admit: 2017-05-11 | Discharge: 2017-05-11 | Disposition: A | Discharge status: Home / Self Care | Payer: Medicare Other | Source: Ambulatory Visit | Attending: Radiation Oncology | Admitting: Radiation Oncology

## 2017-05-11 DIAGNOSIS — C01 Malignant neoplasm of base of tongue: Secondary | ICD-10-CM | POA: Diagnosis not present

## 2017-05-11 DIAGNOSIS — Z510 Encounter for antineoplastic radiation therapy: Secondary | ICD-10-CM | POA: Diagnosis not present

## 2017-05-14 ENCOUNTER — Ambulatory Visit
Admission: RE | Admit: 2017-05-14 | Discharge: 2017-05-14 | Disposition: A | Discharge status: Home / Self Care | Payer: Medicare Other | Source: Ambulatory Visit | Attending: Radiation Oncology | Admitting: Radiation Oncology

## 2017-05-14 DIAGNOSIS — C01 Malignant neoplasm of base of tongue: Secondary | ICD-10-CM | POA: Insufficient documentation

## 2017-05-14 DIAGNOSIS — Z510 Encounter for antineoplastic radiation therapy: Secondary | ICD-10-CM | POA: Insufficient documentation

## 2017-05-15 ENCOUNTER — Ambulatory Visit
Admission: RE | Admit: 2017-05-15 | Discharge: 2017-05-15 | Disposition: A | Discharge status: Home / Self Care | Payer: Medicare Other | Source: Ambulatory Visit | Attending: Radiation Oncology | Admitting: Radiation Oncology

## 2017-05-15 DIAGNOSIS — C01 Malignant neoplasm of base of tongue: Secondary | ICD-10-CM | POA: Diagnosis not present

## 2017-05-15 DIAGNOSIS — Z510 Encounter for antineoplastic radiation therapy: Secondary | ICD-10-CM | POA: Diagnosis not present

## 2017-05-16 ENCOUNTER — Ambulatory Visit
Admission: RE | Admit: 2017-05-16 | Discharge: 2017-05-16 | Disposition: A | Discharge status: Home / Self Care | Payer: Medicare Other | Source: Ambulatory Visit | Attending: Radiation Oncology | Admitting: Radiation Oncology

## 2017-05-16 DIAGNOSIS — Z510 Encounter for antineoplastic radiation therapy: Secondary | ICD-10-CM | POA: Diagnosis not present

## 2017-05-16 DIAGNOSIS — C01 Malignant neoplasm of base of tongue: Secondary | ICD-10-CM | POA: Diagnosis not present

## 2017-05-17 ENCOUNTER — Ambulatory Visit
Admission: RE | Admit: 2017-05-17 | Discharge: 2017-05-17 | Disposition: A | Discharge status: Home / Self Care | Payer: Medicare Other | Source: Ambulatory Visit | Attending: Radiation Oncology | Admitting: Radiation Oncology

## 2017-05-17 DIAGNOSIS — Z510 Encounter for antineoplastic radiation therapy: Secondary | ICD-10-CM | POA: Diagnosis not present

## 2017-05-17 DIAGNOSIS — C01 Malignant neoplasm of base of tongue: Secondary | ICD-10-CM | POA: Diagnosis not present

## 2017-05-18 ENCOUNTER — Ambulatory Visit
Admission: RE | Admit: 2017-05-18 | Discharge: 2017-05-18 | Disposition: A | Discharge status: Home / Self Care | Payer: Medicare Other | Source: Ambulatory Visit | Attending: Radiation Oncology | Admitting: Radiation Oncology

## 2017-05-18 DIAGNOSIS — Z510 Encounter for antineoplastic radiation therapy: Secondary | ICD-10-CM | POA: Diagnosis not present

## 2017-05-18 DIAGNOSIS — C01 Malignant neoplasm of base of tongue: Secondary | ICD-10-CM | POA: Diagnosis not present

## 2017-05-21 ENCOUNTER — Other Ambulatory Visit: Payer: Self-pay | Admitting: Radiation Oncology

## 2017-05-21 ENCOUNTER — Ambulatory Visit
Admission: RE | Admit: 2017-05-21 | Discharge: 2017-05-21 | Disposition: A | Discharge status: Home / Self Care | Payer: Medicare Other | Source: Ambulatory Visit | Attending: Radiation Oncology | Admitting: Radiation Oncology

## 2017-05-21 DIAGNOSIS — C01 Malignant neoplasm of base of tongue: Secondary | ICD-10-CM | POA: Diagnosis not present

## 2017-05-21 DIAGNOSIS — C801 Malignant (primary) neoplasm, unspecified: Secondary | ICD-10-CM

## 2017-05-21 DIAGNOSIS — Z510 Encounter for antineoplastic radiation therapy: Secondary | ICD-10-CM | POA: Diagnosis not present

## 2017-05-22 ENCOUNTER — Ambulatory Visit
Admission: RE | Admit: 2017-05-22 | Discharge: 2017-05-22 | Disposition: A | Discharge status: Home / Self Care | Payer: Medicare Other | Source: Ambulatory Visit | Attending: Radiation Oncology | Admitting: Radiation Oncology

## 2017-05-22 DIAGNOSIS — C01 Malignant neoplasm of base of tongue: Secondary | ICD-10-CM | POA: Diagnosis not present

## 2017-05-22 DIAGNOSIS — Z510 Encounter for antineoplastic radiation therapy: Secondary | ICD-10-CM | POA: Diagnosis not present

## 2017-05-22 MED FILL — HYDROCOD-APAP 7.5-325/15ML: 7.5-325 | 4 days supply | Qty: 293 | Fill #1

## 2017-05-23 ENCOUNTER — Ambulatory Visit: Payer: Medicare Other

## 2017-05-23 ENCOUNTER — Ambulatory Visit
Admission: RE | Admit: 2017-05-23 | Discharge: 2017-05-23 | Disposition: A | Discharge status: Home / Self Care | Payer: Medicare Other | Source: Ambulatory Visit | Attending: Radiation Oncology | Admitting: Radiation Oncology

## 2017-05-23 DIAGNOSIS — Z510 Encounter for antineoplastic radiation therapy: Secondary | ICD-10-CM | POA: Diagnosis not present

## 2017-05-23 DIAGNOSIS — C01 Malignant neoplasm of base of tongue: Secondary | ICD-10-CM | POA: Diagnosis not present

## 2017-05-24 ENCOUNTER — Ambulatory Visit
Admission: RE | Admit: 2017-05-24 | Discharge: 2017-05-24 | Disposition: A | Discharge status: Home / Self Care | Payer: Medicare Other | Source: Ambulatory Visit | Attending: Radiation Oncology | Admitting: Radiation Oncology

## 2017-05-24 ENCOUNTER — Ambulatory Visit: Payer: Medicare Other

## 2017-05-24 DIAGNOSIS — Z510 Encounter for antineoplastic radiation therapy: Secondary | ICD-10-CM | POA: Diagnosis not present

## 2017-05-24 DIAGNOSIS — C01 Malignant neoplasm of base of tongue: Secondary | ICD-10-CM | POA: Diagnosis not present

## 2017-05-25 ENCOUNTER — Ambulatory Visit: Payer: Medicare Other

## 2017-05-25 ENCOUNTER — Ambulatory Visit
Admission: RE | Admit: 2017-05-25 | Discharge: 2017-05-25 | Disposition: A | Discharge status: Home / Self Care | Payer: Medicare Other | Source: Ambulatory Visit | Attending: Radiation Oncology | Admitting: Radiation Oncology

## 2017-05-25 DIAGNOSIS — C100 Malignant neoplasm of vallecula: Secondary | ICD-10-CM | POA: Insufficient documentation

## 2017-05-25 DIAGNOSIS — Z510 Encounter for antineoplastic radiation therapy: Secondary | ICD-10-CM | POA: Insufficient documentation

## 2017-05-25 DIAGNOSIS — C01 Malignant neoplasm of base of tongue: Secondary | ICD-10-CM | POA: Diagnosis not present

## 2017-05-28 ENCOUNTER — Ambulatory Visit
Admission: RE | Admit: 2017-05-28 | Discharge: 2017-05-28 | Disposition: A | Discharge status: Home / Self Care | Payer: Medicare Other | Source: Ambulatory Visit | Attending: Radiation Oncology | Admitting: Radiation Oncology

## 2017-05-28 DIAGNOSIS — Z510 Encounter for antineoplastic radiation therapy: Secondary | ICD-10-CM | POA: Diagnosis not present

## 2017-05-28 DIAGNOSIS — C100 Malignant neoplasm of vallecula: Secondary | ICD-10-CM | POA: Diagnosis not present

## 2017-05-28 DIAGNOSIS — C01 Malignant neoplasm of base of tongue: Secondary | ICD-10-CM | POA: Diagnosis not present

## 2017-05-29 ENCOUNTER — Encounter: Payer: Self-pay | Admitting: *Deleted

## 2017-05-29 NOTE — Progress Notes (Signed)
Oncology Nurse Navigator Documentation  Navigator Flowsheet update.  Gayleen Orem, RN, BSN Head & Neck Oncology Nurse [facility] at [place] [phone]

## 2017-06-08 ENCOUNTER — Other Ambulatory Visit: Payer: Self-pay | Admitting: Radiation Oncology

## 2017-06-08 DIAGNOSIS — C100 Malignant neoplasm of vallecula: Secondary | ICD-10-CM

## 2017-06-08 MED FILL — ZOLPIDEM TARTRATE 5 MG TAB: 5 | 30 days supply | Qty: 30 | Fill #0

## 2017-06-13 ENCOUNTER — Encounter: Payer: Self-pay | Admitting: Radiation Oncology

## 2017-06-13 NOTE — Progress Notes (Signed)
  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 At the beginning of his treatments, he reported that he was able to eat small amounts of eggs, soup, and cream of wheat. He was instilling 5 cans of 350 calories nutritional supplement via his PEG tube. He denied pain at the time. At the end of treatment, he noted thick sputum. He reports that he was receiving all of his nutrition from his feeding tube. On PE, it was noted hoarse voice and no obvious skin irritation over neck.   Plan: The patient has completed radiation treatment. The patient will return to radiation oncology clinic for routine followup in one month. I advised them to call or return sooner if they have any questions or concerns related to their recovery or treatment.  -----------------------------------  Eppie Gibson, MD  This document serves as a record of services personally performed by Eppie Gibson, MD. It was created on her behalf by Steva Colder, a trained medical scribe. The creation of this record is based on the scribe's personal observations and the provider's statements to them. This document has been checked and approved by the attending provider.

## 2017-06-15 ENCOUNTER — Telehealth: Payer: Self-pay | Admitting: *Deleted

## 2017-06-15 NOTE — Telephone Encounter (Signed)
Oncology Nurse Navigator Documentation  Faxed recertification for supplies to [facility].  Notification of successful fax transmission received.  Gayleen Orem, RN, BSN Head & Neck Oncology Nurse [facility] at [place] [phone]

## 2017-06-29 ENCOUNTER — Encounter: Payer: Self-pay | Admitting: Radiation Oncology

## 2017-06-29 NOTE — Progress Notes (Signed)
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   Pain issues, if any: He denies Using a feeding tube?: Yes, he continues to instill 5 cans daily and free water.  Weight changes, if any:  05/14/17 168.8 lb 05/21/17 167.0 lb 05/25/17 166.2 lb 07/06/17 161.0 lb Swallowing issues, if any: He reports continued difficulty swallowing. He has tried yogurt without much success. He does tell me that it has improved slightly since completing radiation.  Smoking or chewing tobacco? No Using fluoride trays daily? No Last ENT visit was on: Dr. Wilburn Cornelia, not since 12/18. Other notable issues, if any:  He has continued fatigue. He has felt drained since completely radiation and is slow to recover.  He continues to have thick saliva production, but also reports improvement recently.   BP 119/72 (BP Location: Right Arm, Patient Position: Sitting, Cuff Size: Normal)   Pulse 77   Temp 97.9 F (36.6 C) (Oral)   Resp 18   Wt 161 lb 6.4 oz (73.2 kg)   SpO2 98%   BMI 20.72 kg/m

## 2017-07-06 ENCOUNTER — Ambulatory Visit
Admission: RE | Admit: 2017-07-06 | Discharge: 2017-07-06 | Disposition: A | Discharge status: Home / Self Care | Payer: Medicare Other | Source: Ambulatory Visit | Attending: Radiation Oncology | Admitting: Radiation Oncology

## 2017-07-06 ENCOUNTER — Encounter: Payer: Self-pay | Admitting: Radiation Oncology

## 2017-07-06 VITALS — BP 119/72 | HR 77 | Temp 97.9°F | Resp 18 | Wt 161.4 lb

## 2017-07-06 DIAGNOSIS — C100 Malignant neoplasm of vallecula: Secondary | ICD-10-CM

## 2017-07-06 DIAGNOSIS — Z7984 Long term (current) use of oral hypoglycemic drugs: Secondary | ICD-10-CM | POA: Insufficient documentation

## 2017-07-06 DIAGNOSIS — Z1329 Encounter for screening for other suspected endocrine disorder: Secondary | ICD-10-CM

## 2017-07-06 DIAGNOSIS — C7989 Secondary malignant neoplasm of other specified sites: Secondary | ICD-10-CM | POA: Diagnosis not present

## 2017-07-06 DIAGNOSIS — Z923 Personal history of irradiation: Secondary | ICD-10-CM | POA: Insufficient documentation

## 2017-07-06 DIAGNOSIS — Z79899 Other long term (current) drug therapy: Secondary | ICD-10-CM | POA: Diagnosis not present

## 2017-07-06 DIAGNOSIS — C01 Malignant neoplasm of base of tongue: Secondary | ICD-10-CM | POA: Diagnosis not present

## 2017-07-06 DIAGNOSIS — R634 Abnormal weight loss: Secondary | ICD-10-CM

## 2017-07-06 MED ORDER — GUAIFENESIN-CODEINE 100-10 MG/5ML PO SOLN: 5.0000 mL | Freq: Four times a day (QID) | ORAL | 3 refills | Status: DC | PRN

## 2017-07-06 MED ORDER — ZOLPIDEM TARTRATE 5 MG PO TABS: 5.0000 mg | ORAL_TABLET | Freq: Every evening | ORAL | 3 refills | Status: DC | PRN

## 2017-07-06 MED FILL — FLUORISHIELD 1.1% GEL: 1.1 % | 60 days supply | Qty: 228 | Fill #1

## 2017-07-06 NOTE — Progress Notes (Signed)
Radiation Oncology         [phone]) [phone] ________________________________  Name: Brent Franklin MRN: [id number]  Date: 07/06/2017  DOB: November 30, 1931  Follow-Up Visit Note  CC: Prince Solian, MD  Heath Lark, MD  Diagnosis and Prior Radiotherapy:       ICD-10-CM   1. Screening for hypothyroidism Z13.29 TSH    T4, free  2. 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 Malignant neoplasm of base of tongue (HCC) C01 TSH    T4, free  4. 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                His last ENT visit with Dr. Wilburn Cornelia was on 12/172018.  On review of systems, the patient denies any pain issues. He has a feeding tube and continues to instill 5 cans daily and free water. Denies hemoptysis. Denies any new lumps or bumps over the neck. He reports continued difficulty swallowing. He has tried yogurt without much success. He does say it has improved slightly since completing radiation. He is not using fluoride trays daily. He has continued fatigue. He has felt drained since completing radiation and is slower to recover, but does state his stamina is improving. He continues to have thick saliva production, but also reports improvement recently.  He is not interested in ENT f/u before PET is done, would like to  minimize appointments and procedures. Requests ambien and robitussin Rxs    ALLERGIES:  has No Known Allergies.  Meds: Current Outpatient Medications  Medication Sig Dispense Refill  . aspirin 81 MG tablet Take 81 mg by mouth daily.    . metFORMIN (GLUCOPHAGE) 850 MG tablet Take 850 mg by mouth 2 (two) times daily with a meal.    . zolpidem (AMBIEN) 5 MG tablet Take 1 tablet (5 mg total) by mouth at bedtime as needed. PRN insomnia. 30 tablet 3  . guaiFENesin-codeine 100-10 MG/5ML syrup Take 5 mLs by mouth every 6 (six) hours as needed for cough. 360 mL 3  . HYDROcodone-acetaminophen (HYCET) 7.5-325 mg/15 ml solution Take 10 mLs by mouth every 4 (four) hours as needed for moderate pain (or cough). (Patient not taking: Reported on 07/06/2017) 473 mL 0  . lidocaine (XYLOCAINE) 2 % solution Patient: Mix 1part 2% viscous lidocaine, 1part H20. Swallow 51mL of diluted mixture, 2min before meals and at bedtime, up to QID (Patient not taking: Reported on 07/06/2017) 100 mL 5   No current facility-administered medications for this encounter.    Facility-Administered Medications Ordered in Other Encounters  Medication Dose Route Frequency Provider Last Rate Last Dose  . topical emolient (BIAFINE) emulsion   Topical BID Eppie Gibson, MD        Physical Findings: The patient is in no acute  distress. Patient is alert and oriented. Wt Readings from Last 3 Encounters:  07/06/17 161 lb 6.4 oz (73.2 kg)  04/10/17 173 lb 9.6 oz (78.7 kg)  04/10/17 174 lb (78.9 kg)    weight is 161 lb 6.4 oz (73.2 kg). His oral temperature is 97.9 F (36.6 C). His blood pressure is 119/72 and his pulse is 77. His respiration is 18 and oxygen saturation is 98%. .  General: Alert and oriented, in no acute distress HEENT: Head is normocephalic. Extraocular movements are intact. Oral cavity is clear.  Neck: Neck is notable for mild lymphedema in the anterior neck. Skin looks excellent over his neck. No palpable masses  appreciated. Skin: Skin in treatment fields shows satisfactory healing  Lymphatics: see Neck Exam     Lab Findings: Lab Results  Component Value Date   WBC 6.5 06/17/2013   HGB 12.6 (L) 06/17/2013   HCT 39.0 06/17/2013   MCV 85.7 06/17/2013   PLT 205 06/17/2013    Lab Results  Component Value Date   TSH 4.244 (H) 02/21/2017    Radiographic Findings: No results found.  Impression/Plan:    1) Head and Neck Cancer Status: Discussed plan for PET scan, as well as  laryngoscopy following scan, in 2 months, for monitoring. He is in agreement.  2) Nutritional Status: some weight loss - push intake as tolerated PEG tube: Continues to instill 5 cans daily and free water.  3)  Swallowing: Continued difficulty swallowing.  SLP has seen patient.  4) Dental: Encouraged to continue regular followup with dentistry, and dental hygiene including fluoride rinses.   5) Thyroid function:  Lab Results  Component Value Date   TSH 4.244 (H) 02/21/2017  T4 was normal   Recheck in July  7) Other: Refilled ambien and Rx Robitussin today for symptoms  8) Follow-up in 2 months and review PET scan results. The patient was encouraged to call with any issues or questions before then.   _____________________________________   Eppie Gibson, MD   This document serves as a record of services personally performed by Eppie Gibson, MD. It was created on her behalf by Arlyce Harman, a trained medical scribe. The creation of this record is based on the scribe's personal observations and the provider's statements to them. This document has been checked and approved by the attending provider.

## 2017-08-10 ENCOUNTER — Ambulatory Visit: Payer: Self-pay | Admitting: Radiation Oncology

## 2017-08-20 NOTE — Progress Notes (Signed)
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   Pain issues, if any: He denies.  Using a feeding tube?: Yes, chronic PEG use.  He is doing 5 cans of supplement daily. Weight changes, if any:  Wt Readings from Last 3 Encounters:  08/28/17 159 lb 6.4 oz (72.3 kg)  07/06/17 161 lb 6.4 oz (73.2 kg)  04/10/17 173 lb 9.6 oz (78.7 kg)   Swallowing issues, if any: He is not swallowing well. He is not taking any supplemental intake.  Smoking or chewing tobacco? No Using fluoride trays daily? He is using trays nightly.  Last ENT visit was on: He is not seeing an ENT.  Other notable issues, if any:  He denies any further symptoms related to his radiation PET results.   BP 136/67 (BP Location: Right Arm, Patient Position: Sitting, Cuff Size: Normal)   Pulse 79   Temp 98.3 F (36.8 C) (Oral)   Resp 20   Ht 6\' 2"  (1.88 m)   Wt 159 lb 6.4 oz (72.3 kg)   SpO2 96%   BMI 20.47 kg/m

## 2017-08-27 ENCOUNTER — Ambulatory Visit (HOSPITAL_COMMUNITY)
Admission: RE | Admit: 2017-08-27 | Discharge: 2017-08-27 | Disposition: A | Discharge status: Home / Self Care | Payer: Medicare Other | Source: Ambulatory Visit | Attending: Radiation Oncology | Admitting: Radiation Oncology

## 2017-08-27 DIAGNOSIS — I700 Atherosclerosis of aorta: Secondary | ICD-10-CM | POA: Diagnosis not present

## 2017-08-27 DIAGNOSIS — K8020 Calculus of gallbladder without cholecystitis without obstruction: Secondary | ICD-10-CM | POA: Insufficient documentation

## 2017-08-27 DIAGNOSIS — C100 Malignant neoplasm of vallecula: Secondary | ICD-10-CM | POA: Diagnosis not present

## 2017-08-27 DIAGNOSIS — I2510 Atherosclerotic heart disease of native coronary artery without angina pectoris: Secondary | ICD-10-CM | POA: Diagnosis not present

## 2017-08-27 DIAGNOSIS — C109 Malignant neoplasm of oropharynx, unspecified: Secondary | ICD-10-CM | POA: Diagnosis not present

## 2017-08-27 LAB — GLUCOSE, CAPILLARY: Glucose-Capillary: 105 mg/dL — ABNORMAL HIGH (ref 70–99)

## 2017-08-27 MED ORDER — FLUDEOXYGLUCOSE F - 18 (FDG) INJECTION
7.8800 | Freq: Once | INTRAVENOUS | Status: AC | PRN
  Administered 2017-08-27: 7.88 via INTRAVENOUS

## 2017-08-28 ENCOUNTER — Ambulatory Visit
Admission: RE | Admit: 2017-08-28 | Discharge: 2017-08-28 | Disposition: A | Discharge status: Home / Self Care | Payer: Medicare Other | Source: Ambulatory Visit | Attending: Radiation Oncology | Admitting: Radiation Oncology

## 2017-08-28 ENCOUNTER — Encounter: Payer: Self-pay | Admitting: Radiation Oncology

## 2017-08-28 VITALS — BP 136/67 | HR 79 | Temp 98.3°F | Resp 20 | Ht 74.0 in | Wt 159.4 lb

## 2017-08-28 DIAGNOSIS — C01 Malignant neoplasm of base of tongue: Secondary | ICD-10-CM | POA: Diagnosis not present

## 2017-08-28 DIAGNOSIS — Z7982 Long term (current) use of aspirin: Secondary | ICD-10-CM | POA: Diagnosis not present

## 2017-08-28 DIAGNOSIS — I700 Atherosclerosis of aorta: Secondary | ICD-10-CM | POA: Diagnosis not present

## 2017-08-28 DIAGNOSIS — Z79899 Other long term (current) drug therapy: Secondary | ICD-10-CM | POA: Diagnosis not present

## 2017-08-28 DIAGNOSIS — Z7984 Long term (current) use of oral hypoglycemic drugs: Secondary | ICD-10-CM | POA: Insufficient documentation

## 2017-08-28 DIAGNOSIS — K8020 Calculus of gallbladder without cholecystitis without obstruction: Secondary | ICD-10-CM | POA: Diagnosis not present

## 2017-08-28 DIAGNOSIS — I2510 Atherosclerotic heart disease of native coronary artery without angina pectoris: Secondary | ICD-10-CM | POA: Insufficient documentation

## 2017-08-28 DIAGNOSIS — Z08 Encounter for follow-up examination after completed treatment for malignant neoplasm: Secondary | ICD-10-CM | POA: Diagnosis not present

## 2017-08-28 DIAGNOSIS — R1310 Dysphagia, unspecified: Secondary | ICD-10-CM | POA: Insufficient documentation

## 2017-08-28 DIAGNOSIS — Z923 Personal history of irradiation: Secondary | ICD-10-CM | POA: Insufficient documentation

## 2017-08-28 DIAGNOSIS — C100 Malignant neoplasm of vallecula: Secondary | ICD-10-CM | POA: Diagnosis not present

## 2017-08-28 DIAGNOSIS — Z9989 Dependence on other enabling machines and devices: Secondary | ICD-10-CM | POA: Diagnosis not present

## 2017-08-28 MED ORDER — LARYNGOSCOPY SOLUTION RAD-ONC
15.0000 mL | Freq: Once | TOPICAL | Status: AC
  Administered 2017-08-28: 15 mL via TOPICAL
  Filled 2017-08-28: qty 15

## 2017-08-28 NOTE — Progress Notes (Signed)
Radiation Oncology         [phone] ________________________________  Name: Brent Franklin MRN: [id number]  Date: 08/28/2017  DOB: [birth year]  Follow-Up Visit Note  CC: Prince Solian, MD  Olevia Bowens, MD  Diagnosis and Prior Radiotherapy:       ICD-10-CM   1. 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                On 08/27/2017 he underwent a PET showing small focus of hypermetabolism at the base of the tongue/vallecula, decreased in metabolism, with associated decreased soft tissue thickening on the CT images in this location. Findings may represent residual tumor and/or post treatment change. Consider continued PET-CT surveillance. No hypermetabolic cervical adenopathy or distant metastatic disease. Chronic findings include: Aortic Atherosclerosis (ICD10-I70.0). Coronary atherosclerosis. Cholelithiasis.  Cannot swallow - all nutrition by PEG. Secretions in throat continue, but improved.  Voice is coming back. No pain.  ALLERGIES:  has No Known Allergies.  Meds: Current Outpatient Medications  Medication Sig Dispense Refill  . aspirin 81 MG tablet Take 81 mg by mouth daily.    Marland Kitchen guaiFENesin-codeine 100-10 MG/5ML syrup Take 5 mLs by mouth every 6 (six) hours as needed for cough. 360 mL 3  . metFORMIN (GLUCOPHAGE) 850 MG tablet Take 850 mg by  mouth 2 (two) times daily with a meal.    . zolpidem (AMBIEN) 5 MG tablet Take 1 tablet (5 mg total) by mouth at bedtime as needed. PRN insomnia. 30 tablet 3  . HYDROcodone-acetaminophen (HYCET) 7.5-325 mg/15 ml solution Take 10 mLs by mouth every 4 (four) hours as needed for moderate pain (or cough). (Patient not taking: Reported on 07/06/2017) 473 mL 0  . lidocaine (XYLOCAINE) 2 % solution Patient: Mix 1part 2% viscous lidocaine, 1part H20. Swallow 13mL of diluted mixture, 87min before meals and at bedtime, up to QID (Patient not taking: Reported on 07/06/2017) 100 mL 5   No current facility-administered medications for this encounter.    Facility-Administered Medications Ordered in Other Encounters  Medication Dose Route Frequency Provider Last Rate Last Dose  . topical emolient (BIAFINE) emulsion   Topical BID Eppie Gibson, MD        Physical Findings: The patient is in no acute distress. Patient is alert and oriented. Wt Readings from Last 3 Encounters:  08/28/17 159 lb 6.4 oz (72.3 kg)  07/06/17 161 lb 6.4 oz (73.2 kg)  04/10/17 173 lb 9.6 oz (78.7 kg)    height is 6\' 2"  (1.88 m) and weight is 159 lb 6.4 oz (72.3 kg). His oral temperature is 98.3 F (36.8 C). His blood pressure is 136/67 and his pulse is 79. His respiration is 20 and oxygen saturation is 96%. .  General: Alert and oriented, in no acute distress HEENT: Head is normocephalic. Extraocular movements are intact. Thick secretions in mouth. Neck:  No palpable masses appreciated. Skin: Skin in treatment fields shows satisfactory healing  Lymphatics: see Neck Exam  PROCEDURE NOTE: After obtaining consent and anesthetizing the nasal cavity with topical lidocaine and phenylephrine, the flexible endoscope was introduced and passed through the nasal cavity.   Majority of epiglottis has regressed and there is now a small raised area of tissue remainly with mucous overlying it. Due to mucous it is difficult to assess the mucosa but  there appears to be some erythema. No other leisions appreciated in the larynx. true cords are symmetrically mobile.    Lab Findings: Lab Results  Component Value Date   WBC 6.5 06/17/2013   HGB 12.6 (L) 06/17/2013   HCT 39.0 06/17/2013   MCV 85.7 06/17/2013   PLT 205 06/17/2013    Lab Results  Component Value Date   TSH 4.244 (H) 02/21/2017    Radiographic Findings: I have personally reviewed his imaging.  Nm Pet Image Restag (ps) Skull Base To Thigh  Result Date: 08/27/2017 CLINICAL DATA:  Subsequent treatment strategy for oropharyngeal cancer of vallecula status post repeat radiation therapy. EXAM: NUCLEAR MEDICINE PET SKULL BASE TO THIGH TECHNIQUE: 7.9 mCi F-18 FDG was injected intravenously. Full-ring PET imaging was performed from the skull base to thigh after the radiotracer. CT data was obtained and used for attenuation correction and anatomic localization. Fasting blood glucose: 105 mg/dl COMPARISON:  02/21/2017 PET-CT. FINDINGS: Mediastinal blood pool activity: SUV max 2.5 NECK: Small focus of hypermetabolism at the base of the tongue and vallecula with max SUV 5.1, previous max SUV 8.3, decreased. Associated soft tissue thickening on the CT images has decreased. No hypermetabolic lymph nodes in the neck. Incidental CT findings: none CHEST: No hypermetabolic axillary, mediastinal or hilar nodes. No hypermetabolic pulmonary findings. Low level uptake associated with sharply marginated small subpleural patchy foci of consolidation in the apical medial upper lobes bilaterally, below the mediastinal blood pool activity, compatible with postradiation change. Incidental CT findings: Coronary atherosclerosis. Atherosclerotic nonaneurysmal thoracic aorta. No lung masses or significant pulmonary nodules. ABDOMEN/PELVIS: No abnormal hypermetabolic activity within the liver, pancreas, adrenal glands, or spleen. No hypermetabolic lymph nodes in the abdomen or pelvis. Incidental CT findings:  Cholelithiasis. Exophytic 1.8 cm renal cortical lesion in the anterior interpolar right kidney with indeterminate density 32 HU (series 4/image 127), not appreciably changed since 10/30/2013, considered benign. Stable curvilinear parenchymal calcification in the lateral left upper kidney. Atherosclerotic nonaneurysmal abdominal aorta. Percutaneous gastrostomy tube is well positioned in the body of the stomach. Mildly enlarged prostate. SKELETON: No focal hypermetabolic activity to suggest skeletal metastasis. Incidental CT findings: none IMPRESSION: 1. Small focus of hypermetabolism at the base of the tongue/vallecula, decreased in metabolism, with associated decreased soft tissue thickening on the CT images in this location. Findings may represent residual tumor and/or post treatment change. Consider continued PET-CT surveillance. 2. No hypermetabolic cervical adenopathy or distant metastatic disease. 3. Chronic findings include: Aortic Atherosclerosis (ICD10-I70.0). Coronary atherosclerosis. Cholelithiasis. Electronically Signed   By: Ilona Sorrel M.D.   On: 08/27/2017 10:04    Impression/Plan:   Head and Neck Cancer Status: Substantial response to re-irradiation  Patient's PET scan and laryngoscopy discussed in detail and shared with him. It is unclear if he has residual tumor but his response to therapy was substantial. He is adament about declining further imaging or follow-up with ENT. He is agreeable to see me back in late August for repeat laryngoscopy.   Also he is adamant about no systemic therapy and no salvage surgery.  I will continue to customize his follow-up and treatment plan according to his values.  Thyroid labs ordered for August.  Pt elects to not follow with SLP for dysphagia.  Noted regression of epiglottic tissue. Use PEG tube. _____________________________________   Eppie Gibson, MD   This document serves as a record of services personally performed by Eppie Gibson, MD. It  was created on her behalf by Margit Banda, a trained medical scribe. The creation of this record is based on the scribe's personal observations and the provider's statements to them. This document has been checked and approved by the attending provider.

## 2017-08-29 ENCOUNTER — Encounter: Payer: Self-pay | Admitting: Radiation Oncology

## 2017-08-29 ENCOUNTER — Telehealth: Payer: Self-pay | Admitting: *Deleted

## 2017-08-29 NOTE — Telephone Encounter (Signed)
Called patient to ask about coming in for labs, lvm for a return call

## 2017-10-02 MED FILL — FLUORISHIELD 1.1% GEL: 1.1 % | 60 days supply | Qty: 228 | Fill #2

## 2017-10-03 NOTE — Progress Notes (Signed)
Brent Franklin presents for follow up of radiation completed 05/28/17 to his Oropharynx.   Pain issues, if any: He denies Using a feeding tube?: Yes, all nutrition.  Weight changes, if any:  Wt Readings from Last 3 Encounters:  10/09/17 159 lb (72.1 kg)  08/28/17 159 lb 6.4 oz (72.3 kg)  07/06/17 161 lb 6.4 oz (73.2 kg)   Swallowing issues, if any: He is unable to swallow anything.  Smoking or chewing tobacco? No Using fluoride trays daily? N/A Last ENT visit was on: N/A Other notable issues, if any:  For repeat Laryngoscopy today.   BP (!) 147/70   Pulse 83   Temp 97.9 F (36.6 C) (Oral)   Ht 6\' 2"  (1.88 m)   Wt 159 lb (72.1 kg)   SpO2 99%   BMI 20.41 kg/m

## 2017-10-04 ENCOUNTER — Ambulatory Visit
Admission: RE | Admit: 2017-10-04 | Discharge: 2017-10-04 | Disposition: A | Discharge status: Home / Self Care | Payer: Medicare Other | Source: Ambulatory Visit | Attending: Radiation Oncology | Admitting: Radiation Oncology

## 2017-10-04 DIAGNOSIS — I890 Lymphedema, not elsewhere classified: Secondary | ICD-10-CM | POA: Diagnosis not present

## 2017-10-04 DIAGNOSIS — R634 Abnormal weight loss: Secondary | ICD-10-CM | POA: Diagnosis not present

## 2017-10-04 DIAGNOSIS — Z1329 Encounter for screening for other suspected endocrine disorder: Secondary | ICD-10-CM | POA: Diagnosis not present

## 2017-10-04 DIAGNOSIS — C01 Malignant neoplasm of base of tongue: Secondary | ICD-10-CM | POA: Diagnosis not present

## 2017-10-04 DIAGNOSIS — Z923 Personal history of irradiation: Secondary | ICD-10-CM | POA: Diagnosis not present

## 2017-10-04 LAB — TSH: TSH: 3.038 u[IU]/mL (ref 0.320–4.118)

## 2017-10-04 LAB — T4, FREE: Free T4: 0.79 ng/dL — ABNORMAL LOW (ref 0.82–1.77)

## 2017-10-09 ENCOUNTER — Other Ambulatory Visit: Payer: Self-pay | Admitting: Radiation Oncology

## 2017-10-09 ENCOUNTER — Ambulatory Visit
Admission: RE | Admit: 2017-10-09 | Discharge: 2017-10-09 | Disposition: A | Discharge status: Home / Self Care | Payer: Medicare Other | Source: Ambulatory Visit | Attending: Radiation Oncology | Admitting: Radiation Oncology

## 2017-10-09 ENCOUNTER — Encounter: Payer: Self-pay | Admitting: Radiation Oncology

## 2017-10-09 ENCOUNTER — Other Ambulatory Visit: Payer: Self-pay

## 2017-10-09 VITALS — BP 147/70 | HR 83 | Temp 97.9°F | Ht 74.0 in | Wt 159.0 lb

## 2017-10-09 DIAGNOSIS — C108 Malignant neoplasm of overlapping sites of oropharynx: Secondary | ICD-10-CM | POA: Diagnosis not present

## 2017-10-09 DIAGNOSIS — Z923 Personal history of irradiation: Secondary | ICD-10-CM | POA: Diagnosis not present

## 2017-10-09 DIAGNOSIS — R1310 Dysphagia, unspecified: Secondary | ICD-10-CM | POA: Diagnosis not present

## 2017-10-09 DIAGNOSIS — R634 Abnormal weight loss: Secondary | ICD-10-CM | POA: Diagnosis not present

## 2017-10-09 DIAGNOSIS — C01 Malignant neoplasm of base of tongue: Secondary | ICD-10-CM | POA: Diagnosis not present

## 2017-10-09 DIAGNOSIS — Z08 Encounter for follow-up examination after completed treatment for malignant neoplasm: Secondary | ICD-10-CM | POA: Diagnosis not present

## 2017-10-09 DIAGNOSIS — Z1329 Encounter for screening for other suspected endocrine disorder: Secondary | ICD-10-CM | POA: Diagnosis not present

## 2017-10-09 DIAGNOSIS — Z9989 Dependence on other enabling machines and devices: Secondary | ICD-10-CM | POA: Diagnosis not present

## 2017-10-09 DIAGNOSIS — Z85810 Personal history of malignant neoplasm of tongue: Secondary | ICD-10-CM | POA: Diagnosis not present

## 2017-10-09 DIAGNOSIS — I890 Lymphedema, not elsewhere classified: Secondary | ICD-10-CM | POA: Diagnosis not present

## 2017-10-09 DIAGNOSIS — C100 Malignant neoplasm of vallecula: Secondary | ICD-10-CM

## 2017-10-09 MED ORDER — ZOLPIDEM TARTRATE 10 MG PO TABS: 10.0000 mg | ORAL_TABLET | Freq: Every evening | ORAL | 3 refills | Status: DC | PRN

## 2017-10-09 MED ORDER — LARYNGOSCOPY SOLUTION RAD-ONC
15.0000 mL | Freq: Once | TOPICAL | Status: AC
  Administered 2017-10-09: 15 mL via TOPICAL
  Filled 2017-10-09: qty 15

## 2017-10-09 NOTE — Progress Notes (Addendum)
Radiation Oncology         [phone] ________________________________  Name: Brent Franklin MRN: [id number]  Date: 10/09/2017  DOB: 1931/08/25  Follow-Up Visit Note  CC: Prince Solian, MD  Olevia Bowens, MD  Diagnosis and Prior Radiotherapy:       ICD-10-CM   1. 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 He is here for repeat laryngoscopy today.          Pain issues, if any: He denies.  Using a feeding tube?: Yes, for all nutrition.   Weight changes, if any:  Wt Readings from Last 3 Encounters:  10/09/17 159 lb (72.1 kg)  08/28/17 159 lb 6.4 oz (72.3 kg)  07/06/17 161 lb 6.4 oz (73.2 kg)   Swallowing issues, if any: He is completely unable to swallow anything. His voice is hoarse.  Smoking or chewing tobacco?: No  Other notable issues, if any: He reports tissue sensitivity in his anterior neck.  ALLERGIES:  has No Known Allergies.  Meds: Current Outpatient Medications  Medication Sig Dispense Refill  . aspirin 81 MG tablet Take 81 mg by mouth daily.    . metFORMIN (GLUCOPHAGE) 850 MG tablet Take 850 mg by mouth 2 (two) times daily with a meal.    . guaiFENesin-codeine 100-10 MG/5ML syrup Take 5 mLs by mouth every 6 (six) hours as needed for cough. (Patient not taking: Reported on 10/09/2017) 360 mL 3  .  HYDROcodone-acetaminophen (HYCET) 7.5-325 mg/15 ml solution Take 10 mLs by mouth every 4 (four) hours as needed for moderate pain (or cough). (Patient not taking: Reported on 07/06/2017) 473 mL 0  . lidocaine (XYLOCAINE) 2 % solution Patient: Mix 1part 2% viscous lidocaine, 1part H20. Swallow 15mL of diluted mixture, 39min before meals and at bedtime, up to QID (Patient not taking: Reported on 07/06/2017) 100 mL 5  . zolpidem (AMBIEN) 10 MG tablet Take 1 tablet (10 mg total) by mouth at bedtime as needed. PRN insomnia. 30 tablet 3   No current facility-administered medications for this encounter.    Facility-Administered Medications Ordered in Other Encounters  Medication Dose Route Frequency Provider Last Rate Last Dose  . topical emolient (BIAFINE) emulsion   Topical BID Eppie Gibson, MD        Physical Findings: Wt Readings from Last 3 Encounters:  10/09/17 159 lb (72.1 kg)  08/28/17 159 lb 6.4 oz (72.3 kg)  07/06/17 161 lb 6.4 oz (73.2 kg)    height is 6\' 2"  (1.88 m) and weight is 159 lb (72.1 kg). His oral temperature is 97.9 F (36.6 C). His blood pressure is 147/70 (abnormal) and his pulse is 83. His oxygen saturation is 99%.    General: Alert and oriented, in no acute distress. HEENT: Head is normocephalic. He has a yellowish coating to his tongue but no obvious thrush and no lesions present in the oral cavity  or upper throat. Neck: Lymphedema present in the anterior neck. No adenopathy appreciated in cervical or SCV regions Neurologic: No obvious focalities. Speech is fluent. Coordination is intact. Psychiatric: Judgment and insight are intact. Affect is appropriate.  PROCEDURE NOTE: After obtaining consent and anesthetizing the nasal cavity with topical lidocaine and phenylephrine, the flexible endoscope was introduced and passed through the nasal cavity.   There is clear relapse of tumor in the prior location of the epiglottis; airway is starting to narrow. From what I can tell,  there is not true vocal cord involvement, and the posterior vocal folds appear to be symmetric with good glottic closure. See attached photo. No other lesions appreciated in the pharynx or larynx. Scope was not passed beyond tumor      Lab Findings: Lab Results  Component Value Date   WBC 6.5 06/17/2013   HGB 12.6 (L) 06/17/2013   HCT 39.0 06/17/2013   MCV 85.7 06/17/2013   PLT 205 06/17/2013    Lab Results  Component Value Date   TSH 3.038 10/04/2017    Radiographic Findings: No results found.  Impression/Plan:   1) Head and Neck Cancer Status: Recurrent disease, relapsing again after reirradiation.  We had a lengthy discussion about the option for palliative systemic therapy, but he has no interest in this and declines a med onc consultation. We also had a lengthy discussion about hospice, but he does not feel ready for this. However, he is open to this in the future. He does understand that if he does not undergo tracheotomy in the next several weeks that his airway may close; I will make a referral to Dr. Wilburn Cornelia via Gayleen Orem, RN, our Head and Neck Oncology Navigator, which the patient is amenable to. The patient has excellent insight and understands that his disease is terminal. He would like to see me back in 6 weeks so that he can stay in the loop with me for oncologic and social support.   2) Lymphedema: Referral to physical therapy.  3) Nutritional Status: Stable weight.  PEG tube: Yes, using for all nutrition.  4) Swallowing: He is unable to swallow anything. Patient elects not to follow up with SLP.  5) Thyroid function: slightly low Free T4 (0.79), may discuss medication at next visit Lab Results  Component Value Date   TSH 3.038 10/04/2017   6) Other: Refilled Ambien today and changed dose from 5 mg to 10 mg, as 5 mg is not sufficient for helping his insomnia. We discussed the risks of increasing to 10 mg, and the patient voiced understanding.   7) Follow-up  in 6 weeks. The patient was encouraged to call with any issues or questions before then.  I spent 40 minutes face to face with the patient and more than 50% of that time was spent in counseling and/or coordination of care. _____________________________________   Eppie Gibson, MD  This document serves as a record of services personally performed by Eppie Gibson, MD. It was created on her behalf by Rae Lips, a trained medical scribe. The creation of this record is based on the scribe's personal observations and the provider's statements to them. This document has been checked and approved by the attending provider.

## 2017-10-10 ENCOUNTER — Other Ambulatory Visit: Payer: Self-pay | Admitting: Radiation Oncology

## 2017-10-10 ENCOUNTER — Telehealth: Payer: Self-pay | Admitting: *Deleted

## 2017-10-10 ENCOUNTER — Encounter: Payer: Self-pay | Admitting: Radiation Oncology

## 2017-10-10 DIAGNOSIS — C100 Malignant neoplasm of vallecula: Secondary | ICD-10-CM

## 2017-10-11 NOTE — Telephone Encounter (Signed)
Oncology Nurse Navigator Documentation  In follow-up to faxed request to [facility] ENT scheduler Alois Cliche for urgent follow-up with Dr. Wilburn Cornelia, rec'd return fax indicating appt scheduled 9/3 0850.  Gayleen Orem, RN, BSN Head & Neck Oncology Nurse [place] [facility] [place] [phone]

## 2017-10-16 DIAGNOSIS — C01 Malignant neoplasm of base of tongue: Secondary | ICD-10-CM | POA: Diagnosis not present

## 2017-10-18 ENCOUNTER — Encounter (HOSPITAL_COMMUNITY): Payer: Self-pay | Admitting: *Deleted

## 2017-10-18 ENCOUNTER — Other Ambulatory Visit: Payer: Self-pay | Admitting: Otolaryngology

## 2017-10-18 ENCOUNTER — Other Ambulatory Visit: Payer: Self-pay

## 2017-10-18 NOTE — Anesthesia Preprocedure Evaluation (Addendum)
Anesthesia Evaluation  Patient identified by MRN, date of birth, ID band Patient awake    Reviewed: Allergy & Precautions, NPO status , Patient's Chart, lab work & pertinent test results  History of Anesthesia Complications (+) DIFFICULT AIRWAY  Airway Mallampati: III  TM Distance: <3 FB Neck ROM: Full   Comment: Indurated immobile subglottic space Dental  (+) Teeth Intact   Pulmonary  Difficult airway due to recurrent Ca base of tongue S/P RT 2015 Hx/o TB +PPD   Pulmonary exam normal breath sounds clear to auscultation       Cardiovascular hypertension, Normal cardiovascular exam Rhythm:Regular Rate:Normal  Not currently on medications   Neuro/Psych eye CVA, Residual Symptoms negative psych ROS   GI/Hepatic Neg liver ROS, Malignant neoplasm base of tongue S/P PEG tube placement-last tube feeding 10pm Dysphagia- unable to swallow saliva   Endo/Other  diabetes, Poorly Controlled, Type 2Malnutrition Not currently on Rx   Renal/GU negative Renal ROS  negative genitourinary   Musculoskeletal negative musculoskeletal ROS (+)   Abdominal   Peds  Hematology negative hematology ROS (+)   Anesthesia Other Findings   Reproductive/Obstetrics                           Anesthesia Physical Anesthesia Plan  ASA: III  Anesthesia Plan: General   Post-op Pain Management:    Induction: Intravenous  PONV Risk Score and Plan: 4 or greater and Ondansetron, Treatment may vary due to age or medical condition and Dexamethasone  Airway Management Planned: Oral ETT, Video Laryngoscope Planned and Fiberoptic Intubation Planned  Additional Equipment:   Intra-op Plan:   Post-operative Plan: Possible Post-op intubation/ventilation  Informed Consent: I have reviewed the patients History and Physical, chart, labs and discussed the procedure including the risks, benefits and alternatives for the proposed  anesthesia with the patient or authorized representative who has indicated his/her understanding and acceptance.   Dental advisory given  Plan Discussed with: CRNA and Surgeon  Anesthesia Plan Comments:        Anesthesia Quick Evaluation

## 2017-10-18 NOTE — Progress Notes (Signed)
Pt denies SOB, chest pain, and being under the care of a cardiologist. Pt denies having a stres test, echo and cardiac cath. Pt denies having an EKG and chest x ray within the last year. Pt denies recent labs (denies A1c in the last 60 days) . Pt made aware to not take Metformin on DOS. Pt stated that last dose of Aspirin was Wednesday. Pt made aware to stop taking vitamins, fish oil, and herbal medications. Do not take any NSAIDs ie: Ibuprofen, Advil, Naproxen ( Aleve), Motrin, BC and Goody Powder. Pt verbalized understanding of all pre-op instructions. Anesthesia made aware of pt pharyngeal tumor (difficult intubation).

## 2017-10-18 NOTE — Progress Notes (Signed)
Anesthesia Chart Review: SAME DAY WORKUP   Case:  [id number] Date/Time:  10/19/17 0715   Procedure:  TRACHEOSTOMY (N/A )   Anesthesia type:  General   Pre-op diagnosis:  airway managment   Location:  [place] OR ROOM 08 / [place] OR   Surgeon:  Jerrell Belfast, MD      DISCUSSION: 84 yo male never smoker for above procedure. Pertinent hx includes HTN, Stroke (2001), Pharyngeal cancer s/p radiation with recurrence, DMII, Uses feeding tube, +PPD (no treatment).  Pt may be difficult intubation due to presence of tumor at vallecula/epiglottis. Below is a picture copied from Dr. Dionisio David note on 10/09/2017 showing location of the tumor:   Anticipate he can proceed with surgery as planned barring acute status change.  VS: There were no vitals taken for this visit.  PROVIDERS: Prince Solian, MD is PCP  Eppie Gibson, MD is Radiation Oncologist   LABS: Will need DOS labs  Labs Reviewed - No data to display   IMAGES: N/A   EKG: Will need DOS   CV: N/A  Past Medical History:  Diagnosis Date  . Cataract   . Diabetes mellitus without complication ([place])   . 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 Hyperlipidemia   . Hypertension   . Inguinal hernia   . Insomnia 05/06/2013  . Rash 05/14/2013  . S/P radiation therapy 05/07/13- 06/20/13   Base of tongue/bilat neck 66 Gy, 33 fx  . S/P radiation therapy 04/16/17- 05/28/17   Oropharynx, 2 Gy in 30 fractions for a total dose of 60 Gy  . Stroke [facility]) 2001   eye  . Tongue cancer ([place]) 04/18/2013   pharyngeal   . Tuberculosis    + PPD NO [place]   . Uses feeding tube     Past Surgical History:  Procedure Laterality Date  . EYE SURGERY  2000, 2001   cataract  . INGUINAL HERNIA REPAIR  02/13/2012   Procedure: HERNIA REPAIR INGUINAL ADULT;  Surgeon: Adin Hector, MD;  Location: [place];  Service: General;  Laterality: Right;  . INSERTION OF MESH  02/13/2012   Procedure: INSERTION OF MESH;  Surgeon: Adin Hector, MD;   Location: [place];  Service: General;  Laterality: N/A;  . LAPAROSCOPIC GASTROSTOMY N/A 05/05/2013   Procedure: LAPAROSCOPIC GASTROSTOMY;  Surgeon: Adin Hector, MD;  Location: [place];  Service: General;  Laterality: N/A;  . PORTACATH PLACEMENT N/A 05/05/2013   Procedure: INSERTION PORT-A-CATH;  Surgeon: Adin Hector, MD;  Location: [place];  Service: General;  Laterality: N/A;    MEDICATIONS: No current facility-administered medications for this encounter.    Marland Kitchen acetaminophen (TYLENOL) 500 MG tablet  . aspirin 81 MG tablet  . metFORMIN (GLUCOPHAGE) 850 MG tablet  . zolpidem (AMBIEN) 10 MG tablet  . guaiFENesin-codeine 100-10 MG/5ML syrup  . HYDROcodone-acetaminophen (HYCET) 7.5-325 mg/15 ml solution  . lidocaine (XYLOCAINE) 2 % solution   . topical emolient (BIAFINE) emulsion    Wynonia Musty [facility] Short Stay Center/Anesthesiology Phone [phone] 10/18/2017 9:44 AM

## 2017-10-19 ENCOUNTER — Inpatient Hospital Stay (HOSPITAL_COMMUNITY): Payer: Medicare Other | Admitting: Physician Assistant

## 2017-10-19 ENCOUNTER — Inpatient Hospital Stay (HOSPITAL_COMMUNITY)
Admission: RE | Admit: 2017-10-19 | Discharge: 2017-10-23 | DRG: 013 | Disposition: A | Discharge status: Home / Self Care | Payer: Medicare Other | Source: Ambulatory Visit | Attending: Otolaryngology | Admitting: Otolaryngology

## 2017-10-19 ENCOUNTER — Other Ambulatory Visit: Payer: Self-pay | Admitting: Radiation Oncology

## 2017-10-19 ENCOUNTER — Telehealth: Payer: Self-pay | Admitting: *Deleted

## 2017-10-19 ENCOUNTER — Encounter (HOSPITAL_COMMUNITY): Payer: Self-pay | Admitting: Surgery

## 2017-10-19 ENCOUNTER — Other Ambulatory Visit: Payer: Self-pay

## 2017-10-19 ENCOUNTER — Encounter (HOSPITAL_COMMUNITY)
Admission: RE | Disposition: A | Discharge status: Home / Self Care | Payer: Self-pay | Source: Ambulatory Visit | Attending: Otolaryngology

## 2017-10-19 DIAGNOSIS — C329 Malignant neoplasm of larynx, unspecified: Secondary | ICD-10-CM | POA: Diagnosis not present

## 2017-10-19 DIAGNOSIS — Z7982 Long term (current) use of aspirin: Secondary | ICD-10-CM | POA: Diagnosis not present

## 2017-10-19 DIAGNOSIS — Z931 Gastrostomy status: Secondary | ICD-10-CM

## 2017-10-19 DIAGNOSIS — Z8249 Family history of ischemic heart disease and other diseases of the circulatory system: Secondary | ICD-10-CM

## 2017-10-19 DIAGNOSIS — Z7984 Long term (current) use of oral hypoglycemic drugs: Secondary | ICD-10-CM | POA: Diagnosis not present

## 2017-10-19 DIAGNOSIS — C321 Malignant neoplasm of supraglottis: Principal | ICD-10-CM | POA: Diagnosis present

## 2017-10-19 DIAGNOSIS — E119 Type 2 diabetes mellitus without complications: Secondary | ICD-10-CM | POA: Diagnosis present

## 2017-10-19 DIAGNOSIS — I639 Cerebral infarction, unspecified: Secondary | ICD-10-CM | POA: Diagnosis not present

## 2017-10-19 DIAGNOSIS — I10 Essential (primary) hypertension: Secondary | ICD-10-CM | POA: Diagnosis present

## 2017-10-19 DIAGNOSIS — C01 Malignant neoplasm of base of tongue: Secondary | ICD-10-CM | POA: Diagnosis present

## 2017-10-19 DIAGNOSIS — Z930 Tracheostomy status: Secondary | ICD-10-CM | POA: Diagnosis not present

## 2017-10-19 DIAGNOSIS — Z923 Personal history of irradiation: Secondary | ICD-10-CM

## 2017-10-19 DIAGNOSIS — Z85810 Personal history of malignant neoplasm of tongue: Secondary | ICD-10-CM | POA: Diagnosis not present

## 2017-10-19 DIAGNOSIS — Z8042 Family history of malignant neoplasm of prostate: Secondary | ICD-10-CM

## 2017-10-19 DIAGNOSIS — J988 Other specified respiratory disorders: Secondary | ICD-10-CM | POA: Diagnosis present

## 2017-10-19 DIAGNOSIS — Z8673 Personal history of transient ischemic attack (TIA), and cerebral infarction without residual deficits: Secondary | ICD-10-CM

## 2017-10-19 DIAGNOSIS — E785 Hyperlipidemia, unspecified: Secondary | ICD-10-CM | POA: Diagnosis present

## 2017-10-19 DIAGNOSIS — Z823 Family history of stroke: Secondary | ICD-10-CM | POA: Diagnosis not present

## 2017-10-19 DIAGNOSIS — Z803 Family history of malignant neoplasm of breast: Secondary | ICD-10-CM

## 2017-10-19 DIAGNOSIS — C029 Malignant neoplasm of tongue, unspecified: Secondary | ICD-10-CM | POA: Diagnosis not present

## 2017-10-19 DIAGNOSIS — Z430 Encounter for attention to tracheostomy: Secondary | ICD-10-CM | POA: Diagnosis not present

## 2017-10-19 HISTORY — PX: TRACHEOSTOMY TUBE PLACEMENT: SHX814

## 2017-10-19 HISTORY — DX: Presence of other specified devices: Z97.8

## 2017-10-19 HISTORY — DX: Failed or difficult intubation, initial encounter: T88.4XXA

## 2017-10-19 LAB — CBC
HCT: 39.4 % (ref 39.0–52.0)
Hemoglobin: 12.4 g/dL — ABNORMAL LOW (ref 13.0–17.0)
MCH: 30.3 pg (ref 26.0–34.0)
MCHC: 31.5 g/dL (ref 30.0–36.0)
MCV: 96.3 fL (ref 78.0–100.0)
PLATELETS: 187 10*3/uL (ref 150–400)
RBC: 4.09 MIL/uL — ABNORMAL LOW (ref 4.22–5.81)
RDW: 13 % (ref 11.5–15.5)
WBC: 6.6 10*3/uL (ref 4.0–10.5)

## 2017-10-19 LAB — GLUCOSE, CAPILLARY
GLUCOSE-CAPILLARY: 253 mg/dL — AB (ref 70–99)
Glucose-Capillary: 107 mg/dL — ABNORMAL HIGH (ref 70–99)
Glucose-Capillary: 168 mg/dL — ABNORMAL HIGH (ref 70–99)
Glucose-Capillary: 200 mg/dL — ABNORMAL HIGH (ref 70–99)

## 2017-10-19 LAB — BASIC METABOLIC PANEL
Anion gap: 10 (ref 5–15)
BUN: 29 mg/dL — AB (ref 8–23)
CO2: 30 mmol/L (ref 22–32)
CREATININE: 0.77 mg/dL (ref 0.61–1.24)
Calcium: 9.1 mg/dL (ref 8.9–10.3)
Chloride: 100 mmol/L (ref 98–111)
GFR calc Af Amer: >60 mL/min (ref >60)
Glucose, Bld: 119 mg/dL — ABNORMAL HIGH (ref 70–99)
Potassium: 4.1 mmol/L (ref 3.5–5.1)
Sodium: 140 mmol/L (ref 135–145)

## 2017-10-19 LAB — HEMOGLOBIN A1C
HEMOGLOBIN A1C: 6.3 % — AB (ref 4.8–5.6)
MEAN PLASMA GLUCOSE: 134.11 mg/dL

## 2017-10-19 LAB — MRSA PCR SCREENING: MRSA by PCR: NEGATIVE

## 2017-10-19 SURGERY — CREATION, TRACHEOSTOMY
Anesthesia: General

## 2017-10-19 MED ORDER — ACETAMINOPHEN 500 MG PO TABS: 1000.0000 mg | ORAL_TABLET | Freq: Four times a day (QID) | ORAL | Status: DC | PRN

## 2017-10-19 MED ORDER — SUCCINYLCHOLINE CHLORIDE 20 MG/ML IJ SOLN
INTRAMUSCULAR | Status: DC | PRN
  Administered 2017-10-19: 100 mg via INTRAVENOUS

## 2017-10-19 MED ORDER — ACETAMINOPHEN 160 MG/5ML PO SOLN
650.0000 mg | ORAL | Status: DC | PRN
  Administered 2017-10-19 – 2017-10-23 (×11): 650 mg via ORAL
  Filled 2017-10-19 (×12): qty 20.3

## 2017-10-19 MED ORDER — FENTANYL CITRATE (PF) 100 MCG/2ML IJ SOLN
INTRAMUSCULAR | Status: DC | PRN
  Administered 2017-10-19: 100 ug via INTRAVENOUS

## 2017-10-19 MED ORDER — ONDANSETRON HCL 4 MG/2ML IJ SOLN: 4.0000 mg | Freq: Once | INTRAMUSCULAR | Status: DC | PRN

## 2017-10-19 MED ORDER — METFORMIN HCL 850 MG PO TABS
850.0000 mg | ORAL_TABLET | Freq: Two times a day (BID) | ORAL | Status: DC
  Administered 2017-10-19 – 2017-10-23 (×8): 850 mg via ORAL
  Filled 2017-10-19 (×11): qty 1

## 2017-10-19 MED ORDER — MEPERIDINE HCL 50 MG/ML IJ SOLN: 6.2500 mg | INTRAMUSCULAR | Status: DC | PRN

## 2017-10-19 MED ORDER — ONDANSETRON HCL 4 MG/2ML IJ SOLN
INTRAMUSCULAR | Status: DC | PRN
  Administered 2017-10-19: 4 mg via INTRAVENOUS

## 2017-10-19 MED ORDER — PROPOFOL 10 MG/ML IV BOLUS
INTRAVENOUS | Status: AC
  Filled 2017-10-19: qty 20

## 2017-10-19 MED ORDER — DEXAMETHASONE SODIUM PHOSPHATE 10 MG/ML IJ SOLN
INTRAMUSCULAR | Status: DC | PRN
  Administered 2017-10-19: 10 mg via INTRAVENOUS

## 2017-10-19 MED ORDER — LIDOCAINE-EPINEPHRINE 1 %-1:100000 IJ SOLN
INTRAMUSCULAR | Status: AC
  Filled 2017-10-19: qty 1

## 2017-10-19 MED ORDER — LIDOCAINE-EPINEPHRINE 1 %-1:100000 IJ SOLN
INTRAMUSCULAR | Status: DC | PRN
  Administered 2017-10-19: 4 mL

## 2017-10-19 MED ORDER — DEXTROSE IN LACTATED RINGERS 5 % IV SOLN: INTRAVENOUS | Status: DC

## 2017-10-19 MED ORDER — HEPARIN SODIUM (PORCINE) 5000 UNIT/ML IJ SOLN
5000.0000 [IU] | Freq: Three times a day (TID) | INTRAMUSCULAR | Status: DC
  Filled 2017-10-19 (×5): qty 1

## 2017-10-19 MED ORDER — FENTANYL CITRATE (PF) 100 MCG/2ML IJ SOLN
25.0000 ug | INTRAMUSCULAR | Status: DC | PRN
  Administered 2017-10-19: 25 ug via INTRAVENOUS

## 2017-10-19 MED ORDER — DEXAMETHASONE SODIUM PHOSPHATE 10 MG/ML IJ SOLN
INTRAMUSCULAR | Status: AC
  Filled 2017-10-19: qty 1

## 2017-10-19 MED ORDER — ZOLPIDEM TARTRATE 5 MG PO TABS
5.0000 mg | ORAL_TABLET | Freq: Every evening | ORAL | Status: DC | PRN
  Administered 2017-10-21 – 2017-10-22 (×2): 5 mg via ORAL
  Filled 2017-10-19 (×2): qty 1

## 2017-10-19 MED ORDER — ACETAMINOPHEN 650 MG RE SUPP
650.0000 mg | RECTAL | Status: DC | PRN
  Filled 2017-10-19: qty 1

## 2017-10-19 MED ORDER — ONDANSETRON HCL 4 MG PO TABS: 4.0000 mg | ORAL_TABLET | ORAL | Status: DC | PRN

## 2017-10-19 MED ORDER — PROPOFOL 10 MG/ML IV BOLUS
INTRAVENOUS | Status: DC | PRN
  Administered 2017-10-19: 100 mg via INTRAVENOUS
  Administered 2017-10-19: 20 mg via INTRAVENOUS
  Administered 2017-10-19: 30 mg via INTRAVENOUS

## 2017-10-19 MED ORDER — HYDROCODONE-ACETAMINOPHEN 7.5-325 MG/15ML PO SOLN: 10.0000 mL | ORAL | Status: DC | PRN

## 2017-10-19 MED ORDER — ONDANSETRON HCL 4 MG/2ML IJ SOLN
INTRAMUSCULAR | Status: AC
  Filled 2017-10-19: qty 2

## 2017-10-19 MED ORDER — ASPIRIN 81 MG PO CHEW
81.0000 mg | CHEWABLE_TABLET | Freq: Every day | ORAL | Status: DC
  Administered 2017-10-19 – 2017-10-23 (×4): 81 mg via ORAL
  Filled 2017-10-19 (×6): qty 1

## 2017-10-19 MED ORDER — FENTANYL CITRATE (PF) 250 MCG/5ML IJ SOLN
INTRAMUSCULAR | Status: AC
  Filled 2017-10-19: qty 5

## 2017-10-19 MED ORDER — ONDANSETRON HCL 4 MG/2ML IJ SOLN: 4.0000 mg | INTRAMUSCULAR | Status: DC | PRN

## 2017-10-19 MED ORDER — CEFAZOLIN SODIUM-DEXTROSE 2-4 GM/100ML-% IV SOLN
INTRAVENOUS | Status: AC
  Filled 2017-10-19: qty 100

## 2017-10-19 MED ORDER — FENTANYL CITRATE (PF) 100 MCG/2ML IJ SOLN
INTRAMUSCULAR | Status: AC
  Filled 2017-10-19: qty 2

## 2017-10-19 MED ORDER — ORAL CARE MOUTH RINSE
15.0000 mL | Freq: Two times a day (BID) | OROMUCOSAL | Status: DC
  Administered 2017-10-22 (×2): 15 mL via OROMUCOSAL

## 2017-10-19 MED ORDER — CEFAZOLIN SODIUM-DEXTROSE 2-3 GM-%(50ML) IV SOLR
INTRAVENOUS | Status: DC | PRN
  Administered 2017-10-19: 2 g via INTRAVENOUS

## 2017-10-19 MED ORDER — LIDOCAINE 2% (20 MG/ML) 5 ML SYRINGE
INTRAMUSCULAR | Status: DC | PRN
  Administered 2017-10-19: 50 mg via INTRAVENOUS

## 2017-10-19 MED ORDER — CHLORHEXIDINE GLUCONATE 0.12 % MT SOLN
15.0000 mL | Freq: Two times a day (BID) | OROMUCOSAL | Status: DC
  Filled 2017-10-19 (×5): qty 15

## 2017-10-19 MED ORDER — LIDOCAINE VISCOUS 2 % MT SOLN: 15.0000 mL | OROMUCOSAL | Status: DC | PRN

## 2017-10-19 MED ORDER — LACTATED RINGERS IV SOLN
INTRAVENOUS | Status: DC | PRN
  Administered 2017-10-19: 08:00:00 via INTRAVENOUS

## 2017-10-19 SURGICAL SUPPLY — 38 items
BLADE CLIPPER SURG (BLADE) IMPLANT
BLADE SURG 15 STRL LF DISP TIS (BLADE) ×2 IMPLANT
BLADE SURG 15 STRL SS (BLADE) ×4
CANISTER SUCT 3000ML PPV (MISCELLANEOUS) ×3 IMPLANT
CLEANER TIP ELECTROSURG 2X2 (MISCELLANEOUS) ×3 IMPLANT
CORD BIPOLAR FORCEPS 12FT (ELECTRODE) ×3 IMPLANT
COVER SURGICAL LIGHT HANDLE (MISCELLANEOUS) ×3 IMPLANT
DRAPE HALF SHEET 40X57 (DRAPES) IMPLANT
ELECT COATED BLADE 2.86 ST (ELECTRODE) ×3 IMPLANT
ELECT REM PT RETURN 9FT ADLT (ELECTROSURGICAL) ×3
ELECTRODE REM PT RTRN 9FT ADLT (ELECTROSURGICAL) ×1 IMPLANT
FORCEPS BIPOLAR SPETZLER 8 1.0 (NEUROSURGERY SUPPLIES) ×3 IMPLANT
GAUZE 4X4 16PLY RFD (DISPOSABLE) ×3 IMPLANT
GAUZE XEROFORM 5X9 LF (GAUZE/BANDAGES/DRESSINGS) ×3 IMPLANT
GLOVE BIOGEL M 7.0 STRL (GLOVE) ×6 IMPLANT
GOWN STRL REUS W/ TWL LRG LVL3 (GOWN DISPOSABLE) ×1 IMPLANT
GOWN STRL REUS W/TWL LRG LVL3 (GOWN DISPOSABLE) ×2
HOLDER TRACH TUBE VELCRO 19.5 (MISCELLANEOUS) IMPLANT
KIT BASIN OR (CUSTOM PROCEDURE TRAY) ×3 IMPLANT
KIT SUCTION CATH 14FR (SUCTIONS) IMPLANT
KIT TURNOVER KIT B (KITS) ×3 IMPLANT
NEEDLE HYPO 25GX1X1/2 BEV (NEEDLE) ×3 IMPLANT
NS IRRIG 1000ML POUR BTL (IV SOLUTION) ×3 IMPLANT
PACK EENT II TURBAN DRAPE (CUSTOM PROCEDURE TRAY) ×3 IMPLANT
PAD ARMBOARD 7.5X6 YLW CONV (MISCELLANEOUS) ×6 IMPLANT
PENCIL BUTTON HOLSTER BLD 10FT (ELECTRODE) ×3 IMPLANT
SPONGE DRAIN TRACH 4X4 STRL 2S (GAUZE/BANDAGES/DRESSINGS) ×3 IMPLANT
SPONGE INTESTINAL PEANUT (DISPOSABLE) ×3 IMPLANT
SUT CHROMIC 2 0 SH (SUTURE) ×6 IMPLANT
SUT ETHILON 2 0 FS 18 (SUTURE) ×6 IMPLANT
SUT SILK 2 0 PERMA HAND 18 BK (SUTURE) ×3 IMPLANT
SUT SILK 3 0 REEL (SUTURE) ×3 IMPLANT
SYR 20CC LL (SYRINGE) ×3 IMPLANT
SYR BULB IRRIGATION 50ML (SYRINGE) IMPLANT
SYR CONTROL 10ML LL (SYRINGE) ×3 IMPLANT
TUBE CONNECTING 12'X1/4 (SUCTIONS) ×1
TUBE CONNECTING 12X1/4 (SUCTIONS) ×2 IMPLANT
WATER STERILE IRR 1000ML POUR (IV SOLUTION) ×3 IMPLANT

## 2017-10-19 NOTE — H&P (Signed)
Brent Franklin is an 84 y.o. male.   Chief Complaint: Airway difficulty HPI: Progressive respiratory difficulty secondary to supraglottic tumor  Past Medical History:  Diagnosis Date  . Cataract   . Diabetes mellitus without complication ([place])   . 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 Hyperlipidemia   . Hypertension   . Inguinal hernia   . Insomnia 05/06/2013  . Rash 05/14/2013  . S/P radiation therapy 05/07/13- 06/20/13   Base of tongue/bilat neck 66 Gy, 33 fx  . S/P radiation therapy 04/16/17- 05/28/17   Oropharynx, 2 Gy in 30 fractions for a total dose of 60 Gy  . Stroke [facility]) 2001   eye  . 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 Tuberculosis    + PPD NO TX   . Uses feeding tube     Past Surgical History:  Procedure Laterality Date  . EYE SURGERY  2000, 2001   cataract  . INGUINAL HERNIA REPAIR  02/13/2012   Procedure: HERNIA REPAIR INGUINAL ADULT;  Surgeon: Adin Hector, MD;  Location: [place];  Service: General;  Laterality: Right;  . INSERTION OF MESH  02/13/2012   Procedure: INSERTION OF MESH;  Surgeon: Adin Hector, MD;  Location: [place];  Service: General;  Laterality: N/A;  . LAPAROSCOPIC GASTROSTOMY N/A 05/05/2013   Procedure: LAPAROSCOPIC GASTROSTOMY;  Surgeon: Adin Hector, MD;  Location: [place];  Service: General;  Laterality: N/A;  . PORTACATH PLACEMENT N/A 05/05/2013   Procedure: INSERTION PORT-A-CATH;  Surgeon: Adin Hector, MD;  Location: [place];  Service: General;  Laterality: N/A;    Family History  Problem Relation Age of Onset  . Cancer Mother 92       breast ca  . Stroke Father   . Cancer Brother 55       prostate ca  . Heart disease Brother    Social History:  reports that he has never smoked. He has never used smokeless tobacco. He reports that he drank alcohol. He reports that he does not use drugs.  Allergies: No Known Allergies  Medications Prior to Admission  Medication Sig Dispense Refill  .  acetaminophen (TYLENOL) 500 MG tablet Take 1,000 mg by mouth every 6 (six) hours as needed for moderate pain or headache.    Marland Kitchen aspirin 81 MG tablet Take 81 mg by mouth daily.    . metFORMIN (GLUCOPHAGE) 850 MG tablet Take 850 mg by mouth 2 (two) times daily with a meal.    . zolpidem (AMBIEN) 10 MG tablet Take 1 tablet (10 mg total) by mouth at bedtime as needed. PRN insomnia. (Patient taking differently: Take 10 mg by mouth at bedtime as needed for sleep. ) 30 tablet 3  . guaiFENesin-codeine 100-10 MG/5ML syrup Take 5 mLs by mouth every 6 (six) hours as needed for cough. (Patient not taking: Reported on 10/09/2017) 360 mL 3  . HYDROcodone-acetaminophen (HYCET) 7.5-325 mg/15 ml solution Take 10 mLs by mouth every 4 (four) hours as needed for moderate pain (or cough). (Patient not taking: Reported on 07/06/2017) 473 mL 0  . lidocaine (XYLOCAINE) 2 % solution Patient: Mix 1part 2% viscous lidocaine, 1part H20. Swallow 13m of diluted mixture, 359m before meals and at bedtime, up to QID (Patient not taking: Reported on 07/06/2017) 100 mL 5    Results for orders placed or performed during the hospital encounter of 10/19/17 (from the past 48 hour(s))  Glucose, capillary  Status: Abnormal   Collection Time: 10/19/17  6:05 AM  Result Value Ref Range   Glucose-Capillary 107 (H) 70 - 99 mg/dL   Comment 1 Notify RN    Comment 2 Document in Chart   Basic metabolic panel     Status: Abnormal   Collection Time: 10/19/17  6:23 AM  Result Value Ref Range   Sodium 140 135 - 145 mmol/L   Potassium 4.1 3.5 - 5.1 mmol/L   Chloride 100 98 - 111 mmol/L   CO2 30 22 - 32 mmol/L   Glucose, Bld 119 (H) 70 - 99 mg/dL   BUN 29 (H) 8 - 23 mg/dL   Creatinine, Ser 0.77 0.61 - 1.24 mg/dL   Calcium 9.1 8.9 - 10.3 mg/dL   GFR calc non Af Amer >60 >60 mL/min   GFR calc Af Amer >60 >60 mL/min    Comment: (NOTE) The eGFR has been calculated using the CKD EPI equation. This calculation has not been validated in all  clinical situations. eGFR's persistently <60 mL/min signify possible Chronic Kidney Disease.    Anion gap 10 5 - 15    Comment: Performed at [facility] [address] [place], [place] [address]  Hemoglobin A1c     Status: Abnormal   Collection Time: 10/19/17  6:23 AM  Result Value Ref Range   Hgb A1c MFr Bld 6.3 (H) 4.8 - 5.6 %    Comment: (NOTE) Pre diabetes:          5.7%-6.4% Diabetes:              >6.4% Glycemic control for   <7.0% adults with diabetes    Mean Plasma Glucose 134.11 mg/dL    Comment: Performed at [facility] [address] [place], [place] [address]  CBC     Status: Abnormal   Collection Time: 10/19/17  6:23 AM  Result Value Ref Range   WBC 6.6 4.0 - 10.5 K/uL   RBC 4.09 (L) 4.22 - 5.81 MIL/uL   Hemoglobin 12.4 (L) 13.0 - 17.0 g/dL   HCT 39.4 39.0 - 52.0 %   MCV 96.3 78.0 - 100.0 fL   MCH 30.3 26.0 - 34.0 pg   MCHC 31.5 30.0 - 36.0 g/dL   RDW 13.0 11.5 - 15.5 %   Platelets 187 150 - 400 K/uL    Comment: Performed at [facility], [place] [address] [place], [place] [address]   No results found.  Review of Systems  Constitutional: Negative.   Respiratory: Positive for stridor.   Cardiovascular: Negative.     Blood pressure (!) 165/72, pulse 72, temperature 98.4 F (36.9 C), temperature source Oral, resp. rate 18, weight 72.6 kg, SpO2 97 %. Physical Exam  Constitutional: He appears well-developed and well-nourished.  HENT:  Exophytic Supraglottic tumor  Neck: Normal range of motion. Neck supple.  Cardiovascular: Normal rate.  Respiratory: Effort normal.     Assessment/Plan Adm for elective tracheostomy.  River Mckercher, MD 10/19/2017, 7:35 AM

## 2017-10-19 NOTE — Anesthesia Postprocedure Evaluation (Signed)
Anesthesia Post Note  Patient: Brent Franklin  Procedure(s) Performed: TRACHEOSTOMY (N/A )     Patient location during evaluation: PACU Anesthesia Type: General Level of consciousness: awake and alert and oriented Pain management: pain level controlled Vital Signs Assessment: post-procedure vital signs reviewed and stable Respiratory status: spontaneous breathing, nonlabored ventilation, respiratory function stable and patient connected to tracheostomy mask oxygen Cardiovascular status: blood pressure returned to baseline and stable Postop Assessment: no apparent nausea or vomiting Anesthetic complications: no    Last Vitals:  Vitals:   10/19/17 1145 10/19/17 1155  BP: (!) 159/79 (!) 152/75  Pulse: 88 83  Resp: 14 11  Temp: 36.5 C   SpO2: 100% 100%    Last Pain:  Vitals:   10/19/17 1145  TempSrc:   PainSc: 0-No pain                 Khaiden Segreto A.

## 2017-10-19 NOTE — Transfer of Care (Signed)
Immediate Anesthesia Transfer of Care Note  Patient: Brent Franklin  Procedure(s) Performed: TRACHEOSTOMY (N/A )  Patient Location: PACU  Anesthesia Type:General  Level of Consciousness: awake, alert  and oriented  Airway & Oxygen Therapy: Patient Spontanous Breathing and Patient connected to tracheostomy mask oxygen  Post-op Assessment: Report given to RN, Post -op Vital signs reviewed and stable and Patient moving all extremities X 4  Post vital signs: Reviewed and stable  Last Vitals:  Vitals Value Taken Time  BP 153/85 10/19/2017  8:43 AM  Temp 36.4 C 10/19/2017  8:41 AM  Pulse 92 10/19/2017  8:48 AM  Resp 20 10/19/2017  8:48 AM  SpO2 100 % 10/19/2017  8:48 AM  Vitals shown include unvalidated device data.  Last Pain:  Vitals:   10/19/17 0612  TempSrc:   PainSc: 0-No pain      Patients Stated Pain Goal: 3 (25/00/37 0488)  Complications: No apparent anesthesia complications

## 2017-10-19 NOTE — Op Note (Signed)
Operative Note: TRACHEOSTOMY  Patient: [place] record number: [id number]  Date:10/19/2017  Pre-operative Indications: 1.  Airway compromise     2.  Recurrent supraglottic squamous cell carcinoma  Postoperative Indications: Same  Surgical Procedure: Tracheostomy  Anesthesia: GET  Surgeon: Delsa Bern, M.D.  Assist: Dr. Melida Quitter  Complications: None  EBL: Minimal   Brief History: The patient is a 84 y.o. 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  The patient had local regional recurrence with extension of the tumor to the supraglottis.  He was treated with a second round of radiation therapy.  The patient continued to have cancer in the superior airway with increasing symptoms of airway compromise and difficulty handling secretions. Given the patient's history and findings I recommended elective tracheostomy under general anesthesia, risks and benefits were discussed in detail with the patient and their family. They understand and agree with our plan for surgery which is scheduled at [facility] on an active basis.  Surgical Procedure: The patient is brought to the operating room on 10/19/2017 and placed in supine position on the operating table.  A surgical timeout was performed and correct identification of the patient and the surgical procedure.  The patient was injected with 2 cc of 1% lidocaine 1:100,000 dilution epinephrine, which was injected in the anterior neck skin at the proposed incision.  The patient was positioned and prepped and draped in sterile fashion.  We elected to attempt oral tracheal intubation and consultation with the anesthesia service.  The patient's airway was visualized directly with a glide scope followed by a flexible endoscopic exam.  Unfortunately given the patient's airway anatomy, recurrent tumor and previous radiation therapy we were  unable to adequately visualize his airway and intubate him.  The patient was safely mask ventilated throughout the subsequent tracheostomy procedure.  There were no significant oxygen desaturations, bradycardia or airway concerns.  A horizontal skin incision was created in a pre-existing skin crease, this was carried through the skin underlying subcutaneous tissue with a #15 scalpel.  Bovie electrocautery was then used to dissect the subcutaneous tissue and a small amount of subcutaneous fat was removed.  The strap muscles were identified and lateralized.  The anterior compartment of the neck was visualized in the patient's anterior trachea was palpated.  The thyroid isthmus was identified and divided with Bovie electrocautery.  This allowed direct access to the anterior trachea.  A horizontally oriented tracheotomy incision was created at approximately the second tracheal interspace.  The trachea was suctioned, endotracheal tube withdrawn under direct visualization.  A #4 uncuffed Shiley tracheostomy tube was inserted without difficulty and the patient's airway was stabilized.  The patient had good gas exchange, no active bleeding.  The tracheostomy tube was sutured into position with 3-0 Ethilon sutures, a Velcro trach tie was then placed.   Patient was awakened from anesthetic and transferred from the operating room to recovery room in stable condition. There were no complications and blood loss was minimal.   Delsa Bern, M.D. [facility] 10/19/2017

## 2017-10-19 NOTE — Progress Notes (Signed)
ENT Post Operative Note  Subjective: No nausea, no vomiting No difficulty voiding Pain well controlled  Fed himself via G tube  Vitals:   10/19/17 1610 10/19/17 1617  BP: 125/69 131/70  Pulse:  (!) 105  Resp:  17  Temp:    SpO2:  98%     OBJECTIVE  Gen: alert, cooperative, appropriate Head/ENT: EOMI, neck supple, mucus membranes moist and pink, conjunctiva clear Incisions c/d/i  Face moves symmetrically Respiratory: Voice without dysphonia. non-labored breathing, no accessory muscle use, normal HR, good O2 saturations 4 CFS trach in place, minor serosanguinous drainage in and around trach  ASSESS/ PLAN  Brent Franklin is a 84 y.o. male who is POD 0 from tracheotomy (awake).  -pain control -Keep Xeroform in place -Keep trach collar tight -Humidified air at all times please -Suction PRN -Will cap/ PMV tomorrow -To ICU tonight, floor likely tomorrow -Patient may perform his own G tube feeding and hydration per home routine. Please SLIV when tolerating all feeds and hydration via G tube.

## 2017-10-19 NOTE — Telephone Encounter (Signed)
Called patient to inform of fu with Dr. Isidore Moos on 11-23-17 @ 2 pm, spoke with patient's wife - Brent Franklin and she is aware of this appt.

## 2017-10-20 ENCOUNTER — Encounter (HOSPITAL_COMMUNITY): Payer: Self-pay | Admitting: Otolaryngology

## 2017-10-20 MED ORDER — ENSURE ENLIVE PO LIQD
237.0000 mL | Freq: Three times a day (TID) | ORAL | Status: DC
  Administered 2017-10-20 – 2017-10-23 (×10): 237 mL via ORAL

## 2017-10-20 NOTE — Progress Notes (Signed)
Pt is refusing continuous pulse oximetry monitoring throughout the night per RN. RRT assess patient oxygen level via spot check pulse ox. 98% on RA Trach Collar. Pt is in no distress at this time. No respiratory compromise noted. Pt does have a strong productive cough. Coughing up white/clear secretions out of his trach site. Spontaneous cough. Pt is stable at this time.

## 2017-10-20 NOTE — Progress Notes (Signed)
ENT Progress Note  Subjective: No nausea, no vomiting No difficulty voiding Pain well controlled  Fed himself via G tube, SLIV Refused vitals, IV, SCDs overnight. Removed BP cuff and heart monitor leads.   Vitals:   10/20/17 0540 10/20/17 0557  BP: (!) 124/58   Pulse: 74   Resp: 13   Temp:  97.9 F (36.6 C)  SpO2: 97%      OBJECTIVE  Gen: alert, cooperative, appropriate, somewhat aggitated Head/ENT: EOMI, neck supple, mucus membranes moist and pink, conjunctiva clear Face moves symmetrically Respiratory: Voice with mild dysphonia. non-labored breathing, no accessory muscle use, normal HR, good O2 saturations 4 CFS trach in place, minor serosanguinous drainage in and around trach, xeroform gauze in place  ASSESS/ PLAN  Brent Franklin is a 84 y.o. male who is POD 1 from tracheotomy (awake).  -pain control -Keep Xeroform in place -Keep trach collar tight -Humidified air at all times please -Suction PRN -Will cap/ PMV tomorrow with trach team -Transfer to floor.  -Patient may perform his own G tube feeding and hydration per home routine. Please SLIV when tolerating all feeds and hydration via G tube.   Helayne Seminole, MD

## 2017-10-20 NOTE — Progress Notes (Signed)
Pt refuses continuous monitoring. Pt educated multiple times but continues to refuse. Pt did agree to allow care team to get his vitals every four hours.

## 2017-10-21 NOTE — Progress Notes (Signed)
Patient does not want to be bothered throughout the night, pt wants to rest.

## 2017-10-21 NOTE — Progress Notes (Signed)
RN and RT have both spoke with patient about wearing a continuous pulse ox and the danger of not wearing one. Patient still insist that he does not wanna wear one and that we can check it when we come in and do our rounds.

## 2017-10-21 NOTE — Progress Notes (Signed)
Pt resting comfortably with no distress.  Pt currently on room air trach collar with documented sat checks of 97%.  Will cont to monitor progress.

## 2017-10-21 NOTE — Progress Notes (Signed)
Patient is sleeping comfortably at this time, no distress or complications noted.

## 2017-10-21 NOTE — Progress Notes (Signed)
Spoke with patient regarding the importance of wearing continuous pulse ox. Patient states he will not wear the pulse ox as it is very irritating to him.

## 2017-10-21 NOTE — Progress Notes (Signed)
ENT Progress Note  Subjective: No nausea, no vomiting No difficulty voiding Pain well controlled  Fed himself via G tube, SLIV Refused vitals, IV, SCDs overnight.  Transferred to floor.   Vitals:   10/20/17 2119 10/21/17 0011  BP: (!) 147/75   Pulse: 94   Resp: 16   Temp: 99.2 F (37.3 C)   SpO2: 97% 98%     OBJECTIVE  Gen: alert, cooperative, appropriate, somewhat aggitated Head/ENT: EOMI, neck supple, mucus membranes moist and pink, conjunctiva clear Face moves symmetrically Respiratory: Voice with mild dysphonia. non-labored breathing, no accessory muscle use, normal HR, good O2 saturations 4 CFS trach in place, minor serosanguinous drainage in and around trach, xeroform gauze removed  ASSESS/ PLAN  Brent Franklin is a 84 y.o. male who is POD 2 from tracheotomy (awake).  -pain control -Keep trach collar tight -Humidified air at all times please -Suction PRN -May cap/ PMV as tolerated with trach team   Helayne Seminole, MD

## 2017-10-22 LAB — GLUCOSE, CAPILLARY: Glucose-Capillary: 142 mg/dL — ABNORMAL HIGH (ref 70–99)

## 2017-10-22 NOTE — Progress Notes (Signed)
Pt still refusing to use continuous pulse ox. At this time, pt with no respiratory distress. Will monitor pt.

## 2017-10-22 NOTE — Progress Notes (Signed)
   ENT Progress Note: POD #3 s/p Procedure(s): TRACHEOSTOMY   Subjective: Patient's airway stable  Objective: Vital signs in last 24 hours: Temp:  [98.6 F (37 C)] 98.6 F (37 C) (09/08 1403) Pulse Rate:  [83-100] 93 (09/09 0730) Resp:  [18-19] 18 (09/09 0730) BP: (121-134)/(65-70) 134/70 (09/08 1950) SpO2:  [95 %-97 %] 97 % (09/09 0730) FiO2 (%):  [21 %] 21 % (09/09 0730) Weight change:  Last BM Date: (PTA)  Intake/Output from previous day: 09/08 0701 - 09/09 0700 In: 440  Out: -  Intake/Output this shift: No intake/output data recorded.  Labs: No results for input(s): WBC, HGB, HCT, PLT in the last 72 hours. No results for input(s): NA, K, CL, CO2, GLUCOSE, BUN, CALCIUM in the last 72 hours.  Invalid input(s): CREATININR  Studies/Results: No results found.   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  Patient is doing well with his tracheostomy tube.  Plan Passy-Muir valve trial and tracheostomy capping.  Anticipate discharge midday tomorrow to home with appropriate supplies.  The patient is currently using [facility] for his gastrostomy tube needs.    [place], Brent Franklin 10/22/2017, 8:54 AM

## 2017-10-22 NOTE — Evaluation (Signed)
Passy-Muir Speaking Valve - Evaluation Patient Details  Name: Brent Franklin MRN: [id number] Date of Birth: 1931/10/06  Today's Date: 10/22/2017 Time: 4098-1191 SLP Time Calculation (min) (ACUTE ONLY): 22 min  Past Medical History:  Past Medical History:  Diagnosis Date  . Cataract   . Diabetes mellitus without complication ([place])   . 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 Hyperlipidemia   . Hypertension   . Inguinal hernia   . Insomnia 05/06/2013  . Rash 05/14/2013  . S/P radiation therapy 05/07/13- 06/20/13   Base of tongue/bilat neck 66 Gy, 33 fx  . S/P radiation therapy 04/16/17- 05/28/17   Oropharynx, 2 Gy in 30 fractions for a total dose of 60 Gy  . Stroke [facility]) 2001   eye  . 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 Tuberculosis    + PPD NO [place]   . Uses feeding tube    Past Surgical History:  Past Surgical History:  Procedure Laterality Date  . EYE SURGERY  2000, 2001   cataract  . INGUINAL HERNIA REPAIR  02/13/2012   Procedure: HERNIA REPAIR INGUINAL ADULT;  Surgeon: Adin Hector, MD;  Location: [place];  Service: General;  Laterality: Right;  . INSERTION OF MESH  02/13/2012   Procedure: INSERTION OF MESH;  Surgeon: Adin Hector, MD;  Location: [place];  Service: General;  Laterality: N/A;  . LAPAROSCOPIC GASTROSTOMY N/A 05/05/2013   Procedure: LAPAROSCOPIC GASTROSTOMY;  Surgeon: Adin Hector, MD;  Location: [place];  Service: General;  Laterality: N/A;  . PORTACATH PLACEMENT N/A 05/05/2013   Procedure: INSERTION PORT-A-CATH;  Surgeon: Adin Hector, MD;  Location: [place];  Service: General;  Laterality: N/A;  . TRACHEOSTOMY TUBE PLACEMENT N/A 10/19/2017   Procedure: TRACHEOSTOMY;  Surgeon: Jerrell Belfast, MD;  Location: [place];  Service: ENT;  Laterality: N/A;   HPI:  The patient is a 84 y.o. 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 Per chart pt treated with a second round of radiation therapy. Per ENT, pt's cancer is terminal and trach performed 9/6 as more safety/comfort measure.    Assessment / Plan / Recommendation Clinical Impression  Dr. Wilburn Cornelia arrived as therapist reviewing chart and reported pt's tumor is unoperable. Pt is a retired Engineer, drilling, cognitively intact and eager to try PMV. Lurline Idol is #4 uncuffed and able to achieve intermittent, decreased intensity vocalization at baseline. Inspiratory and expirtatory exhange was good with valve donned remaining on trach hub after falling off x 1 following initial placement. PMV use allowed for increased air available to upper airway and increased duration of phonation and intensity.  SpO2 97%, HR 96 and RR WNL. Coordination of respiration and verbalization was adequate. Given only minimal verbal instructions pt demonstrated donning and doffing with supervision, min prompts then independently demonstrated. Leash attached to trach and education provided re: cleaning, removal when sleeping etc. Recommend pt wear valve during all waking hours. RN educated on use of PMV. SLP Visit Diagnosis: Aphonia (R49.1)    SLP Assessment  Patient needs continued Speech Lanaguage Pathology Services    Follow Up Recommendations  None    Frequency and Duration min 2x/week  2 weeks    PMSV Trial PMSV was placed for: 22 min Able to redirect subglottic air through upper airway: Yes Able to Attain Phonation:  Yes Voice Quality: Hoarse Able to Expectorate Secretions: Yes Level of Secretion Expectoration with PMSV: Oral Breath Support for Phonation: Adequate Intelligibility: Intelligibility reduced Word: 75-100% accurate Phrase: 75-100% accurate Sentence: 75-100% accurate Conversation: 75-100% accurate Behavior: Alert;Cooperative;Responsive to questions;Good eye contact   Tracheostomy Tube       Vent Dependency  FiO2 (%): 21 %    Cuff  Deflation Trial  GO Tolerated Cuff Deflation: (no cuff)        Houston Siren 10/22/2017, 1:33 PM  Orbie Pyo Colvin Caroli.Ed [employer] [phone]

## 2017-10-22 NOTE — Care Management Note (Addendum)
Case Management Note  Patient Details  Name: WILFREDO CANTERBURY MRN: [id number] Date of Birth: 03-23-1931  Subjective/Objective:                    Action/Plan:  Discussed discharge planning with patient at bedside. Patient from home with wife Remo Lipps . He has PEG and receives tube feeding supplies through [facility] and would like to continue to use [facility].  Respiratory therapy has teaching set up for today at 1500 with patient and wife.  Called DR Shoemaker's office to clarify if patient discharging home on humidified air or oxygen . Awaiting call back.   Ordered trach care kits, 10 FR suction catheters, suction 4 cuff less trachs.   Referral given to Neoma Laming with [facility].  At discharge bedside nurse will need to provide patient with an extra 4 CFS Shiley and amb bag. Bedside nurse and patient aware.   Expected Discharge Date:                  Expected Discharge Plan:  [place]  In-House Referral:     Discharge planning Services  CM Consult  Post Acute Care Choice:  Durable Medical Equipment, Home Health Choice offered to:  Patient  DME Arranged:  Trach supplies, Suction DME Agency:  [place]:  RN [facility] Agency:  [place]  Status of Service:  In process, will continue to follow  If discussed at Long Length of Stay Meetings, dates discussed:    Additional Comments:  Marilu Favre, RN 10/22/2017, 10:18 AM

## 2017-10-23 DIAGNOSIS — C321 Malignant neoplasm of supraglottis: Secondary | ICD-10-CM | POA: Diagnosis not present

## 2017-10-23 DIAGNOSIS — I10 Essential (primary) hypertension: Secondary | ICD-10-CM | POA: Diagnosis not present

## 2017-10-23 DIAGNOSIS — E119 Type 2 diabetes mellitus without complications: Secondary | ICD-10-CM | POA: Diagnosis not present

## 2017-10-23 DIAGNOSIS — C029 Malignant neoplasm of tongue, unspecified: Secondary | ICD-10-CM | POA: Diagnosis not present

## 2017-10-23 DIAGNOSIS — Z931 Gastrostomy status: Secondary | ICD-10-CM | POA: Diagnosis not present

## 2017-10-23 DIAGNOSIS — Z430 Encounter for attention to tracheostomy: Secondary | ICD-10-CM | POA: Diagnosis not present

## 2017-10-23 NOTE — Progress Notes (Signed)
RT note: Patient was sleeping with PMV in place. RT asked patient if he meant to wear valve while sleeping? Patient stated of course not and removed valve by himself. DR Gaspar Bidding allowed me to check is vital signs and they stable through out at this time. RT changed humidity bottle trach collar set up.

## 2017-10-23 NOTE — Progress Notes (Signed)
Pt refusing to allow RT to check sats or clean trach at this time. Pt states get out of my room. RT will continue to monitor.

## 2017-10-23 NOTE — Progress Notes (Signed)
Pt will not allow RT to further assess his trach or listen to BS. Pt did allow me to look at the trach at this time. Pt states he does not want to be bothered.

## 2017-10-23 NOTE — Care Management Important Message (Signed)
Important Message  Patient Details  Name: Brent Franklin MRN: [id number] Date of Birth: 1931-09-22   Medicare Important Message Given:  Yes    Dixie Coppa Montine Circle 10/23/2017, 9:53 AM

## 2017-10-23 NOTE — Plan of Care (Signed)
  Problem: Education: Goal: Knowledge of General Education information will improve Description Including pain rating scale, medication(s)/side effects and non-pharmacologic comfort measures Outcome: Progressing   Problem: Health Behavior/Discharge Planning: Goal: Ability to manage health-related needs will improve Outcome: Progressing   Problem: Clinical Measurements: Goal: Ability to maintain clinical measurements within normal limits will improve Outcome: Progressing Goal: Will remain free from infection Outcome: Progressing Goal: Respiratory complications will improve Outcome: Progressing   Problem: Activity: Goal: Risk for activity intolerance will decrease Outcome: Progressing   Problem: Nutrition: Goal: Adequate nutrition will be maintained Outcome: Progressing   Problem: Elimination: Goal: Will not experience complications related to bowel motility Outcome: Progressing   Problem: Pain Managment: Goal: General experience of comfort will improve Outcome: Progressing   Problem: Safety: Goal: Ability to remain free from injury will improve Outcome: Progressing Forestine Chute, RN 10/23/2017 12:45 AM

## 2017-10-23 NOTE — Consult Note (Signed)
            [name]  10/23/2017  Brent Franklin [birth year] [id number]   Went to see patientat the bedsideto identify possible discharge needs. 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 (status post tracheostomy)  PatientendorsesDr. Prince Franklin with [place] primary care provider.   [facility] [address] [facility] obtainmedications withoutdifficulty.  Patient reportsmanaginghisownmedications at homestraight out of the containers.  Patient hasbeendrivingprior to admission/ surgery buthis family and friends are be able providetransportationto his doctors' appointmentsafter discharge.   Patientstatesthathis wife Brent Franklin) and children will serve as hisprimary caregiversat home after discharge.  Anticipated discharge plan ishome-back to [place] [facility] per patient.  Patientvoiced understanding to call primary care provider'soffice whenhegets back home for a post discharge follow-up appointment within1- 2 weeksor sooner if needs arise.Patient letter (with PCP's contact number) was provided asa reminder.  Explained topatientregardingTHN CM services available for health management andresourcesat home,buthedenies any needs or concerns at this time. He states that hehas beenmanaginghishealth issues well at home and does not feel the need for services right now. Patienthad politelydeclined [facility]-CM services offeredwhichincludes EMMIcalls to follow-up with his recovery at home. Patientwas encouragedto seekreferral to [facility] care management from primary care providerifdeemed necessary and appropriate foranyservicesin thenear future.   [facility] care management information provided for future needs  thathemay have.    For additional questions please contact:  Brent Franklin, BSN, RN-BC [facility] PRIMARY CARE Navigator Cell: [phone]

## 2017-10-23 NOTE — Progress Notes (Signed)
   ENT Progress Note: POD #4 s/p Procedure(s): TRACHEOSTOMY   Subjective: Airway stable, no pain  Objective: Vital signs in last 24 hours: Temp:  [98 F (36.7 C)-98.6 F (37 C)] 98.3 F (36.8 C) (09/10 0557) Pulse Rate:  [78-88] 78 (09/10 0757) Resp:  [16-18] 16 (09/10 0757) BP: (122-150)/(71-78) 150/77 (09/10 0557) SpO2:  [93 %-100 %] 97 % (09/10 0757) FiO2 (%):  [21 %-28 %] 21 % (09/10 0757) Weight change:  Last BM Date: 10/20/17  Intake/Output from previous day: 09/09 0701 - 09/10 0700 In: 648  Out: -  Intake/Output this shift: No intake/output data recorded.  Labs: No results for input(s): WBC, HGB, HCT, PLT in the last 72 hours. No results for input(s): NA, K, CL, CO2, GLUCOSE, BUN, CALCIUM in the last 72 hours.  Invalid input(s): CREATININR  Studies/Results: No results found.   PHYSICAL EXAM: Trach in place, airway stable   Assessment/Plan: Plan d/c to home/care facility F/U as OP in ~2 wks for trach change    Brent Franklin 10/23/2017, 8:26 AM

## 2017-10-23 NOTE — Care Management Note (Addendum)
Case Management Note  Patient Details  Name: Brent Franklin MRN: [id number] Date of Birth: 01/02/1932  Subjective/Objective:                    Action/Plan:  Discussed discharge plan with patient at bedside. Discussed going back to [place] with [facility] vs SNF. Patient wants to go back to independent living with [facility] and has already discussed with [facility] they have RN and RT visit planned for today.   Bedside nurse will provide extra 4 cuffless and A, bag at discharge.Patient has portable suction machine in room.   Called DR [employer] office was transferred to [facility] voice mail. Expected Discharge Date:  10/23/17               Expected Discharge Plan:  [place]  In-House Referral:     Discharge planning Services  CM Consult  Post Acute Care Choice:  Durable Medical Equipment, Home Health Choice offered to:  Patient  DME Arranged:  Trach supplies, Suction DME Agency:  [place]:  RN [facility] Agency:  [place]  Status of Service:  Completed, signed off  If discussed at [place] of Stay Meetings, dates discussed:    Additional Comments:  Marilu Favre, RN 10/23/2017, 10:00 AM

## 2017-10-23 NOTE — Discharge Summary (Signed)
Physician Discharge Summary  Patient ID: [name] MRN: [id number] DOB/AGE: 82-Nov-1933 82 y.o.  Admit date: 10/19/2017 Discharge date: 10/23/2017  Admission Diagnoses:  Active Problems:   Airway obstruction   Discharge Diagnoses:  Same  Surgeries: Procedure(s): TRACHEOSTOMY on 10/19/2017   Consultants: None  Discharged Condition: Improved  Hospital Course: Brent Franklin is an 84 y.o. male who was admitted 10/19/2017 with a diagnosis of Active Problems:   Airway obstruction  and went to the operating room on 10/19/2017 and underwent the above named procedures.   Patient stable after tracheostomy procedure on 10/19/2017.  Patient ready for discharge on postoperative day 4 after appropriate trach teaching and care.  Airway stable.  Home health care and supplies in place.  Recent vital signs:  Vitals:   10/23/17 0557 10/23/17 0757  BP: (!) 150/77   Pulse: 86 78  Resp: 17 16  Temp: 98.3 F (36.8 C)   SpO2: 97% 97%    Recent laboratory studies:  Results for orders placed or performed during the hospital encounter of 10/19/17  MRSA PCR Screening  Result Value Ref Range   MRSA by PCR NEGATIVE NEGATIVE  Basic metabolic panel  Result Value Ref Range   Sodium 140 135 - 145 mmol/L   Potassium 4.1 3.5 - 5.1 mmol/L   Chloride 100 98 - 111 mmol/L   CO2 30 22 - 32 mmol/L   Glucose, Bld 119 (H) 70 - 99 mg/dL   BUN 29 (H) 8 - 23 mg/dL   Creatinine, Ser 0.77 0.61 - 1.24 mg/dL   Calcium 9.1 8.9 - 10.3 mg/dL   GFR calc non Af Amer >60 >60 mL/min   GFR calc Af Amer >60 >60 mL/min   Anion gap 10 5 - 15  Hemoglobin A1c  Result Value Ref Range   Hgb A1c MFr Bld 6.3 (H) 4.8 - 5.6 %   Mean Plasma Glucose 134.11 mg/dL  CBC  Result Value Ref Range   WBC 6.6 4.0 - 10.5 K/uL   RBC 4.09 (L) 4.22 - 5.81 MIL/uL   Hemoglobin 12.4 (L) 13.0 - 17.0 g/dL   HCT 39.4 39.0 - 52.0 %   MCV 96.3 78.0 - 100.0 fL   MCH 30.3 26.0 - 34.0 pg   MCHC 31.5 30.0 - 36.0 g/dL   RDW 13.0 11.5 - 15.5 %   Platelets 187 150 - 400 K/uL  Glucose, capillary  Result Value Ref Range   Glucose-Capillary 107 (H) 70 - 99 mg/dL   Comment 1 Notify RN    Comment 2 Document in Chart   Glucose, capillary  Result Value Ref Range   Glucose-Capillary 168 (H) 70 - 99 mg/dL  Glucose, capillary  Result Value Ref Range   Glucose-Capillary 253 (H) 70 - 99 mg/dL  Glucose, capillary  Result Value Ref Range   Glucose-Capillary 200 (H) 70 - 99 mg/dL  Glucose, capillary  Result Value Ref Range   Glucose-Capillary 142 (H) 70 - 99 mg/dL    Discharge Medications:     Diagnostic Studies: No results found.  Disposition:     Follow-up Information    Jerrell Belfast, MD In 2 weeks.   Specialty:  Otolaryngology Contact information: [address] [place] [address] [phone]            Signed: Jerrell Belfast 10/23/2017, 8:29 AM

## 2017-10-24 DIAGNOSIS — E119 Type 2 diabetes mellitus without complications: Secondary | ICD-10-CM | POA: Diagnosis not present

## 2017-10-24 DIAGNOSIS — Z430 Encounter for attention to tracheostomy: Secondary | ICD-10-CM | POA: Diagnosis not present

## 2017-10-24 DIAGNOSIS — I10 Essential (primary) hypertension: Secondary | ICD-10-CM | POA: Diagnosis not present

## 2017-10-24 DIAGNOSIS — C321 Malignant neoplasm of supraglottis: Secondary | ICD-10-CM | POA: Diagnosis not present

## 2017-10-24 DIAGNOSIS — Z931 Gastrostomy status: Secondary | ICD-10-CM | POA: Diagnosis not present

## 2017-10-24 DIAGNOSIS — C029 Malignant neoplasm of tongue, unspecified: Secondary | ICD-10-CM | POA: Diagnosis not present

## 2017-10-25 DIAGNOSIS — Z931 Gastrostomy status: Secondary | ICD-10-CM | POA: Diagnosis not present

## 2017-10-25 DIAGNOSIS — I10 Essential (primary) hypertension: Secondary | ICD-10-CM | POA: Diagnosis not present

## 2017-10-25 DIAGNOSIS — E119 Type 2 diabetes mellitus without complications: Secondary | ICD-10-CM | POA: Diagnosis not present

## 2017-10-25 DIAGNOSIS — Z430 Encounter for attention to tracheostomy: Secondary | ICD-10-CM | POA: Diagnosis not present

## 2017-10-25 DIAGNOSIS — C029 Malignant neoplasm of tongue, unspecified: Secondary | ICD-10-CM | POA: Diagnosis not present

## 2017-10-25 DIAGNOSIS — C321 Malignant neoplasm of supraglottis: Secondary | ICD-10-CM | POA: Diagnosis not present

## 2017-10-26 ENCOUNTER — Ambulatory Visit: Payer: Medicare Other | Admitting: Physical Therapy

## 2017-10-29 DIAGNOSIS — Z430 Encounter for attention to tracheostomy: Secondary | ICD-10-CM | POA: Diagnosis not present

## 2017-10-29 DIAGNOSIS — Z931 Gastrostomy status: Secondary | ICD-10-CM | POA: Diagnosis not present

## 2017-10-29 DIAGNOSIS — C01 Malignant neoplasm of base of tongue: Secondary | ICD-10-CM | POA: Diagnosis not present

## 2017-10-29 DIAGNOSIS — I10 Essential (primary) hypertension: Secondary | ICD-10-CM | POA: Diagnosis not present

## 2017-10-29 DIAGNOSIS — C029 Malignant neoplasm of tongue, unspecified: Secondary | ICD-10-CM | POA: Diagnosis not present

## 2017-10-29 DIAGNOSIS — E119 Type 2 diabetes mellitus without complications: Secondary | ICD-10-CM | POA: Diagnosis not present

## 2017-10-29 DIAGNOSIS — Z930 Tracheostomy status: Secondary | ICD-10-CM | POA: Diagnosis not present

## 2017-10-29 DIAGNOSIS — C321 Malignant neoplasm of supraglottis: Secondary | ICD-10-CM | POA: Diagnosis not present

## 2017-11-01 ENCOUNTER — Ambulatory Visit: Payer: No Typology Code available for payment source | Attending: Radiation Oncology | Admitting: Physical Therapy

## 2017-11-01 ENCOUNTER — Encounter: Payer: Self-pay | Admitting: Physical Therapy

## 2017-11-01 DIAGNOSIS — C029 Malignant neoplasm of tongue, unspecified: Secondary | ICD-10-CM | POA: Diagnosis not present

## 2017-11-01 DIAGNOSIS — I10 Essential (primary) hypertension: Secondary | ICD-10-CM | POA: Diagnosis not present

## 2017-11-01 DIAGNOSIS — R293 Abnormal posture: Secondary | ICD-10-CM | POA: Insufficient documentation

## 2017-11-01 DIAGNOSIS — E119 Type 2 diabetes mellitus without complications: Secondary | ICD-10-CM | POA: Diagnosis not present

## 2017-11-01 DIAGNOSIS — Z430 Encounter for attention to tracheostomy: Secondary | ICD-10-CM | POA: Diagnosis not present

## 2017-11-01 DIAGNOSIS — Z931 Gastrostomy status: Secondary | ICD-10-CM | POA: Diagnosis not present

## 2017-11-01 DIAGNOSIS — C321 Malignant neoplasm of supraglottis: Secondary | ICD-10-CM | POA: Diagnosis not present

## 2017-11-01 DIAGNOSIS — I890 Lymphedema, not elsewhere classified: Secondary | ICD-10-CM | POA: Diagnosis not present

## 2017-11-01 NOTE — Patient Instructions (Addendum)
Manual lymph drainage for the neck, anterior approach  Sit in front of a mirror. Do 5 slow deep breaths, breathing in through the nose and out through the mouth, letting your belly "inflate" as you breathe in.  Rest your hands on your abdomen as you do this to give slight pressure there.  1) Place hands on areas just behind collar bones and do 10 stationary circles with stretch in outward directions. 2) Do stationary circles at each armpit about 10 times. 3) Place one hand on the front of the opposite shoulder and do stationary circles with stretch downward toward underarm. 4) Repeat #1 above. 5) Imagine a river running in a line from the earlobe straight down the neck.  Place hands just behind this and do circles with stretch coming forward slightly and down, thinking about fluid flowing down that river.  Do 10 times. 6) Place one hand just in front of the river on one side and do circles with a slight back and then downward stretch, thinking again about putting that fluid in the river.  Do 10-20 times on each side. 7) Place one hand just slightly in front of the spot you just did and do the same thing.  DO THIS VERY GENTLY. 8) Use the webspace between your thumb and index finger to pump downward starting just under the chin and "stair stepping" downward with a stretch, working down to the chest. 9) Repeat #1. 10) Do stationary circles on each side of the face just above the chin, out and down with the stretch 10 times. 11) Do stationary circles on each side of the face on the cheeks with pressure going back and down, 10 times. 12) Do stationary circles on each side of the face between the eyes and ears, again back and downward 10 times. 13) Repeat steps 9,8,7,6,5,1,3 and 2 in that order!  Do not slide on the skin, but STRETCH it with your motions. Only give enough pressure to stretch the skin. DO THIS SLOWLY, PLEASE!  And do once a day.     Wear chip 1-2 every day, twice a day if you  can  Face garments are available on line.  Look at the ones on [email or web address]   Minimal coverage face mask

## 2017-11-01 NOTE — Therapy (Signed)
[place], [place], [address] Phone: [phone]   Fax:  [phone]  Physical Therapy Evaluation  Patient Details  Name: Brent Franklin MRN: [id number] Date of Birth: January 29, 1932 Referring Provider: Dr. Isidore Moos    Encounter Date: 11/01/2017  PT End of Session - 11/01/17 1720    Visit Number  1    Number of Visits  9    Date for PT Re-Evaluation  01/01/18   start of care will be delayed    PT Start Time  1600    PT Stop Time  1645    PT Time Calculation (min)  45 min    Activity Tolerance  Patient tolerated treatment well    Behavior During Therapy  [facility] for tasks assessed/performed       Past Medical History:  Diagnosis Date  . Cataract   . Diabetes mellitus without complication ([place])   . 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 Hyperlipidemia   . Hypertension   . Inguinal hernia   . Insomnia 05/06/2013  . Rash 05/14/2013  . S/P radiation therapy 05/07/13- 06/20/13   Base of tongue/bilat neck 66 Gy, 33 fx  . S/P radiation therapy 04/16/17- 05/28/17   Oropharynx, 2 Gy in 30 fractions for a total dose of 60 Gy  . Stroke [facility]) 2001   eye  . 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 Tuberculosis    + PPD NO [place]   . Uses feeding tube     Past Surgical History:  Procedure Laterality Date  . EYE SURGERY  2000, 2001   cataract  . INGUINAL HERNIA REPAIR  02/13/2012   Procedure: HERNIA REPAIR INGUINAL ADULT;  Surgeon: Adin Hector, MD;  Location: [place];  Service: General;  Laterality: Right;  . INSERTION OF MESH  02/13/2012   Procedure: INSERTION OF MESH;  Surgeon: Adin Hector, MD;  Location: [place];  Service: General;  Laterality: N/A;  . LAPAROSCOPIC GASTROSTOMY N/A 05/05/2013   Procedure: LAPAROSCOPIC GASTROSTOMY;  Surgeon: Adin Hector, MD;  Location: [place];  Service: General;  Laterality: N/A;  . PORTACATH PLACEMENT N/A 05/05/2013   Procedure: INSERTION  PORT-A-CATH;  Surgeon: Adin Hector, MD;  Location: [place];  Service: General;  Laterality: N/A;  . TRACHEOSTOMY TUBE PLACEMENT N/A 10/19/2017   Procedure: TRACHEOSTOMY;  Surgeon: Jerrell Belfast, MD;  Location: [place];  Service: ENT;  Laterality: N/A;    There were no vitals filed for this visit.   Subjective Assessment - 11/01/17 1701    Subjective  Pt is here to get help with his lymphedema    Pertinent History  cancer at the base of the tongue Feb. 2015 with radiation.  Recurrance in Feb. 2019 with radiation that was completed in May of 2019 . Pt developed lymphedema in his neck  He has  PEG tube.  He underwent a tracheostomy 10/19/2017 and speaks with a Passey -Muir valve.    Patient Stated Goals  to reduction of his lymphedema if possible     Currently in Pain?  No/denies    Pain Score  0-No pain         [facility] PT Assessment - 11/01/17 0001      Assessment   Medical Diagnosis  recurrance of cancer at the base of the tongue     Referring Provider  Dr. Isidore Moos     Onset Date/Surgical Date  03/16/17  approx     Precautions   Precautions  None    Precaution Comments  pt has tracheostomy with passey muir valve       Restrictions   Weight Bearing Restrictions  No      Balance Screen   Has the patient fallen in the past 6 months  No    Has the patient had a decrease in activity level because of a fear of falling?   No    Is the patient reluctant to leave their home because of a fear of falling?   No      Home Film/video editor residence    Living Arrangements  Spouse/significant other    Type of [place] living facility      Prior Function   Level of [place]  Retired    Leisure  pt used to exercise and has been trying to walk since radiation stopped, but he feels weak       Cognition   Overall Cognitive Status  Within Functional Limits for tasks assessed      Observation/Other Assessments   Observations   thin male walks without device into our clinic.  He has a trach and has to spit out mouth secretions throughout the session     Skin Integrity  no open areas       Coordination   Gross Motor Movements are Fluid and Coordinated  Yes   except for attempts to stand up straight      Functional Tests   Functional tests  Sit to Stand      Sit to Stand   Comments  16 repetitions in 30 seconds with no difficulty       Posture/Postural Control   Posture/Postural Control  Postural limitations    Postural Limitations  Rounded Shoulders;Forward head;Increased thoracic kyphosis      ROM / Strength   AROM / PROM / Strength  AROM;Strength      AROM   Overall AROM   Deficits    Overall AROM Comments  pt has very lmited neck range and thoracic range of motion     AROM Assessment Site  Cervical    Cervical Flexion  --   about 25%   Cervical Extension  --   only 10% available, c/o pulling at trach   Cervical - Right Side Bend  --   only about 10%    Cervical - Left Side Bend  --   only about 10%   Cervical - Right Rotation  --   only about 10%   Cervical - Left Rotation  --   only about 10%     Strength   Overall Strength  Within functional limits for tasks performed      Palpation   Palpation comment  firm pitting lymphostatic fiborsis in submental area under chin and above trach. 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      [place] Adult PT Treatment/Exercise - 11/01/17 0001      Self-Care   Self-Care  Other Self-Care Comments    Other Self-Care Comments   Pt was told that he will not be able to come to outpatient PT until home health nursing is discontinued. so he wants to learn as much as he can this session. He was given hanout for [facility] lymph drainage and instructed to do belly breathing and gentle skin stretch at neck, given a chip pack for neck and given print out to order [place] group minimal chin strap if he likes. Pt encouraged to do neck range of motion and posture exercises              PT Education - 11/01/17 1720    Education Details  see self care section     Person(s) Educated  Patient    Methods  Explanation;Handout;Demonstration    Comprehension  Need further instruction          PT Long Term Goals - 11/01/17 1730      PT LONG TERM GOAL #1   Title  Pt will be independent in self manual lymph draiange and use of compression to manage his lymphedema     Time  8    Period  Weeks    Status  New      PT LONG TERM GOAL #2   Title  Pt will be independent in a home exercise for posture and neck range of motion    Time  8    Period  Weeks    Status  New      PT LONG TERM GOAL #3   Title  Pt will report that his neck feels 50% softer at areas of lymphedema     Time  6    Period  Weeks    Status  New      PT LONG TERM GOAL #4   Title  Pt will have ~ 25% of neck rotation to each side     Time  8    Period  Weeks    Status  New             Plan - 11/01/17 1721    Clinical Impression Statement  84 yo male who has had 2 episodes of head and neck cancer presents with pitting lymphedema under his chin and along jawline.  He has very limited neck and thoracic moblity with poor posture He has a trach and speaks with a passey muir valve.  I feel that he would improve with manual lymph drainage  and compression and he was given beginning  instruction.  His start of ourpatient will be delayed ( he thinks about 2 weeks ) until his home health nurse visits are discontinued due to insurance  "homebound" regulations.     History and Personal Factors relevant to plan of care:  tracheostomy, recurrance of cancer     Clinical Presentation  Stable    Clinical Decision Making  Low    Rehab Potential  Excellent    Clinical Impairments Affecting Rehab Potential  as above  PT Frequency  2x / week    PT Duration  8 weeks   may  not need all visits    PT Treatment/Interventions  ADLs/Self Care Home Management;DME Instruction;Therapeutic exercise;Therapeutic activities;Patient/family education;Manual techniques;Compression bandaging;Scar mobilization;Manual lymph drainage;Taping    PT Next Visit Plan  assess if pt has been able to wear chip pack or do any MLD at home.  Begin and teach MLD, neck range of [place], [place] posture exercise     Consulted and Agree with Plan of Care  Patient       Patient will benefit from skilled therapeutic intervention in order to improve the following deficits and impairments:  Increased edema, Decreased range of motion, Postural dysfunction  Visit Diagnosis: Lymphedema, not elsewhere classified  Abnormal posture     Problem List Patient Active Problem List   Diagnosis Date Noted  . Airway obstruction 10/19/2017  . S/P percutaneous endoscopic gastrostomy (PEG) tube placement ([place]) 12/15/2013  . Dysphagia 09/15/2013  . Malnutrition ([place]) 07/14/2013  . Hypomagnesemia 06/03/2013  . Type II or unspecified type diabetes mellitus without mention of complication, not stated as uncontrolled 06/03/2013  . Rash 05/14/2013  . Insomnia 05/06/2013  . Malignant neoplasm of base of tongue ([place]) 04/18/2013  . HTN (hypertension) 03/03/2013  . Right inguinal hernia 01/31/2012   Donato Heinz. Owens Shark PT  Norwood Levo 11/01/2017, 5:33 PM  [place], [place], [address] Phone: [phone]   Fax:  [phone]  Name: Brent Franklin MRN: [id number] Date of Birth: 04/24/1931

## 2017-11-05 DIAGNOSIS — Z430 Encounter for attention to tracheostomy: Secondary | ICD-10-CM | POA: Diagnosis not present

## 2017-11-05 DIAGNOSIS — C321 Malignant neoplasm of supraglottis: Secondary | ICD-10-CM | POA: Diagnosis not present

## 2017-11-05 DIAGNOSIS — I10 Essential (primary) hypertension: Secondary | ICD-10-CM | POA: Diagnosis not present

## 2017-11-05 DIAGNOSIS — E119 Type 2 diabetes mellitus without complications: Secondary | ICD-10-CM | POA: Diagnosis not present

## 2017-11-05 DIAGNOSIS — C029 Malignant neoplasm of tongue, unspecified: Secondary | ICD-10-CM | POA: Diagnosis not present

## 2017-11-05 DIAGNOSIS — Z931 Gastrostomy status: Secondary | ICD-10-CM | POA: Diagnosis not present

## 2017-11-06 DIAGNOSIS — C321 Malignant neoplasm of supraglottis: Secondary | ICD-10-CM | POA: Diagnosis not present

## 2017-11-06 DIAGNOSIS — C029 Malignant neoplasm of tongue, unspecified: Secondary | ICD-10-CM | POA: Diagnosis not present

## 2017-11-06 DIAGNOSIS — Z931 Gastrostomy status: Secondary | ICD-10-CM | POA: Diagnosis not present

## 2017-11-06 DIAGNOSIS — Z430 Encounter for attention to tracheostomy: Secondary | ICD-10-CM | POA: Diagnosis not present

## 2017-11-06 DIAGNOSIS — E119 Type 2 diabetes mellitus without complications: Secondary | ICD-10-CM | POA: Diagnosis not present

## 2017-11-06 DIAGNOSIS — I10 Essential (primary) hypertension: Secondary | ICD-10-CM | POA: Diagnosis not present

## 2017-11-07 ENCOUNTER — Ambulatory Visit: Payer: Medicare Other | Admitting: Physical Therapy

## 2017-11-07 DIAGNOSIS — Z931 Gastrostomy status: Secondary | ICD-10-CM | POA: Diagnosis not present

## 2017-11-07 DIAGNOSIS — E119 Type 2 diabetes mellitus without complications: Secondary | ICD-10-CM | POA: Diagnosis not present

## 2017-11-07 DIAGNOSIS — C321 Malignant neoplasm of supraglottis: Secondary | ICD-10-CM | POA: Diagnosis not present

## 2017-11-07 DIAGNOSIS — Z430 Encounter for attention to tracheostomy: Secondary | ICD-10-CM | POA: Diagnosis not present

## 2017-11-07 DIAGNOSIS — I10 Essential (primary) hypertension: Secondary | ICD-10-CM | POA: Diagnosis not present

## 2017-11-07 DIAGNOSIS — C029 Malignant neoplasm of tongue, unspecified: Secondary | ICD-10-CM | POA: Diagnosis not present

## 2017-11-12 DIAGNOSIS — C321 Malignant neoplasm of supraglottis: Secondary | ICD-10-CM | POA: Diagnosis not present

## 2017-11-12 DIAGNOSIS — C029 Malignant neoplasm of tongue, unspecified: Secondary | ICD-10-CM | POA: Diagnosis not present

## 2017-11-12 DIAGNOSIS — Z931 Gastrostomy status: Secondary | ICD-10-CM | POA: Diagnosis not present

## 2017-11-12 DIAGNOSIS — I10 Essential (primary) hypertension: Secondary | ICD-10-CM | POA: Diagnosis not present

## 2017-11-12 DIAGNOSIS — Z430 Encounter for attention to tracheostomy: Secondary | ICD-10-CM | POA: Diagnosis not present

## 2017-11-12 DIAGNOSIS — E119 Type 2 diabetes mellitus without complications: Secondary | ICD-10-CM | POA: Diagnosis not present

## 2017-11-13 DIAGNOSIS — C029 Malignant neoplasm of tongue, unspecified: Secondary | ICD-10-CM | POA: Diagnosis not present

## 2017-11-13 DIAGNOSIS — Z931 Gastrostomy status: Secondary | ICD-10-CM | POA: Diagnosis not present

## 2017-11-13 DIAGNOSIS — Z430 Encounter for attention to tracheostomy: Secondary | ICD-10-CM | POA: Diagnosis not present

## 2017-11-13 DIAGNOSIS — I10 Essential (primary) hypertension: Secondary | ICD-10-CM | POA: Diagnosis not present

## 2017-11-13 DIAGNOSIS — C321 Malignant neoplasm of supraglottis: Secondary | ICD-10-CM | POA: Diagnosis not present

## 2017-11-13 DIAGNOSIS — E119 Type 2 diabetes mellitus without complications: Secondary | ICD-10-CM | POA: Diagnosis not present

## 2017-11-15 DIAGNOSIS — Z931 Gastrostomy status: Secondary | ICD-10-CM | POA: Diagnosis not present

## 2017-11-15 DIAGNOSIS — E119 Type 2 diabetes mellitus without complications: Secondary | ICD-10-CM | POA: Diagnosis not present

## 2017-11-15 DIAGNOSIS — I10 Essential (primary) hypertension: Secondary | ICD-10-CM | POA: Diagnosis not present

## 2017-11-15 DIAGNOSIS — C029 Malignant neoplasm of tongue, unspecified: Secondary | ICD-10-CM | POA: Diagnosis not present

## 2017-11-15 DIAGNOSIS — Z430 Encounter for attention to tracheostomy: Secondary | ICD-10-CM | POA: Diagnosis not present

## 2017-11-15 DIAGNOSIS — C321 Malignant neoplasm of supraglottis: Secondary | ICD-10-CM | POA: Diagnosis not present

## 2017-11-15 NOTE — Progress Notes (Signed)
Dr. Gaspar Bidding presents for follow up of radiation completed:  04/16/2017-05/28/2017: Oropharynx retreatment: 2 Gy x 30 fractions for a total dose of 60 Gy - retreatment 05/07/2013-06/20/2013: Base of tongue/bilateral neck / 66 Gy in 33 fractions (70 Gy in 35 fractions planned, but patient declined final 2 fractions due to side effects   Pain issues, if any: He denies Using a feeding tube?: Yes, 6 nutritional supplements daily.  Weight changes, if any:  Wt Readings from Last 3 Encounters:  11/23/17 156 lb 9.6 oz (71 kg)  10/20/17 156 lb 4.8 oz (70.9 kg)  10/09/17 159 lb (72.1 kg)   Swallowing issues, if any: N/A Smoking or chewing tobacco? No Using fluoride trays daily? No Last ENT visit was on: Dr. Wilburn Cornelia, tracheostomy placed 10/19/17 Other notable issues, if any:   BP (!) 141/78 (BP Location: Left Arm, Patient Position: Sitting)   Pulse (!) 105   Temp 98.9 F (37.2 C) (Oral)   Resp (!) 24   Ht 6\' 2"  (1.88 m)   Wt 156 lb 9.6 oz (71 kg)   SpO2 100%   BMI 20.11 kg/m

## 2017-11-19 DIAGNOSIS — E119 Type 2 diabetes mellitus without complications: Secondary | ICD-10-CM | POA: Diagnosis not present

## 2017-11-19 DIAGNOSIS — Z931 Gastrostomy status: Secondary | ICD-10-CM | POA: Diagnosis not present

## 2017-11-19 DIAGNOSIS — I10 Essential (primary) hypertension: Secondary | ICD-10-CM | POA: Diagnosis not present

## 2017-11-19 DIAGNOSIS — C029 Malignant neoplasm of tongue, unspecified: Secondary | ICD-10-CM | POA: Diagnosis not present

## 2017-11-19 DIAGNOSIS — Z430 Encounter for attention to tracheostomy: Secondary | ICD-10-CM | POA: Diagnosis not present

## 2017-11-19 DIAGNOSIS — C321 Malignant neoplasm of supraglottis: Secondary | ICD-10-CM | POA: Diagnosis not present

## 2017-11-20 DIAGNOSIS — Z430 Encounter for attention to tracheostomy: Secondary | ICD-10-CM | POA: Diagnosis not present

## 2017-11-20 DIAGNOSIS — C029 Malignant neoplasm of tongue, unspecified: Secondary | ICD-10-CM | POA: Diagnosis not present

## 2017-11-20 DIAGNOSIS — C321 Malignant neoplasm of supraglottis: Secondary | ICD-10-CM | POA: Diagnosis not present

## 2017-11-20 DIAGNOSIS — I10 Essential (primary) hypertension: Secondary | ICD-10-CM | POA: Diagnosis not present

## 2017-11-20 DIAGNOSIS — Z931 Gastrostomy status: Secondary | ICD-10-CM | POA: Diagnosis not present

## 2017-11-20 DIAGNOSIS — E119 Type 2 diabetes mellitus without complications: Secondary | ICD-10-CM | POA: Diagnosis not present

## 2017-11-22 DIAGNOSIS — I10 Essential (primary) hypertension: Secondary | ICD-10-CM | POA: Diagnosis not present

## 2017-11-22 DIAGNOSIS — Z931 Gastrostomy status: Secondary | ICD-10-CM | POA: Diagnosis not present

## 2017-11-22 DIAGNOSIS — C029 Malignant neoplasm of tongue, unspecified: Secondary | ICD-10-CM | POA: Diagnosis not present

## 2017-11-22 DIAGNOSIS — C321 Malignant neoplasm of supraglottis: Secondary | ICD-10-CM | POA: Diagnosis not present

## 2017-11-22 DIAGNOSIS — E119 Type 2 diabetes mellitus without complications: Secondary | ICD-10-CM | POA: Diagnosis not present

## 2017-11-22 DIAGNOSIS — Z430 Encounter for attention to tracheostomy: Secondary | ICD-10-CM | POA: Diagnosis not present

## 2017-11-23 ENCOUNTER — Ambulatory Visit
Admission: RE | Admit: 2017-11-23 | Discharge: 2017-11-23 | Disposition: A | Discharge status: Home / Self Care | Payer: Medicare Other | Source: Ambulatory Visit | Attending: Radiation Oncology | Admitting: Radiation Oncology

## 2017-11-23 ENCOUNTER — Other Ambulatory Visit: Payer: Self-pay

## 2017-11-23 ENCOUNTER — Encounter: Payer: Self-pay | Admitting: Radiation Oncology

## 2017-11-23 VITALS — BP 141/78 | HR 105 | Temp 98.9°F | Resp 24 | Ht 74.0 in | Wt 156.6 lb

## 2017-11-23 DIAGNOSIS — Z7982 Long term (current) use of aspirin: Secondary | ICD-10-CM | POA: Insufficient documentation

## 2017-11-23 DIAGNOSIS — Z923 Personal history of irradiation: Secondary | ICD-10-CM | POA: Diagnosis not present

## 2017-11-23 DIAGNOSIS — Z7984 Long term (current) use of oral hypoglycemic drugs: Secondary | ICD-10-CM | POA: Diagnosis not present

## 2017-11-23 DIAGNOSIS — C01 Malignant neoplasm of base of tongue: Secondary | ICD-10-CM | POA: Insufficient documentation

## 2017-11-23 DIAGNOSIS — I890 Lymphedema, not elsewhere classified: Secondary | ICD-10-CM | POA: Diagnosis not present

## 2017-11-23 DIAGNOSIS — Z931 Gastrostomy status: Secondary | ICD-10-CM | POA: Insufficient documentation

## 2017-11-23 DIAGNOSIS — Z930 Tracheostomy status: Secondary | ICD-10-CM | POA: Insufficient documentation

## 2017-11-23 MED ORDER — LARYNGOSCOPY SOLUTION RAD-ONC
15.0000 mL | Freq: Once | TOPICAL | Status: AC
  Administered 2017-11-23: 15 mL via TOPICAL
  Filled 2017-11-23: qty 15

## 2017-11-23 NOTE — Progress Notes (Signed)
Radiation Oncology         (336) [phone] ________________________________  Name: Brent Franklin MRN: [id number]  Date: 11/23/2017  DOB: Mar 02, 1931  Follow-Up Visit Note  CC: Prince Solian, MD  Olevia Bowens, MD  Diagnosis and Prior Radiotherapy:       ICD-10-CM   1. 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 He is s/p tracheostomy placed on 10/19/17. He last saw Dr. Wilburn Cornelia on 10/29/17 for follow up and adjustment. He reports seeing physical therapy for lymphedema.  He reports productive cough that has increased since surgery but denies hemoptysis. He states sleeping is more difficult.  He is scheduled to see Dr. Wilburn Cornelia this coming Tuesday, 11/27/17.  Pain issues, if any: He denies.  Using a feeding tube?: Yes, for all nutrition.   Weight changes, if any:  Wt Readings from Last 3 Encounters:  11/23/17 156 lb 9.6 oz (71 kg)  10/20/17 156 lb 4.8 oz (70.9 kg)  10/09/17 159 lb (72.1 kg)   Swallowing issues, if any: He is completely unable to swallow anything. His voice is hoarse.  Smoking or chewing tobacco?: No  Other notable issues, if any: He reports tissue sensitivity in his anterior neck.  ALLERGIES:  has No Known Allergies.  Meds: Current Outpatient Medications  Medication Sig Dispense Refill  . acetaminophen (TYLENOL) 500 MG tablet Take 1,000 mg by mouth every 6 (six)  hours as needed for moderate pain or headache.    Marland Kitchen aspirin 81 MG tablet Take 81 mg by mouth daily.    . metFORMIN (GLUCOPHAGE) 850 MG tablet Take 850 mg by mouth 2 (two) times daily with a meal.    . guaiFENesin-codeine 100-10 MG/5ML syrup Take 5 mLs by mouth every 6 (six) hours as needed for cough. (Patient not taking: Reported on 10/09/2017) 360 mL 3  . HYDROcodone-acetaminophen (HYCET) 7.5-325 mg/15 ml solution Take 10 mLs by mouth every 4 (four) hours as needed for moderate pain (or cough). (Patient not taking: Reported on 07/06/2017) 473 mL 0  . lidocaine (XYLOCAINE) 2 % solution Patient: Mix 1part 2% viscous lidocaine, 1part H20. Swallow 89mL of diluted mixture, 71min before meals and at bedtime, up to QID (Patient not taking: Reported on 07/06/2017) 100 mL 5  . zolpidem (AMBIEN) 10 MG tablet Take 1 tablet (10 mg total) by mouth at bedtime as needed. PRN insomnia. (Patient not taking: Reported on 11/23/2017) 30 tablet 3   No current facility-administered medications for this encounter.    Facility-Administered Medications Ordered in Other Encounters  Medication Dose Route Frequency Provider Last Rate Last Dose  . topical emolient (BIAFINE) emulsion   Topical BID Eppie Gibson, MD        Physical Findings: Wt Readings from Last 3 Encounters:  11/23/17 156 lb 9.6 oz (71 kg)  10/20/17 156 lb 4.8 oz (70.9 kg)  10/09/17 159 lb (72.1 kg)    height  is 6\' 2"  (1.88 m) and weight is 156 lb 9.6 oz (71 kg). His oral temperature is 98.9 F (37.2 C). His blood pressure is 141/78 (abnormal) and his pulse is 105 (abnormal). His respiration is 24 (abnormal) and oxygen saturation is 100%. General: Alert and oriented, in no acute distress. HEENT: Head is normocephalic. No lesions visible in the upper throat, thick saliva. Neck: Lymphedema present throughout the submandibular and submental areas. His trach is intact. No palpable adenopathy. Neurologic: No obvious focalities. Speech is fluent. Coordination  is intact. Psychiatric: Judgment and insight are intact. Affect is appropriate.  PROCEDURE NOTE: After obtaining consent and anesthetizing the nasal cavity with topical lidocaine and phenylephrine, the flexible endoscope was introduced and passed through the nasal cavity.  Supraglottic tumor (see photo) is progressive from previous exam.  Majority of airway is occluded. Tumor is exophytic with white mucous pooled above it. No other lesions appreciated in pharynx or larynx.  Scope could not pass beyond the tumor.    Lab Findings: Lab Results  Component Value Date   WBC 6.6 10/19/2017   HGB 12.4 (L) 10/19/2017   HCT 39.4 10/19/2017   MCV 96.3 10/19/2017   PLT 187 10/19/2017    Lab Results  Component Value Date   TSH 3.038 10/04/2017    Radiographic Findings: No results found.  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  We had a lengthy discussion about quality of life and his future goals. He feels as his affairs are in order, stating he'd be "okay" if he died next week. We discussed the option of enrolling in hospice (he is eligible due to declining further aggressive interventions and having terminal disease), but he does not feel ready for this.   2) Lymphedema: Patient is currently being seen by physical therapy.  3) Nutritional Status: Stable weight. Referral to Ernestene Kiel for advice on nutritional optimization (he is using supplements per PEG and has questions). PEG tube: Yes, using for all nutrition.  4) Swallowing: He is unable to swallow anything. Declines more SLP.  5) Thyroid function: He denies any symptoms of hypothyroidism and declines further labs. Free T4 was recently slightly low. We discussed this Lab Results  Component Value Date   TSH 3.038 10/04/2017   6) Other: emotional support given  today. His wife is also coping with cancer (lymphoma, Stage IV).  7) Follow-up in 6 weeks. He refused any further scans. The patient was encouraged to call with any issues or questions before then. F/U with ENT next week.  I spent 40 minutes face to face with the patient and more than 50% of that time was spent in counseling and/or coordination of care. _____________________________________   Eppie Gibson, MD  This document serves as a record of services personally performed by Eppie Gibson, MD. It was created on his behalf by Wilburn Mylar, a trained medical scribe. The creation of this record is based on the scribe's personal observations and the provider's statements to them. This document has been checked and approved by the attending provider.

## 2017-11-27 ENCOUNTER — Encounter: Payer: Self-pay | Admitting: Radiation Oncology

## 2017-11-27 ENCOUNTER — Telehealth: Payer: Self-pay | Admitting: *Deleted

## 2017-11-27 ENCOUNTER — Other Ambulatory Visit: Payer: Self-pay | Admitting: Radiation Oncology

## 2017-11-27 DIAGNOSIS — Z931 Gastrostomy status: Secondary | ICD-10-CM | POA: Diagnosis not present

## 2017-11-27 DIAGNOSIS — I10 Essential (primary) hypertension: Secondary | ICD-10-CM | POA: Diagnosis not present

## 2017-11-27 DIAGNOSIS — C029 Malignant neoplasm of tongue, unspecified: Secondary | ICD-10-CM | POA: Diagnosis not present

## 2017-11-27 DIAGNOSIS — Z923 Personal history of irradiation: Secondary | ICD-10-CM | POA: Diagnosis not present

## 2017-11-27 DIAGNOSIS — Z430 Encounter for attention to tracheostomy: Secondary | ICD-10-CM | POA: Diagnosis not present

## 2017-11-27 DIAGNOSIS — C321 Malignant neoplasm of supraglottis: Secondary | ICD-10-CM | POA: Diagnosis not present

## 2017-11-27 DIAGNOSIS — C100 Malignant neoplasm of vallecula: Secondary | ICD-10-CM

## 2017-11-27 DIAGNOSIS — C01 Malignant neoplasm of base of tongue: Secondary | ICD-10-CM | POA: Diagnosis not present

## 2017-11-27 DIAGNOSIS — Z930 Tracheostomy status: Secondary | ICD-10-CM | POA: Diagnosis not present

## 2017-11-27 DIAGNOSIS — E119 Type 2 diabetes mellitus without complications: Secondary | ICD-10-CM | POA: Diagnosis not present

## 2017-11-27 NOTE — Telephone Encounter (Signed)
THIS PATIENT WAS SEEN BY DR. Wilburn Cornelia ON 11-27-17 @ 10:20 AM

## 2017-11-28 ENCOUNTER — Telehealth: Payer: Self-pay | Admitting: Nutrition

## 2017-11-28 NOTE — Telephone Encounter (Signed)
Patient concerned with weight loss. He would like to add calories for weight stabilization. Recommend 1/2 Tablespoon olive or canola oil QID with Glucerna 1.5. Flush with warm water after each feeding.

## 2017-12-05 ENCOUNTER — Encounter: Payer: Self-pay | Admitting: Nutrition

## 2017-12-05 DIAGNOSIS — Z931 Gastrostomy status: Secondary | ICD-10-CM | POA: Diagnosis not present

## 2017-12-05 DIAGNOSIS — Z430 Encounter for attention to tracheostomy: Secondary | ICD-10-CM | POA: Diagnosis not present

## 2017-12-05 DIAGNOSIS — I10 Essential (primary) hypertension: Secondary | ICD-10-CM | POA: Diagnosis not present

## 2017-12-05 DIAGNOSIS — C321 Malignant neoplasm of supraglottis: Secondary | ICD-10-CM | POA: Diagnosis not present

## 2017-12-05 DIAGNOSIS — C029 Malignant neoplasm of tongue, unspecified: Secondary | ICD-10-CM | POA: Diagnosis not present

## 2017-12-05 DIAGNOSIS — E119 Type 2 diabetes mellitus without complications: Secondary | ICD-10-CM | POA: Diagnosis not present

## 2017-12-31 DIAGNOSIS — C01 Malignant neoplasm of base of tongue: Secondary | ICD-10-CM | POA: Diagnosis not present

## 2017-12-31 DIAGNOSIS — Z930 Tracheostomy status: Secondary | ICD-10-CM | POA: Diagnosis not present

## 2018-01-01 NOTE — Progress Notes (Signed)
Dr. Gaspar Bidding presents for follow up of radiation completed:  04/16/17 - 05/28/17: Oropharynx retreatment: 2 Gy x 30 fractions for a total dose of 60 Gy - retreatment 05/07/13 - 06/20/13: Base of tongue/bilateral neck / 66 Gy in 33 fractions (70 Gy in 35 fractions planned, but patient declined final 2 fractions due to side effects)  Pain issues, if any: No Using a feeding tube?: Yes, for all nutrition.  Weight changes, if any:  Wt Readings from Last 3 Encounters:  01/04/18 153 lb (69.4 kg)  11/23/17 156 lb 9.6 oz (71 kg)  10/20/17 156 lb 4.8 oz (70.9 kg)   Swallowing issues, if any: N/a Smoking or chewing tobacco? No Using fluoride trays daily? No Last ENT visit was on: Dr. Wilburn Cornelia 12/31/17, trach replacement.  Other notable issues, if any:   BP 137/80 (BP Location: Right Arm, Patient Position: Sitting)   Pulse 83   Temp 97.9 F (36.6 C) (Oral)   Resp 20   Ht 6\' 2"  (1.88 m)   Wt 153 lb (69.4 kg)   SpO2 100%   BMI 19.64 kg/m

## 2018-01-04 ENCOUNTER — Other Ambulatory Visit: Payer: Self-pay

## 2018-01-04 ENCOUNTER — Encounter: Payer: Self-pay | Admitting: Radiation Oncology

## 2018-01-04 ENCOUNTER — Ambulatory Visit
Admission: RE | Admit: 2018-01-04 | Discharge: 2018-01-04 | Disposition: A | Discharge status: Home / Self Care | Payer: Medicare Other | Source: Ambulatory Visit | Attending: Radiation Oncology | Admitting: Radiation Oncology

## 2018-01-04 DIAGNOSIS — I890 Lymphedema, not elsewhere classified: Secondary | ICD-10-CM | POA: Diagnosis not present

## 2018-01-04 DIAGNOSIS — C01 Malignant neoplasm of base of tongue: Secondary | ICD-10-CM | POA: Insufficient documentation

## 2018-01-04 DIAGNOSIS — G479 Sleep disorder, unspecified: Secondary | ICD-10-CM | POA: Diagnosis not present

## 2018-01-04 DIAGNOSIS — Z08 Encounter for follow-up examination after completed treatment for malignant neoplasm: Secondary | ICD-10-CM | POA: Diagnosis not present

## 2018-01-04 DIAGNOSIS — R634 Abnormal weight loss: Secondary | ICD-10-CM | POA: Diagnosis not present

## 2018-01-04 DIAGNOSIS — Z7984 Long term (current) use of oral hypoglycemic drugs: Secondary | ICD-10-CM | POA: Diagnosis not present

## 2018-01-04 DIAGNOSIS — Z931 Gastrostomy status: Secondary | ICD-10-CM | POA: Diagnosis not present

## 2018-01-04 DIAGNOSIS — Z9989 Dependence on other enabling machines and devices: Secondary | ICD-10-CM | POA: Diagnosis not present

## 2018-01-04 DIAGNOSIS — Z7982 Long term (current) use of aspirin: Secondary | ICD-10-CM | POA: Insufficient documentation

## 2018-01-04 MED ORDER — TEMAZEPAM 15 MG PO CAPS: 15.0000 mg | ORAL_CAPSULE | Freq: Every evening | ORAL | 0 refills | Status: DC | PRN

## 2018-01-04 NOTE — Progress Notes (Signed)
Radiation Oncology         (336) [phone] ________________________________  Name: Brent Franklin MRN: [id number]  Date: 01/04/2018  DOB: Feb 22, 1931  Follow-Up Visit Note  CC: Prince Solian, MD  Olevia Bowens, MD  Diagnosis and Prior Radiotherapy:       ICD-10-CM   1. 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    Pain issues, if any: No  Using a feeding tube?: Yes, for all nutrition.  Weight changes, if any:  Wt Readings from Last 3 Encounters:  01/04/18 153 lb (69.4 kg)  11/23/17 156 lb 9.6 oz (71 kg)  10/20/17 156 lb 4.8 oz (70.9 kg)   Swallowing issues, if any: chronic severe issues  Smoking or chewing tobacco?: No  Using fluoride trays daily? No  Last ENT visit was on: Dr. Wilburn Cornelia 12/31/17, trach replacement  Other notable issues, if any: Patient states he no longer drives. He is also unable to focus as well. He is not sleeping well and states he sleeps for about an hour then wakes up. This is not relieved by previously prescribed Ambien.   He is not yet ready for hospice enrollment. He will see his PCP for the first time in a while later this month.  Receiving PT for neck edema.  ALLERGIES:  has No Known Allergies.  Meds: Current Outpatient Medications  Medication Sig Dispense Refill  . acetaminophen (TYLENOL) 500 MG tablet Take 1,000 mg by mouth every 6 (six) hours as needed for moderate  pain or headache.    Marland Kitchen aspirin 81 MG tablet Take 81 mg by mouth daily.    . metFORMIN (GLUCOPHAGE) 850 MG tablet Take 850 mg by mouth 2 (two) times daily with a meal.    . guaiFENesin-codeine 100-10 MG/5ML syrup Take 5 mLs by mouth every 6 (six) hours as needed for cough. (Patient not taking: Reported on 10/09/2017) 360 mL 3  . HYDROcodone-acetaminophen (HYCET) 7.5-325 mg/15 ml solution Take 10 mLs by mouth every 4 (four) hours as needed for moderate pain (or cough). (Patient not taking: Reported on 07/06/2017) 473 mL 0  . lidocaine (XYLOCAINE) 2 % solution Patient: Mix 1part 2% viscous lidocaine, 1part H20. Swallow 67mL of diluted mixture, 24min before meals and at bedtime, up to QID (Patient not taking: Reported on 07/06/2017) 100 mL 5  . temazepam (RESTORIL) 15 MG capsule Take 1 capsule (15 mg total) by mouth at bedtime as needed for sleep. 30 capsule 0   No current facility-administered medications for this encounter.    Facility-Administered Medications Ordered in Other Encounters  Medication Dose Route Frequency Provider Last Rate Last Dose  . topical emolient (BIAFINE) emulsion   Topical BID Eppie Gibson, MD        Physical Findings: Wt Readings from Last 3 Encounters:  01/04/18 153 lb (69.4 kg)  11/23/17 156 lb 9.6 oz (71 kg)  10/20/17 156 lb 4.8 oz (70.9 kg)  height is 6\' 2"  (1.88 m) and weight is 153 lb (69.4 kg). His oral temperature is 97.9 F (36.6 C). His blood pressure is 137/80 and his pulse is 83. His respiration is 20 and oxygen saturation is 100%.   General: Alert and oriented, in no acute distress. He is unaccompanied today. HEENT: Head is normocephalic. No lesions visible in the upper throat, thick saliva. No thrush in the oral cavity. Neck: Lymphedema present throughout the submandibular and submental areas. His trach is intact. There is an odor, presumably from the necrotic tumor. No palpable adenopathy. Neurologic: No obvious focalities. Speech is fluent.    Psychiatric: Judgment and insight are intact. Affect is appropriate.   Lab Findings: Lab Results  Component Value Date   WBC 6.6 10/19/2017   HGB 12.4 (L) 10/19/2017   HCT 39.4 10/19/2017   MCV 96.3 10/19/2017   PLT 187 10/19/2017    Lab Results  Component Value Date   TSH 3.038 10/04/2017    Radiographic Findings: No results found.  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    2) Lymphedema: Patient is currently being seen by physical therapy.  3) Nutritional Status: slight weight loss. He is not interested in meeting with nutritionist. Discussed increasing PEG intake, as tolerated PEG tube: Yes, using for all nutrition.  4) Swallowing: He is unable to swallow anything. Declines more SLP.  5) Thyroid function: He denies any symptoms of hypothyroidism and declines further labs. Free T4 was recently slightly low. We discussed this Lab Results  Component Value Date   TSH 3.038 10/04/2017   6) Other: Emotional support given today. His wife is also coping with cancer (lymphoma, Stage IV).  Patient continues to have issues sleeping. This was not relieved with previously prescribed Ambien. Ambien prescription has been discontinued. I have ordered one month's worth of Restoril 15 mg today. The patient will call our office to let us know if this has helped or if the dose needs to be increased with next refill.  7) Follow-up in late-January. He refused any further scans. The patient was encouraged to call with any issues or questions before then. Follow up with PCP in near future. Hospice when he is ready - continue to discuss.  I spent at least 15 minutes face to face with the patient and more than 50% of that time was spent in counseling and/or coordination of  care. _____________________________________   Eppie Gibson, MD   This document serves as a record of services personally performed by Eppie Gibson, MD. It was created on her behalf by Arlyce Harman, a trained medical scribe. The creation of this record is based on the scribe's personal observations and the provider's statements to them. This document has been checked and approved by the attending provider.

## 2018-01-06 ENCOUNTER — Encounter: Payer: Self-pay | Admitting: Radiation Oncology

## 2018-01-07 ENCOUNTER — Telehealth: Payer: Self-pay | Admitting: *Deleted

## 2018-01-07 NOTE — Telephone Encounter (Signed)
CALLED PATIENT TO INFORM OF FU APPT. WITH DR. Isidore Moos ON 03-15-18 @ 2:40 PM, SPOKE WITH PATIENT'S WIFE - [name] AND SHE IS AWARE OF THIS APPT.

## 2018-01-10 ENCOUNTER — Emergency Department (HOSPITAL_COMMUNITY): Payer: Medicare Other

## 2018-01-10 ENCOUNTER — Other Ambulatory Visit: Payer: Self-pay

## 2018-01-10 ENCOUNTER — Encounter (HOSPITAL_COMMUNITY): Payer: Self-pay | Admitting: Emergency Medicine

## 2018-01-10 ENCOUNTER — Observation Stay (HOSPITAL_COMMUNITY)
Admission: EM | Admit: 2018-01-10 | Discharge: 2018-01-13 | Disposition: E | Payer: Medicare Other | Attending: Family Medicine | Admitting: Family Medicine

## 2018-01-10 DIAGNOSIS — C109 Malignant neoplasm of oropharynx, unspecified: Secondary | ICD-10-CM | POA: Insufficient documentation

## 2018-01-10 DIAGNOSIS — J80 Acute respiratory distress syndrome: Secondary | ICD-10-CM | POA: Diagnosis not present

## 2018-01-10 DIAGNOSIS — Z85818 Personal history of malignant neoplasm of other sites of lip, oral cavity, and pharynx: Secondary | ICD-10-CM | POA: Insufficient documentation

## 2018-01-10 DIAGNOSIS — Z430 Encounter for attention to tracheostomy: Secondary | ICD-10-CM

## 2018-01-10 DIAGNOSIS — Z7984 Long term (current) use of oral hypoglycemic drugs: Secondary | ICD-10-CM | POA: Insufficient documentation

## 2018-01-10 DIAGNOSIS — E785 Hyperlipidemia, unspecified: Secondary | ICD-10-CM | POA: Diagnosis not present

## 2018-01-10 DIAGNOSIS — J9501 Hemorrhage from tracheostomy stoma: Principal | ICD-10-CM

## 2018-01-10 DIAGNOSIS — Z8673 Personal history of transient ischemic attack (TIA), and cerebral infarction without residual deficits: Secondary | ICD-10-CM | POA: Diagnosis not present

## 2018-01-10 DIAGNOSIS — R042 Hemoptysis: Secondary | ICD-10-CM | POA: Diagnosis not present

## 2018-01-10 DIAGNOSIS — C01 Malignant neoplasm of base of tongue: Secondary | ICD-10-CM

## 2018-01-10 DIAGNOSIS — Z7289 Other problems related to lifestyle: Secondary | ICD-10-CM | POA: Diagnosis not present

## 2018-01-10 DIAGNOSIS — R58 Hemorrhage, not elsewhere classified: Secondary | ICD-10-CM | POA: Diagnosis not present

## 2018-01-10 DIAGNOSIS — R0902 Hypoxemia: Secondary | ICD-10-CM | POA: Diagnosis not present

## 2018-01-10 DIAGNOSIS — R1310 Dysphagia, unspecified: Secondary | ICD-10-CM | POA: Diagnosis not present

## 2018-01-10 DIAGNOSIS — I10 Essential (primary) hypertension: Secondary | ICD-10-CM | POA: Diagnosis not present

## 2018-01-10 DIAGNOSIS — E119 Type 2 diabetes mellitus without complications: Secondary | ICD-10-CM | POA: Diagnosis not present

## 2018-01-10 DIAGNOSIS — Z66 Do not resuscitate: Secondary | ICD-10-CM | POA: Insufficient documentation

## 2018-01-10 DIAGNOSIS — Z79899 Other long term (current) drug therapy: Secondary | ICD-10-CM | POA: Insufficient documentation

## 2018-01-10 DIAGNOSIS — Z931 Gastrostomy status: Secondary | ICD-10-CM | POA: Diagnosis not present

## 2018-01-10 DIAGNOSIS — Z85810 Personal history of malignant neoplasm of tongue: Secondary | ICD-10-CM | POA: Diagnosis not present

## 2018-01-10 DIAGNOSIS — G4700 Insomnia, unspecified: Secondary | ICD-10-CM | POA: Diagnosis not present

## 2018-01-10 LAB — GLUCOSE, CAPILLARY: Glucose-Capillary: 167 mg/dL — ABNORMAL HIGH (ref 70–99)

## 2018-01-10 MED ORDER — LORAZEPAM 1 MG PO TABS: 1.0000 mg | ORAL_TABLET | Freq: Once | ORAL | Status: DC | PRN

## 2018-01-10 MED ORDER — ZOLPIDEM TARTRATE 5 MG PO TABS: 5.0000 mg | ORAL_TABLET | Freq: Every evening | ORAL | Status: DC | PRN

## 2018-01-10 MED ORDER — TEMAZEPAM 15 MG PO CAPS: 15.0000 mg | ORAL_CAPSULE | Freq: Every evening | ORAL | 0 refills | Status: AC | PRN

## 2018-01-10 MED ORDER — METFORMIN HCL 850 MG PO TABS: 850.0000 mg | ORAL_TABLET | Freq: Two times a day (BID) | ORAL | 0 refills | Status: AC

## 2018-01-10 MED ORDER — MORPHINE SULFATE (PF) 2 MG/ML IV SOLN: 2.0000 mg | INTRAVENOUS | Status: DC | PRN

## 2018-01-10 MED ORDER — ENSURE ENLIVE PO LIQD
237.0000 mL | Freq: Three times a day (TID) | ORAL | Status: DC
  Administered 2018-01-10: 237 mL via ORAL

## 2018-01-10 MED ORDER — ENSURE IMMUNE HEALTH PO LIQD: 237.0000 mL | Freq: Three times a day (TID) | ORAL | 30 refills | Status: AC

## 2018-01-10 MED ORDER — ENSURE ENLIVE PO LIQD
237.0000 mL | Freq: Once | ORAL | Status: AC
  Administered 2018-01-10: 237 mL via ORAL
  Filled 2018-01-10: qty 237

## 2018-01-10 MED ORDER — ASPIRIN 81 MG PO TABS: 81.0000 mg | ORAL_TABLET | Freq: Every day | ORAL | 0 refills | Status: AC

## 2018-01-10 MED ORDER — LORAZEPAM 2 MG/ML IJ SOLN: 1.0000 mg | INTRAMUSCULAR | Status: DC | PRN

## 2018-01-10 MED ORDER — ACETAMINOPHEN 500 MG PO TABS: 1000.0000 mg | ORAL_TABLET | Freq: Four times a day (QID) | ORAL | 0 refills | Status: AC | PRN

## 2018-01-10 MED ORDER — SCOPOLAMINE 1 MG/3DAYS TD PT72: 1.0000 | MEDICATED_PATCH | TRANSDERMAL | Status: DC

## 2018-01-10 MED ORDER — ACETAMINOPHEN 160 MG/5ML PO SOLN
650.0000 mg | Freq: Four times a day (QID) | ORAL | Status: DC | PRN
  Administered 2018-01-10: 650 mg
  Filled 2018-01-10: qty 20.3

## 2018-01-10 MED ORDER — FREE WATER
200.0000 mL | Freq: Three times a day (TID) | Status: DC
  Administered 2018-01-10 – 2018-01-11 (×2): 200 mL

## 2018-01-10 MED ORDER — TEMAZEPAM 15 MG PO CAPS
15.0000 mg | ORAL_CAPSULE | Freq: Every evening | ORAL | Status: DC | PRN
  Filled 2018-01-10: qty 1

## 2018-01-10 NOTE — ED Notes (Signed)
Attempted report 

## 2018-01-10 NOTE — ED Provider Notes (Signed)
Care was taken over from Dr. Dayna Barker.  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  Dr. Erik Obey scoped him and found a clot.  Patient wants only comfort care measures.  He does not want any lab work or monitoring.  He is just concerned about potentially dying at home in front of his wife.  We did contact [facility] about trying to escalate his care to skilled nursing but they are unable to do that until tomorrow.  [facility]osp[facility] palliative [facility] has also been consulted and can see him tomorrow better unable to initiate a consult today.  I did consult Dr. Nevada Crane with the hospitalist service who will admit the patient for comfort care measures until he can be transported back to skilled nursing at [facility] with hospice care.   [name], MD 01/03/2018 1606

## 2018-01-10 NOTE — Consult Note (Signed)
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  Had HPV+ BOT Ca.  Treated with 2 courses of radiation.  Known vallecular/epiglottic recurrence.  Yesterday was ambulatory, voice better.  This AM began  Coughing up blood by mouth and from his trach stoma.  He cannot swallow and is taking nutrition per G-tube.  He has a #4 cuffless trach tube.  He describes "air hunger"  Today for the first time.  He does not think he has CHF or aspiration.  No pain.    PMH: Past Medical History:  Diagnosis Date  . Cataract   . Diabetes mellitus without complication ([place])   . 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 Hyperlipidemia   . Hypertension   . Inguinal hernia   . Insomnia 05/06/2013  . Rash 05/14/2013  . S/P radiation therapy 05/07/13- 06/20/13   Base of tongue/bilat neck 66 Gy, 33 fx  . S/P radiation therapy 04/16/17- 05/28/17   Oropharynx, 2 Gy in 30 fractions for a total dose of 60 Gy  . Stroke [facility]) 2001   eye  . Tongue cancer ([place]) 04/18/2013   pharyngeal   . Tuberculosis    + PPD NO [place]   . Uses feeding tube     Surg Hx: Past Surgical History:  Procedure Laterality Date  . EYE SURGERY  2000, 2001   cataract  . INGUINAL HERNIA REPAIR  02/13/2012   Procedure: HERNIA REPAIR INGUINAL ADULT;  Surgeon: Adin Hector, MD;  Location: [facility];  Service: General;  Laterality: Right;  . INSERTION OF MESH  02/13/2012   Procedure: INSERTION OF MESH;  Surgeon: Adin Hector, MD;  Location: [facility];  Service: General;  Laterality: N/A;  . LAPAROSCOPIC GASTROSTOMY N/A 05/05/2013   Procedure: LAPAROSCOPIC GASTROSTOMY;  Surgeon: Adin Hector, MD;  Location: [place] [facility];  Service: General;  Laterality: N/A;  . PORTACATH PLACEMENT N/A 05/05/2013   Procedure: INSERTION PORT-A-CATH;  Surgeon: Adin Hector, MD;  Location: [facility];  Service: General;  Laterality: N/A;  . TRACHEOSTOMY TUBE PLACEMENT N/A 10/19/2017   Procedure:  TRACHEOSTOMY;  Surgeon: Jerrell Belfast, MD;  Location: [facility];  Service: ENT;  Laterality: N/A;    FHx:   Family History  Problem Relation Age of Onset  . Cancer Mother 84       breast ca  . Stroke Father   . Cancer Brother 14       prostate ca  . Heart disease Brother    SocHx:  reports that he has never smoked. He has never used smokeless tobacco. He reports that he drank alcohol. He reports that he does not use drugs.  ALLERGIES: No Known Allergies   (Not in a hospital admission)  No results found for this or any previous visit (from the past 48 hour(s)). No results found.    Blood pressure (!) 172/96, pulse (!) 119, resp. rate (!) 28, SpO2 98 %.  PHYSICAL EXAM: Overall appearance:  Cachectic. Mental status sharp.  Breathing freely through his tracheostomy tube.   Head: NCAT Ears:  Not examined Nose: LEFT DNG Oral Cavity:  Old blood. Oral Pharynx/Hypopharynx/Larynx:  Per flexible scope, NP clear.  OP with pedunculated clot flapping up and down over larynx.  Could not see larynx proper.  Irregular tissue beneath the clot, but difficult to orient.  No active bleeding site visualized Neuro: grossly intact Neck:  Brent Franklin  in place.  Heavy granulation under trach flanges.  The flexible scope was placed through the trach tube with clear airway to the carina.  The trach tube was removed and trachea is clear.  Unable to satisfactorily retroflex the tube into the subglottis.    Studies Reviewed:  none    Assessment/Plan Recurrent and terminal oropharyngeal cancer.  Probable tumor bleeding.  No airway obstruction to explain subjective air hunger.  Plan:  We could go to the OR to attempt to cauterize a bleeding site.  We could ask IR to embolize LEFT pharyngeal feeding vessels.  I could probably place a larger trach tube.  He could simply go home and anticipate additional episodes of bleeding leading to his demise.  He would like to try to enter the skilled nursing unit under  Hospice at [facility].   We will work on making these arrangements.  Ileene Hutchinson [facility] 00/34/9179, 1:50 PM

## 2018-01-10 NOTE — Progress Notes (Signed)
Pt w/ hemoptysis= coughing blood through trach and out of mouth. Pt states he's getting "enough air", cleaned out inner cannula d/t bloody secretions starting to line inner cannula.  I did not deep suction, but did suction length of trach only to ensure patent airway.  Sat 97% on room air.  No distress currently noted.

## 2018-01-10 NOTE — H&P (Signed)
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  Started coughing persistently today which turned into hemoptysis.  Associated with dyspnea at rest, air hunger.  Seen in the ED by ENT Dr. Erik Obey for possible cauterization of bleeding site but patient refuses all medical intervention to prolong his life.  He wants comfort measures only.  Patient is alert and oriented x4.  He is a retired MD.  Made decision to withdraw all care and testings except for comfort care.   Patient would like to go to hospice at [facility] skilled nursing unit.  These arrangements will be made by social worker who has been consulted.  ED Course: Patient refuses all medical intervention and made the decision to be comfort measures only.  Review of Systems: Review of systems as noted in the HPI. All other systems reviewed and are negative.   Past Medical History:  Diagnosis Date  . Cataract   . Diabetes mellitus without complication ([place])   . 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 Hyperlipidemia   . Hypertension   . Inguinal hernia   . Insomnia 05/06/2013  . Rash 05/14/2013  . S/P radiation therapy 05/07/13- 06/20/13   Base of tongue/bilat neck 66 Gy, 33 fx  . S/P radiation therapy 04/16/17- 05/28/17   Oropharynx, 2 Gy in 30 fractions for a total dose of 60 Gy  . Stroke [facility]) 2001   eye  . 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 Tuberculosis    + PPD NO TX   . Uses feeding tube    Past Surgical  History:  Procedure Laterality Date  . EYE SURGERY  2000, 2001   cataract  . INGUINAL HERNIA REPAIR  02/13/2012   Procedure: HERNIA REPAIR INGUINAL ADULT;  Surgeon: Adin Hector, MD;  Location: [place];  Service: General;  Laterality: Right;  . INSERTION OF MESH  02/13/2012   Procedure: INSERTION OF MESH;  Surgeon: Adin Hector, MD;  Location: [place];  Service: General;  Laterality: N/A;  . LAPAROSCOPIC GASTROSTOMY N/A 05/05/2013   Procedure: LAPAROSCOPIC GASTROSTOMY;  Surgeon: Adin Hector, MD;  Location: [employer] [employer];  Service: General;  Laterality: N/A;  . PORTACATH PLACEMENT N/A 05/05/2013   Procedure: INSERTION PORT-A-CATH;  Surgeon: Adin Hector, MD;  Location: [place];  Service: General;  Laterality: N/A;  . TRACHEOSTOMY TUBE PLACEMENT N/A 10/19/2017   Procedure: TRACHEOSTOMY;  Surgeon: Jerrell Belfast, MD;  Location: [place];  Service: ENT;  Laterality: N/A;    Social History:  reports that he has never smoked. He has never used smokeless tobacco. He reports that he drank alcohol. He reports that he does not use drugs.   No Known Allergies  Family History  Problem Relation Age of Onset  . Cancer Mother 75       breast ca  . Stroke Father   . Cancer Brother 83       prostate ca  . Heart disease Brother       Prior to Admission medications   Medication  Sig Start Date End Date Taking? Authorizing Provider  acetaminophen (TYLENOL) 500 MG tablet Place 2 tablets (1,000 mg total) into feeding tube every 6 (six) hours as needed for moderate pain or headache. 12/20/2017   Mesner, Corene Cornea, MD  aspirin 81 MG tablet Place 1 tablet (81 mg total) into feeding tube daily. 12/28/2017   Mesner, Corene Cornea, MD  feeding supplement (ENSURE IMMUNE HEALTH) LIQD Take 237 mLs by mouth 3 (three) times daily with meals. 01/09/2018   Mesner, Corene Cornea, MD  metFORMIN (GLUCOPHAGE) 850 MG tablet Place 1 tablet (850 mg total) into feeding tube 2 (two) times daily with a meal. 12/15/2017   Mesner, Corene Cornea, MD  temazepam  (RESTORIL) 15 MG capsule Place 1 capsule (15 mg total) into feeding tube at bedtime as needed for sleep. 01/08/2018   Mesner, Corene Cornea, MD    Physical Exam: BP 125/65 (BP Location: Right Arm)   Pulse (!) 109   Resp 20   SpO2 95%   . General: 84 y.o. year-old male frail, ill-appearing in no acute distress.  Alert and oriented x4. . Cardiovascular: Regular rate and rhythm with no rubs or gallops.  No thyromegaly or JVD noted.  No lower extremity edema. 2/4 pulses in all 4 extremities. Marland Kitchen Respiratory: Diffuse rales bilaterally with no wheezes. Good inspiratory effort. . Abdomen: Soft nontender nondistended with normal bowel sounds x4 quadrants.  PEG tube in place. . Muskuloskeletal: No cyanosis, clubbing or edema noted bilaterally . Neuro: CN II-XII intact, strength, sensation, reflexes . Skin: No ulcerative lesions noted or rashes . Psychiatry: Judgement and insight appear normal. Mood is appropriate for condition and setting          Labs on Admission:  Basic Metabolic Panel: No results for input(s): NA, K, CL, CO2, GLUCOSE, BUN, CREATININE, CALCIUM, MG, PHOS in the last 168 hours. Liver Function Tests: No results for input(s): AST, ALT, ALKPHOS, BILITOT, PROT, ALBUMIN in the last 168 hours. No results for input(s): LIPASE, AMYLASE in the last 168 hours. No results for input(s): AMMONIA in the last 168 hours. CBC: No results for input(s): WBC, NEUTROABS, HGB, HCT, MCV, PLT in the last 168 hours. Cardiac Enzymes: No results for input(s): CKTOTAL, CKMB, CKMBINDEX, TROPONINI in the last 168 hours.  BNP (last 3 results) No results for input(s): BNP in the last 8760 hours.  ProBNP (last 3 results) No results for input(s): PROBNP in the last 8760 hours.  CBG: No results for input(s): GLUCAP in the last 168 hours.  Radiological Exams on Admission: No results found.  EKG: I independently viewed the EKG done and my findings are as followed: None done at the time of this assessment.   Patient declines all testings.  Assessment/Plan Present on Admission: . Oropharyngeal cancer (HCC)  Active Problems:   Oropharyngeal cancer (HCC)  Terminal oropharyngeal cancer, tracheostomy dependent Patient refuses all care to prolong his life. Wants to be comfort measures only Start IV morphine and IV Ativan as needed for comfort Scopolamine patch for secretions Patient specifically requested Ensure via Peg tube 3 times daily No lab testings, no imagings  Patient is DNR comfort measures only Withdraw all means of treatment to prolong life.  Social worker consulted to assist with hospice placement at SNF.  High risk for deterioration and death due to withdrawal of all treatments.  DVT prophylaxis: None  Code Status: DNR, CMO   Family Communication: None at bedside   Disposition Plan: Admit to Med Surg unit  Consults called: ENT Dr Erik Obey by ED provider  Admission status: Observation     Kayleen Memos MD [facility] Pager [phone]  If 7PM-7AM, please contact night-coverage [email or web address] Password [facility]  12/14/2017, 5:36 PM

## 2018-01-10 NOTE — ED Notes (Signed)
Pt refusing to be placed on monitor.   Also refusing IV placement.

## 2018-01-10 NOTE — ED Notes (Addendum)
ED Provider at bedside. ENT provider at bedside.

## 2018-01-10 NOTE — Progress Notes (Signed)
Came to check on pt, pt is awake and does not want any interventions or suctioning now.  Pt is now off the monitor.

## 2018-01-10 NOTE — ED Triage Notes (Signed)
Pt BIB EMS from home with c/o hemorrhaging from mouth. Pt reports a tumor may have ruptured.  EMS reports removal of a clot the size of a golf-ball.  Pt had Trach placed in September 2019 although not ventilation dependent. EMS Vitals:  BP 160/100, Sp02 95% on RA, HR 120, RR 16.

## 2018-01-11 DIAGNOSIS — C01 Malignant neoplasm of base of tongue: Secondary | ICD-10-CM | POA: Diagnosis not present

## 2018-01-11 DIAGNOSIS — J9501 Hemorrhage from tracheostomy stoma: Secondary | ICD-10-CM | POA: Diagnosis not present

## 2018-01-11 DIAGNOSIS — C109 Malignant neoplasm of oropharynx, unspecified: Secondary | ICD-10-CM | POA: Diagnosis not present

## 2018-01-11 DIAGNOSIS — Z430 Encounter for attention to tracheostomy: Secondary | ICD-10-CM

## 2018-01-13 NOTE — Progress Notes (Signed)
CSW received call from MD on yesterday requesting further information on getting pt back to [facility] and getting Hospice Care established for pt once at [facility]. CSW made aware by Zacarias Pontes [phone] (Administrator) that pt is from the ILF of [facility] and not the SNF portion. Vivien Rota expressed that they were looking into getting pt into the SNF portion of [facility] however wouldn't be able to take pt until Friday Feb 08, 2018. Vivien Rota requested that Hospice be set up for pt at [place] in order to come to SNF.    CSW spoke with Anderson Malta on call nurse for Hospice and [place] and was informed that they would prefer to take referral on pt and the follow up with pt once at [facility] as Anderson Malta reports that they usually do not come to the ED for referrals. CSW advised her that from CSW's knowledge and per report of MD pt would be transported back to [facility] ILF. Anderson Malta expressed that she would take pt's name, DOB, and contact information. Anderson Malta expressed that she would submit information to intake to reach out to pt on 08-Feb-2018.     Virgie Dad Verdia Bolt, MSW, [place] [facility] Clinical Social Worker [phone]

## 2018-01-13 NOTE — Progress Notes (Signed)
Pt coughing out blood, blood coming out from mouth and nose. Offered pt to suction the trache, pt refused. Cleaned the inner canula. Gauze changed. Peg tube gauze changed. Cleaned up pt and complete bed linen changed. Pt refuses oxygen or anxiety or pain med at this time.

## 2018-01-13 NOTE — Social Work (Signed)
CSW has contacted [facility] representative and [facility] to inform them that pt has passed. Bedside RN informed CSW that pt family is aware and will come get pt belongings.   CSW signing off.  Alexander Mt, [place] Work [phone]

## 2018-01-13 NOTE — Progress Notes (Signed)
Pt found lying in bed with weak pulse and respiratory distress. Pt not responding. Paged MD on call, orders for comfort measures ordered. Pt expired eventually at 0730. Notified MD, Notified family.

## 2018-01-13 NOTE — Death Summary Note (Signed)
Death Summary  Brent Franklin TNZ:[id number] DOB: 1932-02-08 DOA: 01-12-2018  PCP: Prince Solian, MD   Admit date: 12-Jan-2018 Date of Death: January 13, 2018  Final Diagnoses:  Active Problems:   Oropharyngeal cancer ([place])   Tracheostomy care [facility])   Tracheostomy hemorrhage ([place])    1. Terminal oropharyngeal cancer 2. 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  Hospital Course:  Patient was admitted with comfort measures only, did not want to prolong his life.  Started on morphine and Ativan as needed for comfort.  Patient expired this morning at 7:30 AM.   Time: Time of death 7:30 AM.  Signed:  Oswald Hillock  [facility] 01/13/2018, 8:07 AM

## 2018-01-13 NOTE — Progress Notes (Addendum)
Checked on pt. Pt sitting up on the side of the bed. Pt noted to be short of breath. Offered oxygen, pt refused. Refused to suction trach as well. Offered to get an IV and take ativan for relief, pt refused. Pt instructed to call for assistance.

## 2018-01-13 DEATH — deceased

## 2018-03-15 ENCOUNTER — Ambulatory Visit: Payer: Self-pay | Admitting: Radiation Oncology
